# Patient Record
Sex: Male | Born: 1935 | Race: White | Hispanic: No | Smoking: Never smoker
Health system: Southern US, Community
[De-identification: ages and names within clinical notes are randomized; demographics above are authoritative.]

## PROBLEM LIST (undated history)

## (undated) DIAGNOSIS — C4491 Basal cell carcinoma of skin, unspecified: Secondary | ICD-10-CM

## (undated) DIAGNOSIS — E785 Hyperlipidemia, unspecified: Secondary | ICD-10-CM

## (undated) DIAGNOSIS — I4891 Unspecified atrial fibrillation: Secondary | ICD-10-CM

## (undated) DIAGNOSIS — E119 Type 2 diabetes mellitus without complications: Secondary | ICD-10-CM

## (undated) DIAGNOSIS — Z951 Presence of aortocoronary bypass graft: Secondary | ICD-10-CM

## (undated) DIAGNOSIS — I5030 Unspecified diastolic (congestive) heart failure: Secondary | ICD-10-CM

## (undated) DIAGNOSIS — I2510 Atherosclerotic heart disease of native coronary artery without angina pectoris: Secondary | ICD-10-CM

## (undated) DIAGNOSIS — I10 Essential (primary) hypertension: Secondary | ICD-10-CM

## (undated) DIAGNOSIS — C801 Malignant (primary) neoplasm, unspecified: Secondary | ICD-10-CM

## (undated) DIAGNOSIS — H9190 Unspecified hearing loss, unspecified ear: Secondary | ICD-10-CM

## (undated) DIAGNOSIS — Z973 Presence of spectacles and contact lenses: Secondary | ICD-10-CM

## (undated) DIAGNOSIS — I219 Acute myocardial infarction, unspecified: Secondary | ICD-10-CM

## (undated) HISTORY — PX: CORONARY ARTERY BYPASS GRAFT: SHX141

---

## 1999-02-25 DIAGNOSIS — I5030 Unspecified diastolic (congestive) heart failure: Secondary | ICD-10-CM

## 1999-02-25 HISTORY — DX: Unspecified diastolic (congestive) heart failure: I50.30

## 1999-04-29 ENCOUNTER — Inpatient Hospital Stay (HOSPITAL_COMMUNITY): Admission: EM | Admit: 1999-04-29 | Discharge: 1999-05-04 | Payer: Self-pay | Admitting: *Deleted

## 1999-04-29 ENCOUNTER — Encounter: Payer: Self-pay | Admitting: Thoracic Surgery (Cardiothoracic Vascular Surgery)

## 1999-04-30 ENCOUNTER — Encounter: Payer: Self-pay | Admitting: Thoracic Surgery (Cardiothoracic Vascular Surgery)

## 1999-05-01 ENCOUNTER — Encounter: Payer: Self-pay | Admitting: Thoracic Surgery (Cardiothoracic Vascular Surgery)

## 1999-05-02 ENCOUNTER — Encounter: Payer: Self-pay | Admitting: Thoracic Surgery (Cardiothoracic Vascular Surgery)

## 2000-02-25 DIAGNOSIS — Z951 Presence of aortocoronary bypass graft: Secondary | ICD-10-CM

## 2000-02-25 DIAGNOSIS — I2510 Atherosclerotic heart disease of native coronary artery without angina pectoris: Secondary | ICD-10-CM

## 2000-02-25 HISTORY — DX: Presence of aortocoronary bypass graft: Z95.1

## 2000-02-25 HISTORY — DX: Atherosclerotic heart disease of native coronary artery without angina pectoris: I25.10

## 2005-05-05 ENCOUNTER — Inpatient Hospital Stay: Payer: Self-pay | Admitting: Internal Medicine

## 2005-05-05 ENCOUNTER — Other Ambulatory Visit: Payer: Self-pay

## 2010-05-26 ENCOUNTER — Ambulatory Visit: Payer: Self-pay | Admitting: Internal Medicine

## 2010-06-07 ENCOUNTER — Inpatient Hospital Stay: Payer: Self-pay | Admitting: Surgery

## 2010-06-12 LAB — PSA

## 2010-06-12 LAB — CEA: CEA: 1.1 ng/mL (ref 0.0–4.7)

## 2010-06-16 LAB — PATHOLOGY REPORT

## 2010-06-20 ENCOUNTER — Ambulatory Visit: Payer: Self-pay | Admitting: Internal Medicine

## 2010-06-25 ENCOUNTER — Ambulatory Visit: Payer: Self-pay | Admitting: Internal Medicine

## 2010-07-26 ENCOUNTER — Ambulatory Visit: Payer: Self-pay | Admitting: Internal Medicine

## 2010-08-25 ENCOUNTER — Ambulatory Visit: Payer: Self-pay | Admitting: Internal Medicine

## 2010-09-25 ENCOUNTER — Ambulatory Visit: Payer: Self-pay | Admitting: Internal Medicine

## 2010-10-25 ENCOUNTER — Ambulatory Visit: Payer: Self-pay | Admitting: Internal Medicine

## 2010-10-26 ENCOUNTER — Ambulatory Visit: Payer: Self-pay | Admitting: Internal Medicine

## 2010-11-25 ENCOUNTER — Ambulatory Visit: Payer: Self-pay | Admitting: Internal Medicine

## 2010-12-26 ENCOUNTER — Ambulatory Visit: Payer: Self-pay | Admitting: Internal Medicine

## 2011-01-25 ENCOUNTER — Ambulatory Visit: Payer: Self-pay | Admitting: Internal Medicine

## 2011-02-03 ENCOUNTER — Ambulatory Visit: Payer: Self-pay | Admitting: Family Medicine

## 2011-02-10 ENCOUNTER — Inpatient Hospital Stay: Payer: Self-pay | Admitting: Surgery

## 2011-02-25 ENCOUNTER — Ambulatory Visit: Payer: Self-pay | Admitting: Internal Medicine

## 2011-03-07 ENCOUNTER — Ambulatory Visit: Payer: Self-pay | Admitting: Internal Medicine

## 2011-03-07 LAB — HEPATIC FUNCTION PANEL A (ARMC)
Bilirubin, Direct: 0.1 mg/dL (ref 0.00–0.20)
Bilirubin,Total: 0.3 mg/dL (ref 0.2–1.0)
SGOT(AST): 34 U/L (ref 15–37)
SGPT (ALT): 33 U/L
Total Protein: 6.4 g/dL (ref 6.4–8.2)

## 2011-03-07 LAB — CREATININE, SERUM
Creatinine: 0.98 mg/dL (ref 0.60–1.30)
EGFR (African American): >60
EGFR (Non-African Amer.): >60

## 2011-03-07 LAB — CBC CANCER CENTER
Basophil %: 0.4 %
Eosinophil #: 0.5 x10 3/mm (ref 0.0–0.7)
Eosinophil %: 8.4 %
HCT: 35.3 % — ABNORMAL LOW (ref 40.0–52.0)
MCH: 28.6 pg (ref 26.0–34.0)
MCHC: 33.9 g/dL (ref 32.0–36.0)
MCV: 85 fL (ref 80–100)
Monocyte #: 0.6 x10 3/mm (ref 0.0–0.7)
Neutrophil #: 2 x10 3/mm (ref 1.4–6.5)
Neutrophil %: 32.5 %
Platelet: 276 x10 3/mm (ref 150–440)

## 2011-03-07 LAB — LACTATE DEHYDROGENASE: LDH: 165 U/L (ref 87–241)

## 2011-03-28 ENCOUNTER — Ambulatory Visit: Payer: Self-pay | Admitting: Internal Medicine

## 2011-04-25 ENCOUNTER — Ambulatory Visit: Payer: Self-pay | Admitting: Internal Medicine

## 2011-05-30 ENCOUNTER — Ambulatory Visit: Payer: Self-pay | Admitting: Internal Medicine

## 2011-05-30 LAB — CREATININE, SERUM: Creatinine: 1.03 mg/dL (ref 0.60–1.30)

## 2011-05-30 LAB — HEPATIC FUNCTION PANEL A (ARMC)
Albumin: 3.6 g/dL (ref 3.4–5.0)
Alkaline Phosphatase: 72 U/L (ref 50–136)
Bilirubin, Direct: 0 mg/dL (ref 0.00–0.20)
Bilirubin,Total: 0.2 mg/dL (ref 0.2–1.0)
SGPT (ALT): 25 U/L
Total Protein: 6.7 g/dL (ref 6.4–8.2)

## 2011-05-30 LAB — CBC CANCER CENTER
Eosinophil #: 0.2 x10 3/mm (ref 0.0–0.7)
Eosinophil %: 4.2 %
HGB: 13.1 g/dL (ref 13.0–18.0)
MCH: 30.1 pg (ref 26.0–34.0)
MCHC: 34.5 g/dL (ref 32.0–36.0)
Monocyte #: 0.5 x10 3/mm (ref 0.0–0.7)
Neutrophil #: 1.8 x10 3/mm (ref 1.4–6.5)
Platelet: 192 x10 3/mm (ref 150–440)
RDW: 13.2 % (ref 11.5–14.5)

## 2011-05-30 LAB — LACTATE DEHYDROGENASE: LDH: 219 U/L (ref 87–241)

## 2011-06-25 ENCOUNTER — Ambulatory Visit: Payer: Self-pay | Admitting: Internal Medicine

## 2011-07-23 LAB — BASIC METABOLIC PANEL
Anion Gap: 10 (ref 7–16)
BUN: 11 mg/dL (ref 7–18)
Co2: 26 mmol/L (ref 21–32)
EGFR (African American): >60
EGFR (Non-African Amer.): >60
Osmolality: 284 (ref 275–301)
Potassium: 4.3 mmol/L (ref 3.5–5.1)
Sodium: 141 mmol/L (ref 136–145)

## 2011-07-23 LAB — CBC CANCER CENTER
HGB: 13.1 g/dL (ref 13.0–18.0)
Lymphocyte #: 3.6 x10 3/mm (ref 1.0–3.6)
MCH: 29.9 pg (ref 26.0–34.0)
MCHC: 33.5 g/dL (ref 32.0–36.0)
MCV: 89 fL (ref 80–100)
Monocyte #: 0.5 x10 3/mm (ref 0.2–1.0)
Monocyte %: 6.4 %
Neutrophil #: 2.6 x10 3/mm (ref 1.4–6.5)
Platelet: 219 x10 3/mm (ref 150–440)
RBC: 4.38 10*6/uL — ABNORMAL LOW (ref 4.40–5.90)
WBC: 7.2 x10 3/mm (ref 3.8–10.6)

## 2011-07-26 ENCOUNTER — Ambulatory Visit: Payer: Self-pay | Admitting: Internal Medicine

## 2011-08-25 ENCOUNTER — Ambulatory Visit: Payer: Self-pay | Admitting: Internal Medicine

## 2011-10-03 ENCOUNTER — Ambulatory Visit: Payer: Self-pay | Admitting: Internal Medicine

## 2011-10-03 LAB — CBC CANCER CENTER
Basophil #: 0 x10 3/mm (ref 0.0–0.1)
Basophil %: 0.3 %
Eosinophil #: 0.2 x10 3/mm (ref 0.0–0.7)
Eosinophil %: 3.6 %
HCT: 36.2 % — ABNORMAL LOW (ref 40.0–52.0)
HGB: 12.2 g/dL — ABNORMAL LOW (ref 13.0–18.0)
Lymphocyte #: 2.6 x10 3/mm (ref 1.0–3.6)
Lymphocyte %: 39.3 %
MCH: 30.3 pg (ref 26.0–34.0)
MCHC: 33.8 g/dL (ref 32.0–36.0)
MCV: 90 fL (ref 80–100)
Monocyte #: 0.5 x10 3/mm (ref 0.2–1.0)
Monocyte %: 7.9 %
Neutrophil #: 3.2 x10 3/mm (ref 1.4–6.5)
Neutrophil %: 48.9 %
Platelet: 197 x10 3/mm (ref 150–440)
RBC: 4.03 10*6/uL — ABNORMAL LOW (ref 4.40–5.90)
RDW: 13.4 % (ref 11.5–14.5)
WBC: 6.6 x10 3/mm (ref 3.8–10.6)

## 2011-10-03 LAB — HEPATIC FUNCTION PANEL A (ARMC)
Albumin: 3.4 g/dL (ref 3.4–5.0)
Alkaline Phosphatase: 65 U/L (ref 50–136)
Bilirubin, Direct: <0.1 mg/dL (ref 0.00–0.20)
Bilirubin,Total: 0.2 mg/dL (ref 0.2–1.0)
SGOT(AST): 24 U/L (ref 15–37)
SGPT (ALT): 27 U/L (ref 12–78)
Total Protein: 6.7 g/dL (ref 6.4–8.2)

## 2011-10-03 LAB — LACTATE DEHYDROGENASE: LDH: 160 U/L (ref 81–234)

## 2011-10-03 LAB — CREATININE, SERUM
Creatinine: 0.98 mg/dL (ref 0.60–1.30)
EGFR (African American): >60
EGFR (Non-African Amer.): >60

## 2011-10-26 ENCOUNTER — Ambulatory Visit: Payer: Self-pay | Admitting: Internal Medicine

## 2011-11-25 ENCOUNTER — Ambulatory Visit: Payer: Self-pay | Admitting: Internal Medicine

## 2011-12-26 ENCOUNTER — Ambulatory Visit: Payer: Self-pay | Admitting: Internal Medicine

## 2012-01-25 ENCOUNTER — Ambulatory Visit: Payer: Self-pay | Admitting: Internal Medicine

## 2012-02-02 DIAGNOSIS — Z23 Encounter for immunization: Secondary | ICD-10-CM | POA: Insufficient documentation

## 2012-02-02 DIAGNOSIS — R300 Dysuria: Secondary | ICD-10-CM | POA: Insufficient documentation

## 2012-02-02 DIAGNOSIS — R399 Unspecified symptoms and signs involving the genitourinary system: Secondary | ICD-10-CM | POA: Insufficient documentation

## 2012-02-02 DIAGNOSIS — N401 Enlarged prostate with lower urinary tract symptoms: Secondary | ICD-10-CM | POA: Insufficient documentation

## 2012-02-02 DIAGNOSIS — R351 Nocturia: Secondary | ICD-10-CM | POA: Insufficient documentation

## 2012-02-02 DIAGNOSIS — R339 Retention of urine, unspecified: Secondary | ICD-10-CM | POA: Insufficient documentation

## 2012-02-02 DIAGNOSIS — N138 Other obstructive and reflux uropathy: Secondary | ICD-10-CM

## 2012-02-06 LAB — CREATININE, SERUM
Creatinine: 1.14 mg/dL (ref 0.60–1.30)
EGFR (African American): >60
EGFR (Non-African Amer.): 60 — ABNORMAL LOW

## 2012-02-06 LAB — HEPATIC FUNCTION PANEL A (ARMC)
Alkaline Phosphatase: 75 U/L (ref 50–136)
SGOT(AST): 24 U/L (ref 15–37)
SGPT (ALT): 31 U/L (ref 12–78)

## 2012-02-06 LAB — CBC CANCER CENTER
Basophil #: 0 x10 3/mm (ref 0.0–0.1)
Eosinophil #: 0.3 x10 3/mm (ref 0.0–0.7)
HCT: 36.2 % — ABNORMAL LOW (ref 40.0–52.0)
Lymphocyte #: 2.7 x10 3/mm (ref 1.0–3.6)
MCHC: 34.7 g/dL (ref 32.0–36.0)
MCV: 89 fL (ref 80–100)
Monocyte #: 0.5 x10 3/mm (ref 0.2–1.0)
Neutrophil %: 45.5 %
Platelet: 187 x10 3/mm (ref 150–440)
RBC: 4.08 10*6/uL — ABNORMAL LOW (ref 4.40–5.90)
RDW: 13.2 % (ref 11.5–14.5)
WBC: 6.5 x10 3/mm (ref 3.8–10.6)

## 2012-02-06 LAB — LACTATE DEHYDROGENASE: LDH: 167 U/L (ref 85–241)

## 2012-02-25 ENCOUNTER — Ambulatory Visit: Payer: Self-pay | Admitting: Internal Medicine

## 2012-03-27 ENCOUNTER — Ambulatory Visit: Payer: Self-pay | Admitting: Internal Medicine

## 2012-04-30 ENCOUNTER — Ambulatory Visit: Payer: Self-pay | Admitting: Internal Medicine

## 2012-05-10 ENCOUNTER — Encounter: Payer: Self-pay | Admitting: Family Medicine

## 2012-05-10 LAB — HEMOGLOBIN A1C
A1c: 7.6
Weight: 238

## 2012-05-25 ENCOUNTER — Ambulatory Visit: Payer: Self-pay | Admitting: Internal Medicine

## 2012-06-24 ENCOUNTER — Ambulatory Visit: Payer: Self-pay | Admitting: Internal Medicine

## 2012-07-23 LAB — CBC CANCER CENTER
Basophil #: 0 x10 3/mm (ref 0.0–0.1)
Basophil %: 0.5 %
Eosinophil %: 4.4 %
HCT: 38 % — ABNORMAL LOW (ref 40.0–52.0)
Lymphocyte #: 3.3 x10 3/mm (ref 1.0–3.6)
Lymphocyte %: 46.7 %
MCH: 30.8 pg (ref 26.0–34.0)
Monocyte %: 8.5 %
Platelet: 182 x10 3/mm (ref 150–440)
RBC: 4.3 10*6/uL — ABNORMAL LOW (ref 4.40–5.90)
WBC: 7.2 x10 3/mm (ref 3.8–10.6)

## 2012-07-23 LAB — HEPATIC FUNCTION PANEL A (ARMC)
Alkaline Phosphatase: 69 U/L (ref 50–136)
Bilirubin, Direct: 0.1 mg/dL (ref 0.00–0.20)
Bilirubin,Total: 0.3 mg/dL (ref 0.2–1.0)
SGOT(AST): 26 U/L (ref 15–37)
Total Protein: 6.9 g/dL (ref 6.4–8.2)

## 2012-07-23 LAB — LACTATE DEHYDROGENASE: LDH: 170 U/L

## 2012-07-23 LAB — CREATININE, SERUM
Creatinine: 1.22 mg/dL
EGFR (African American): >60
EGFR (Non-African Amer.): 55 — ABNORMAL LOW

## 2012-07-25 ENCOUNTER — Ambulatory Visit: Payer: Self-pay | Admitting: Internal Medicine

## 2012-08-09 ENCOUNTER — Ambulatory Visit: Payer: Self-pay | Admitting: Surgery

## 2012-08-09 LAB — CBC WITH DIFFERENTIAL/PLATELET
Basophil %: 0.7 %
HCT: 38.7 % — ABNORMAL LOW (ref 40.0–52.0)
HGB: 13.6 g/dL (ref 13.0–18.0)
Lymphocyte #: 2.3 10*3/uL (ref 1.0–3.6)
MCH: 30.7 pg (ref 26.0–34.0)
MCV: 88 fL (ref 80–100)
Monocyte #: 0.5 x10 3/mm (ref 0.2–1.0)
RDW: 13 % (ref 11.5–14.5)

## 2012-08-09 LAB — BASIC METABOLIC PANEL
BUN: 17 mg/dL (ref 7–18)
Co2: 26 mmol/L (ref 21–32)
Creatinine: 0.92 mg/dL (ref 0.60–1.30)
EGFR (Non-African Amer.): >60

## 2012-08-09 LAB — PROTIME-INR: Prothrombin Time: 12.7 secs (ref 11.5–14.7)

## 2012-08-16 ENCOUNTER — Ambulatory Visit: Payer: Self-pay | Admitting: Surgery

## 2013-01-24 ENCOUNTER — Ambulatory Visit: Payer: Self-pay | Admitting: Internal Medicine

## 2013-01-24 LAB — CBC CANCER CENTER
Basophil #: 0.1 x10 3/mm (ref 0.0–0.1)
Basophil %: 1 %
Eosinophil #: 0.3 x10 3/mm (ref 0.0–0.7)
Eosinophil %: 5 %
HGB: 13.7 g/dL (ref 13.0–18.0)
Lymphocyte #: 2.7 x10 3/mm (ref 1.0–3.6)
MCH: 30.8 pg (ref 26.0–34.0)
MCHC: 34.1 g/dL (ref 32.0–36.0)
Monocyte %: 6.6 %
Neutrophil #: 2.7 x10 3/mm (ref 1.4–6.5)
Neutrophil %: 43.3 %
Platelet: 207 x10 3/mm (ref 150–440)
RBC: 4.46 10*6/uL (ref 4.40–5.90)
WBC: 6.1 x10 3/mm (ref 3.8–10.6)

## 2013-01-24 LAB — HEPATIC FUNCTION PANEL A (ARMC)
Bilirubin,Total: 0.3 mg/dL (ref 0.2–1.0)
SGOT(AST): 19 U/L (ref 15–37)
SGPT (ALT): 29 U/L (ref 12–78)

## 2013-01-24 LAB — LACTATE DEHYDROGENASE: LDH: 141 U/L (ref 85–241)

## 2013-02-24 ENCOUNTER — Ambulatory Visit: Payer: Self-pay | Admitting: Internal Medicine

## 2013-06-01 IMAGING — CT CT CHEST-ABD-PELV W/ CM
1 of 3 series · 13 of 30 positions shown, 18 images · non-contrast
Comparison: none

REASON FOR EXAM: (1) lymphoma, evaluate for recurrence; (2) lymphoma,
evaluate for recurrence
COMMENTS:

[Series 2: soft tissue · axial · 0.89mm/px · z∈[-770,-190]mm · 13 of 134 slices shown, 18 images]
[im 9/134  mediastinal]
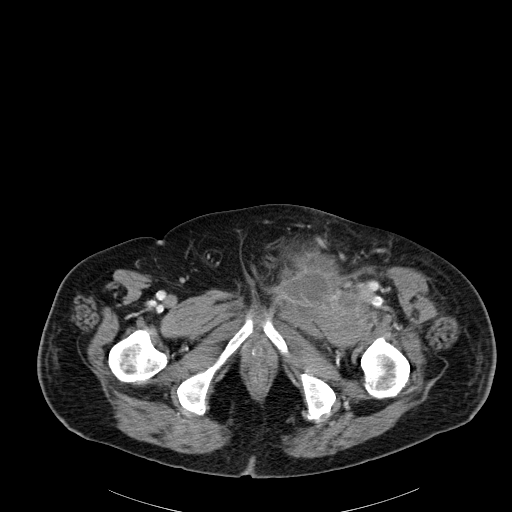
[im 9/134  bone]
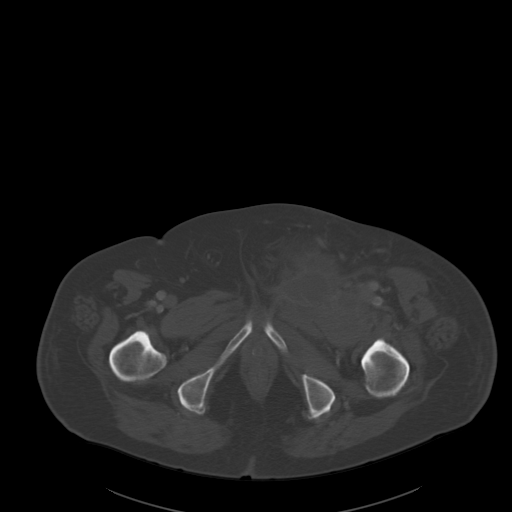
[im 27/134  mediastinal]
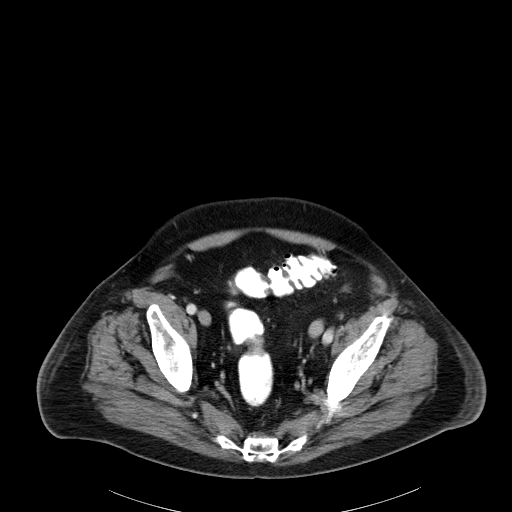
[im 36/134  mediastinal]
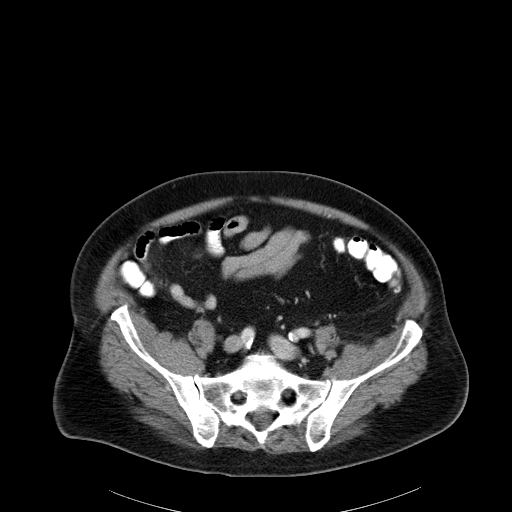
[im 45/134  mediastinal]
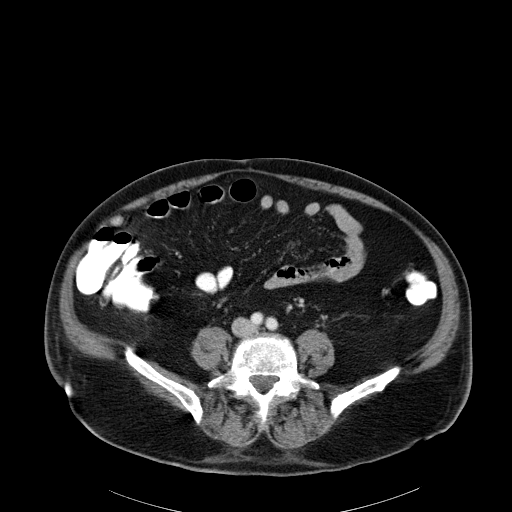
[im 54/134  mediastinal]
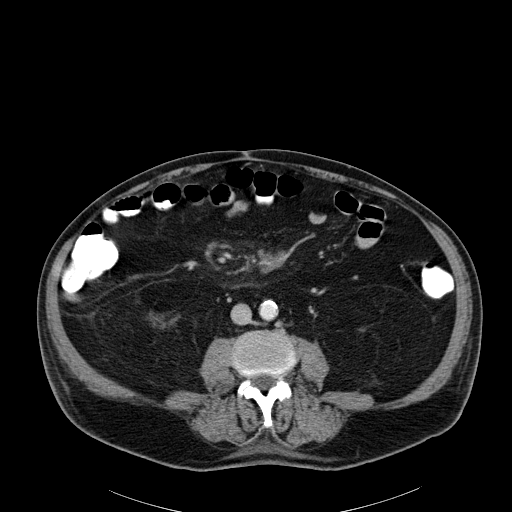
[im 63/134  mediastinal]
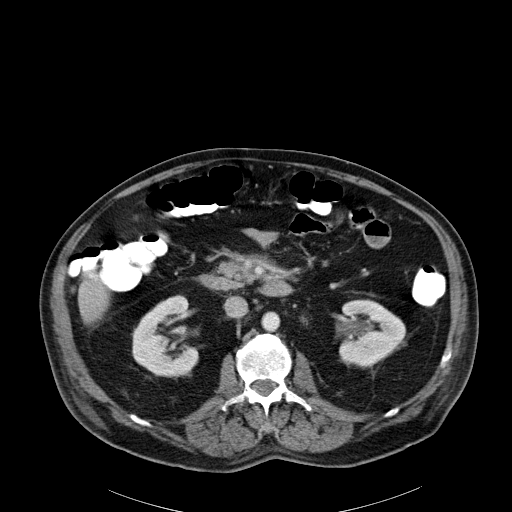
[im 67/134  mediastinal]
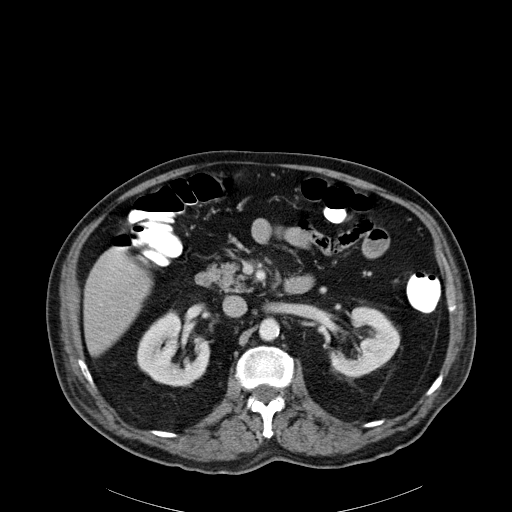
[im 80/134  mediastinal]
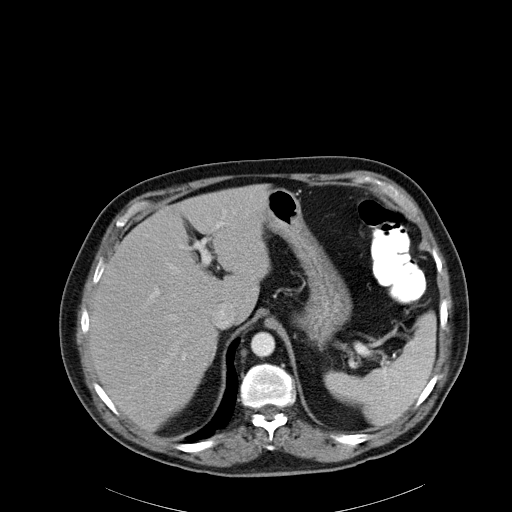
[im 89/134  mediastinal]
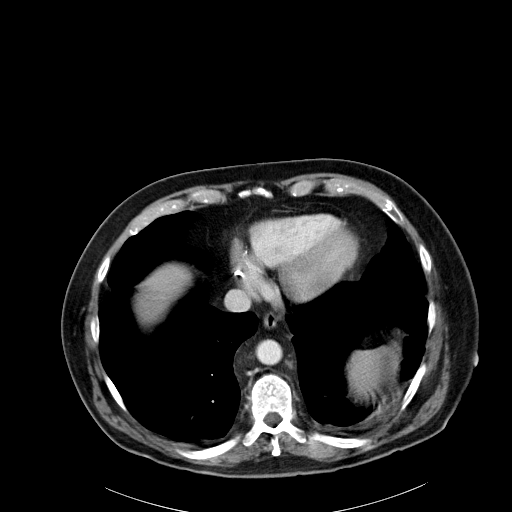
[im 89/134  bone]
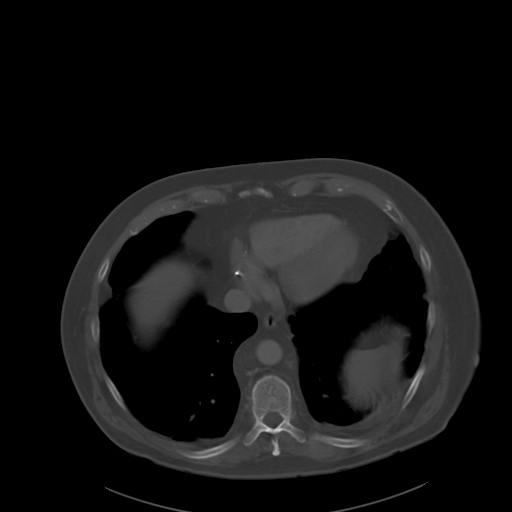
[im 98/134  mediastinal]
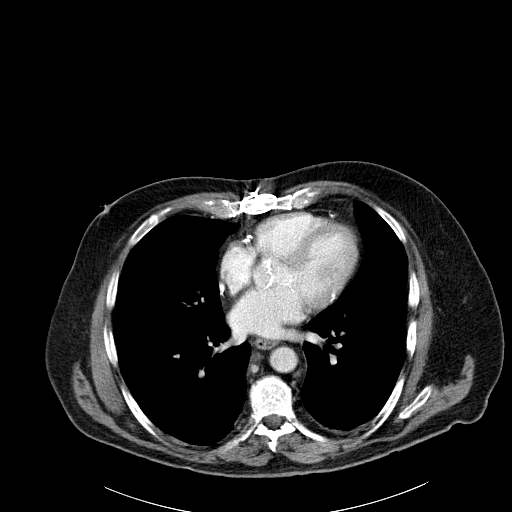
[im 98/134  lung]
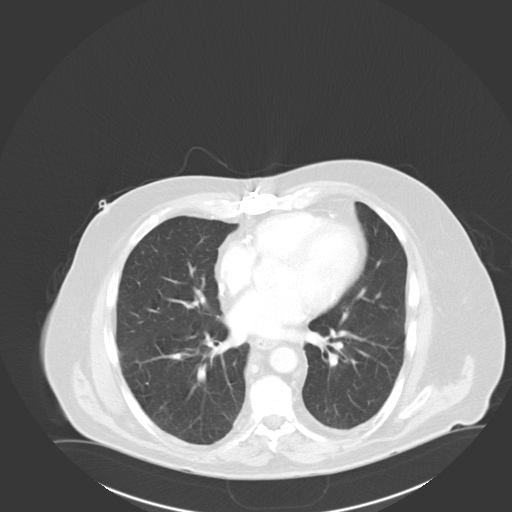
[im 107/134  lung]
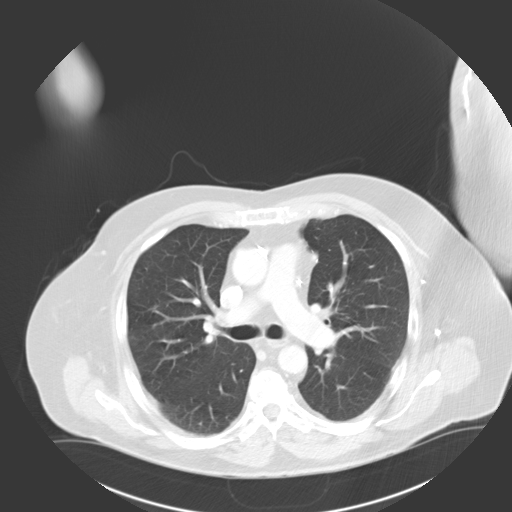
[im 116/134  mediastinal]
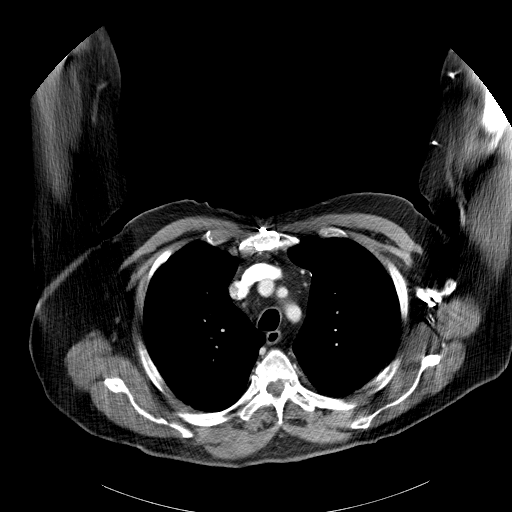
[im 116/134  lung]
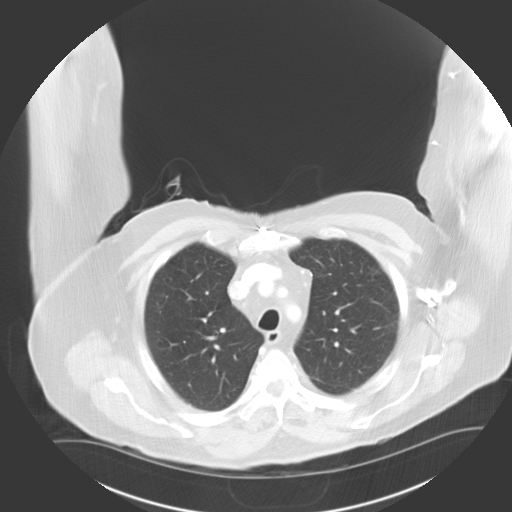
[im 125/134  mediastinal]
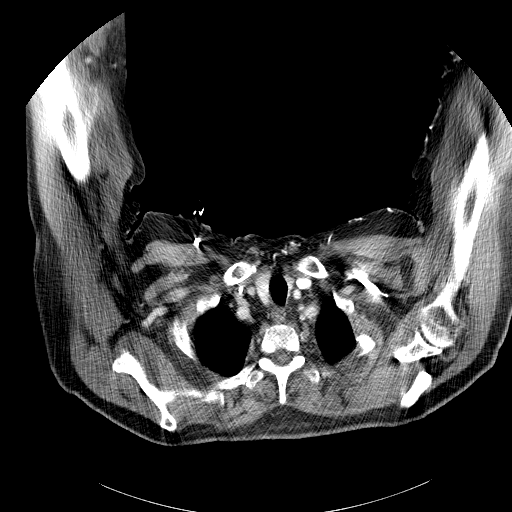
[im 125/134  lung]
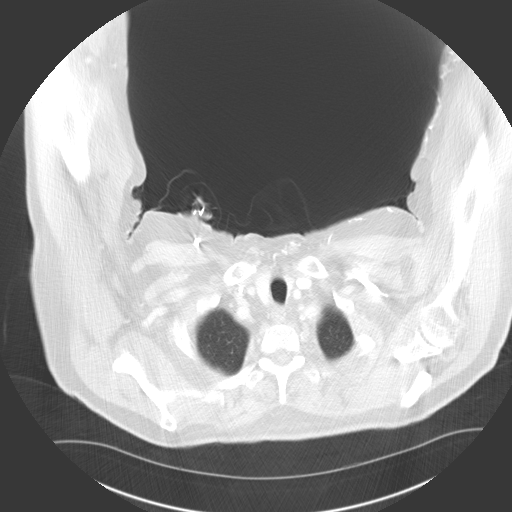

[13 of 30 positions shown; findings below may reference images not displayed]

PROCEDURE:     CT  - CT CHEST ABDOMEN AND PELVIS W  - February 11, 2011 [DATE]

RESULT:     Axial CT scanning was performed through the chest, abdomen, and
pelvis with reconstructions at 5 mm intervals and slice thicknesses. The
patient has a history of lymphoma. The patient received 100 cc of Ysovue-RLR
as well as oral contrast material. Comparison is made to an abdominal CT
scan dated 21 August, 2010. Review of multiplanar reconstructed images was
performed separately on the VIA monitor.

CT scan of the chest: The cardiac chambers are normal in size. The caliber
of the thoracic aorta is normal. I see no pathologic sized mediastinal or
hilar lymph nodes. There is no pleural nor pericardial effusion. At lung
window settings there is a small amount of pleural fluid versus pleural
thickening in the posterior costophrenic gutters bilaterally. I see no
alveolar infiltrates. No pulmonary parenchymal masses are demonstrated. The
thoracic vertebral bodies are preserved in height. The patient has undergone
previous median sternotomy.
CONCLUSION: I do not see evidence of mediastinal nor hilar lymphadenopathy
nor other findings to suggest active lymphoma within the thorax. There is a
small amount of pleural thickening versus pleural fluid [place] posteriorly in
the lower hemithoraces bilaterally.

CT scan of the abdomen and pelvis:

There remains increased density surrounding the SMA and SMV approximately 5
cm distal to their origins. This is consistent with a site of previous
lymphadenopathy. It is smaller than on the previous study measuring 5.7 cm
transversely by 2.2 cm AP by 6.8 cm in superior to inferior dimension.

The gallbladder is partially distended and contains radiodense stones. The
liver, spleen, pancreas, nondistended stomach, adrenal glands, duodenum, and
kidneys exhibit no acute abnormality. There is a extrarenal pelvis on the
left which is stable. There are likely parapelvic cysts on the left which
also are stable. The partially opacified small and large bowel loops exhibit
no acute abnormality. Within the pelvis the partially distended urinary
bladder is normal in appearance. The prostate gland produces a moderate
impression upon the urinary bladder base. I seen no pelvic lymphadenopathy.
However, there is a large bulky left inguinal mass which is distorting the
the muscle and fascial planes on the left. It extends to the inferior aspect
of the scan. This mass measures at least 11 cm AP by approximately 11 cm
transversely. It contains multiple septations. The Hounsfield measurement of
the lower density portions proximally 29 while the higher density portions
are proximally 59.
IMPRESSION: 1. There is a large multiseptated left inguinal and upper thigh mass which
is worrisome for recurrent lymphoma. It may be complicated by either
lymphedema or prior hemorrhage.
2. The previously described retroperitoneal focus of increased soft tissue
density surrounding the central mesenteric vessels has decreased in size but
remains abnormal.
3. There are gallstones present.
4. Within the thorax I do not see evidence of active lymphoma.

## 2013-09-30 ENCOUNTER — Ambulatory Visit: Payer: Self-pay | Admitting: Internal Medicine

## 2013-09-30 LAB — CBC CANCER CENTER
BASOS ABS: 0.1 x10 3/mm (ref 0.0–0.1)
BASOS PCT: 0.9 %
Eosinophil #: 0.4 x10 3/mm (ref 0.0–0.7)
Eosinophil %: 6.1 %
HCT: 42.3 % (ref 40.0–52.0)
HGB: 14.3 g/dL (ref 13.0–18.0)
LYMPHS ABS: 3.1 x10 3/mm (ref 1.0–3.6)
Lymphocyte %: 41.9 %
MCH: 30.6 pg (ref 26.0–34.0)
MCHC: 33.8 g/dL (ref 32.0–36.0)
MCV: 91 fL (ref 80–100)
MONOS PCT: 6.9 %
Monocyte #: 0.5 x10 3/mm (ref 0.2–1.0)
Neutrophil #: 3.3 x10 3/mm (ref 1.4–6.5)
Neutrophil %: 44.2 %
PLATELETS: 209 x10 3/mm (ref 150–440)
RBC: 4.67 10*6/uL (ref 4.40–5.90)
RDW: 13.1 % (ref 11.5–14.5)
WBC: 7.4 x10 3/mm (ref 3.8–10.6)

## 2013-09-30 LAB — CREATININE, SERUM
Creatinine: 1.28 mg/dL (ref 0.60–1.30)
EGFR (African American): 60 — ABNORMAL LOW
EGFR (Non-African Amer.): 51 — ABNORMAL LOW

## 2013-09-30 LAB — HEPATIC FUNCTION PANEL A (ARMC)
ALK PHOS: 62 U/L
Albumin: 3.5 g/dL (ref 3.4–5.0)
Bilirubin, Direct: <0.1 mg/dL (ref 0.00–0.20)
Bilirubin,Total: 0.3 mg/dL (ref 0.2–1.0)
SGOT(AST): 16 U/L (ref 15–37)
SGPT (ALT): 36 U/L
Total Protein: 6.9 g/dL (ref 6.4–8.2)

## 2013-09-30 LAB — LACTATE DEHYDROGENASE: LDH: 168 U/L (ref 85–241)

## 2013-10-25 ENCOUNTER — Ambulatory Visit: Payer: Self-pay | Admitting: Internal Medicine

## 2014-03-22 DIAGNOSIS — I2510 Atherosclerotic heart disease of native coronary artery without angina pectoris: Secondary | ICD-10-CM | POA: Insufficient documentation

## 2014-06-16 NOTE — Op Note (Signed)
PATIENT NAME:  Joseph Flowers, Joseph Flowers MR#:  [id number] DATE OF BIRTH:  10/11/1927  DATE OF PROCEDURE:  08/16/2012  PREOPERATIVE DIAGNOSIS: Lymphoma, in remission.   POSTOPERATIVE DIAGNOSIS: Lymphoma, in remission.   PROCEDURE PERFORMED: Removal of right chest Infuse-a-Port.   SURGEON: Sherri Rad, M.D.   ASSISTANT: None.   ANESTHESIA: 30 mL of local anesthesia with monitored anesthesia care.   SPECIMENS: None.   ESTIMATED BLOOD LOSS: None.   DESCRIPTION OF PROCEDURE: With informed consent, supine position, percutaneous monitoring lines placed, the patient's right chest was sterilely prepped and draped with ChloraPrep solution. Timeout was observed.   A total of 30 mL of local anesthesia was infiltrated along the intended incision site over the right chest.  After this was accomplished, the previous incision was opened sharply with scalpel and carried down with sharp dissection to the fibrinous capsule around the Port-A-Cath. This was incised with a scalpel. The 2 PDS sutures were removed. The catheter was then removed in its entirety with the patient in reverse Trendelenburg position. Direct pressure was placed on the internal jugular vein for several minutes. There were no active signs of bleeding.   The fibrous sac was then excised in its entirety with electrocautery and discarded. The wound was then closed in multiple layers of 3-0 Vicryl, 4-0 Vicryl subcuticular in the skin, benzoin, Steri-Strips, Telfa and Tegaderm. The patient was then taken to the recovery room in stable and satisfactory condition by anesthesia services. ____________________________ Jeannette How Marina Gravel, MD mab:sb D: 08/16/2012 08:37:15 ET T: 08/16/2012 09:08:30 ET JOB#: [id number]  cc: Elta Guadeloupe A. Marina Gravel, MD, <Dictator> Sandeep R. Ma Hillock, MD Dianah Field Mable Fill, MD  Hortencia Conradi MD ELECTRONICALLY SIGNED 08/16/2012 13:11

## 2014-06-18 NOTE — Discharge Summary (Signed)
PATIENT NAME:  Joseph Flowers, Joseph Flowers MR#:  [id number] DATE OF BIRTH:  Feb 17, 1928  DATE OF ADMISSION:  02/10/2011 DATE OF DISCHARGE:  02/17/2011  FINAL DIAGNOSES:  1. History of non-Hodgkin's lymphoma.  2. Left groin mass consistent with abscess.   PRINCIPLE PROCEDURES: CT scan of the pelvis.    CONSULTATIONS:  1. Oncological consultation featuring Dr. Ma Hillock.  2. Surgical consultation featuring [place]: The patient was admitted with pain and swelling of his left lower extremity. Presumptive diagnosis was made of deep vein thrombosis. He was placed on Coumadin. The patient was then seen by Oncology for history of non-Hodgkin's lymphoma. Surgery was consulted regarding biopsy of possible left groin mass. He was taken to the Operating Room on the 19th at which point a large left groin abscess was drained and a Penrose drain was placed. The patient had near immediate relief of his symptomatology. On postoperative day number two the patient was having less groin pain. He was continued on intravenous antibiotics. On postoperative day number two the patient was feeling much better with diminished drainage. Coumadin was restarted. On the 23rd he continued to improve, tolerating a regular diet. On the 24th the patient was discharged to home with follow-up in our office in 1 to 2 weeks.   ____________________________ Jeannette How Marina Gravel, MD mab:rbg D: 02/28/2011 05:49:50 ET T: 02/28/2011 16:16:12 ET JOB#: [id number]  cc: Elta Guadeloupe A. Marina Gravel, MD, <Dictator> Dianah Field. Mable Fill, MD Hortencia Conradi MD ELECTRONICALLY SIGNED 03/01/2011 13:16

## 2014-08-18 ENCOUNTER — Inpatient Hospital Stay
Admission: EM | Admit: 2014-08-18 | Discharge: 2014-08-23 | DRG: 390 | Disposition: A | Discharge status: Home / Self Care | Payer: Medicare Other | Attending: Surgery | Admitting: Surgery

## 2014-08-18 ENCOUNTER — Encounter: Payer: Self-pay | Admitting: Emergency Medicine

## 2014-08-18 ENCOUNTER — Emergency Department: Payer: Medicare Other

## 2014-08-18 DIAGNOSIS — R1084 Generalized abdominal pain: Secondary | ICD-10-CM

## 2014-08-18 DIAGNOSIS — K8020 Calculus of gallbladder without cholecystitis without obstruction: Secondary | ICD-10-CM | POA: Diagnosis present

## 2014-08-18 DIAGNOSIS — E785 Hyperlipidemia, unspecified: Secondary | ICD-10-CM | POA: Diagnosis present

## 2014-08-18 DIAGNOSIS — I2510 Atherosclerotic heart disease of native coronary artery without angina pectoris: Secondary | ICD-10-CM | POA: Diagnosis present

## 2014-08-18 DIAGNOSIS — K567 Ileus, unspecified: Secondary | ICD-10-CM

## 2014-08-18 DIAGNOSIS — I10 Essential (primary) hypertension: Secondary | ICD-10-CM | POA: Diagnosis present

## 2014-08-18 DIAGNOSIS — I509 Heart failure, unspecified: Secondary | ICD-10-CM | POA: Diagnosis present

## 2014-08-18 DIAGNOSIS — Z79899 Other long term (current) drug therapy: Secondary | ICD-10-CM

## 2014-08-18 DIAGNOSIS — K565 Intestinal adhesions [bands], unspecified as to partial versus complete obstruction: Secondary | ICD-10-CM | POA: Diagnosis present

## 2014-08-18 DIAGNOSIS — K760 Fatty (change of) liver, not elsewhere classified: Secondary | ICD-10-CM | POA: Diagnosis present

## 2014-08-18 DIAGNOSIS — K5669 Other intestinal obstruction: Secondary | ICD-10-CM | POA: Diagnosis not present

## 2014-08-18 DIAGNOSIS — Z951 Presence of aortocoronary bypass graft: Secondary | ICD-10-CM | POA: Diagnosis not present

## 2014-08-18 DIAGNOSIS — Z7982 Long term (current) use of aspirin: Secondary | ICD-10-CM | POA: Diagnosis not present

## 2014-08-18 DIAGNOSIS — N400 Enlarged prostate without lower urinary tract symptoms: Secondary | ICD-10-CM | POA: Diagnosis present

## 2014-08-18 DIAGNOSIS — E119 Type 2 diabetes mellitus without complications: Secondary | ICD-10-CM | POA: Diagnosis present

## 2014-08-18 DIAGNOSIS — Z8572 Personal history of non-Hodgkin lymphomas: Secondary | ICD-10-CM | POA: Diagnosis not present

## 2014-08-18 DIAGNOSIS — K56609 Unspecified intestinal obstruction, unspecified as to partial versus complete obstruction: Secondary | ICD-10-CM | POA: Insufficient documentation

## 2014-08-18 DIAGNOSIS — Z8249 Family history of ischemic heart disease and other diseases of the circulatory system: Secondary | ICD-10-CM

## 2014-08-18 HISTORY — DX: Type 2 diabetes mellitus without complications: E11.9

## 2014-08-18 HISTORY — DX: Malignant (primary) neoplasm, unspecified: C80.1

## 2014-08-18 HISTORY — DX: Essential (primary) hypertension: I10

## 2014-08-18 HISTORY — DX: Hyperlipidemia, unspecified: E78.5

## 2014-08-18 LAB — COMPREHENSIVE METABOLIC PANEL
ALT: 43 U/L (ref 17–63)
AST: 48 U/L — ABNORMAL HIGH (ref 15–41)
Albumin: 4.1 g/dL (ref 3.5–5.0)
Alkaline Phosphatase: 64 U/L (ref 38–126)
Anion gap: 11 (ref 5–15)
BUN: 14 mg/dL (ref 6–20)
CALCIUM: 9.5 mg/dL (ref 8.9–10.3)
CO2: 27 mmol/L (ref 22–32)
CREATININE: 1.24 mg/dL (ref 0.61–1.24)
Chloride: 97 mmol/L — ABNORMAL LOW (ref 101–111)
GFR, EST AFRICAN AMERICAN: 60 mL/min — AB (ref >60)
GFR, EST NON AFRICAN AMERICAN: 52 mL/min — AB (ref >60)
GLUCOSE: 233 mg/dL — AB (ref 65–99)
Potassium: 4.7 mmol/L (ref 3.5–5.1)
Sodium: 135 mmol/L (ref 135–145)
Total Bilirubin: 1.2 mg/dL (ref 0.3–1.2)
Total Protein: 7.5 g/dL (ref 6.5–8.1)

## 2014-08-18 LAB — CBC WITH DIFFERENTIAL/PLATELET
BASOS ABS: 0.1 10*3/uL (ref 0–0.1)
BASOS PCT: 1 %
Eosinophils Absolute: 0 10*3/uL (ref 0–0.7)
Eosinophils Relative: 0 %
HCT: 44.2 % (ref 40.0–52.0)
Hemoglobin: 15.1 g/dL (ref 13.0–18.0)
Lymphocytes Relative: 13 %
Lymphs Abs: 1.3 10*3/uL (ref 1.0–3.6)
MCH: 30.7 pg (ref 26.0–34.0)
MCHC: 34.1 g/dL (ref 32.0–36.0)
MCV: 90 fL (ref 80.0–100.0)
MONO ABS: 0.6 10*3/uL (ref 0.2–1.0)
Monocytes Relative: 6 %
NEUTROS ABS: 7.6 10*3/uL — AB (ref 1.4–6.5)
NEUTROS PCT: 80 %
Platelets: 239 10*3/uL (ref 150–440)
RBC: 4.92 MIL/uL (ref 4.40–5.90)
RDW: 13.4 % (ref 11.5–14.5)
WBC: 9.5 10*3/uL (ref 3.8–10.6)

## 2014-08-18 LAB — URINALYSIS COMPLETE WITH MICROSCOPIC (ARMC ONLY)
Bilirubin Urine: NEGATIVE
GLUCOSE, UA: 150 mg/dL — AB
HGB URINE DIPSTICK: NEGATIVE
Leukocytes, UA: NEGATIVE
Nitrite: NEGATIVE
PH: 6 (ref 5.0–8.0)
PROTEIN: 30 mg/dL — AB
SPECIFIC GRAVITY, URINE: 1.057 — AB (ref 1.005–1.030)

## 2014-08-18 LAB — PROTIME-INR
INR: 0.98
PROTHROMBIN TIME: 13.2 s (ref 11.4–15.0)

## 2014-08-18 LAB — LIPASE, BLOOD: Lipase: 30 U/L (ref 22–51)

## 2014-08-18 LAB — TROPONIN I

## 2014-08-18 LAB — GLUCOSE, CAPILLARY: Glucose-Capillary: 195 mg/dL — ABNORMAL HIGH (ref 65–99)

## 2014-08-18 LAB — APTT: aPTT: 33 seconds (ref 24–36)

## 2014-08-18 MED ORDER — HEPARIN SODIUM (PORCINE) 5000 UNIT/ML IJ SOLN
5000.0000 [IU] | Freq: Three times a day (TID) | INTRAMUSCULAR | Status: DC
  Administered 2014-08-18 – 2014-08-23 (×15): 5000 [IU] via SUBCUTANEOUS
  Filled 2014-08-18 (×14): qty 1

## 2014-08-18 MED ORDER — ONDANSETRON HCL 4 MG/2ML IJ SOLN: 4.0000 mg | Freq: Four times a day (QID) | INTRAMUSCULAR | Status: DC | PRN

## 2014-08-18 MED ORDER — PHENOL 1.4 % MT LIQD
1.0000 | OROMUCOSAL | Status: DC | PRN
Start: 2014-08-18 — End: 2014-08-23
  Administered 2014-08-21: 1 via OROMUCOSAL
  Filled 2014-08-18: qty 177

## 2014-08-18 MED ORDER — SODIUM CHLORIDE 0.9 % IV SOLN
INTRAVENOUS | Status: DC
  Administered 2014-08-18 – 2014-08-23 (×8): via INTRAVENOUS

## 2014-08-18 MED ORDER — RAMIPRIL 10 MG PO CAPS
10.0000 mg | ORAL_CAPSULE | Freq: Every day | ORAL | Status: DC
  Administered 2014-08-18 – 2014-08-23 (×6): 10 mg via ORAL
  Filled 2014-08-18 (×7): qty 1

## 2014-08-18 MED ORDER — ONDANSETRON HCL 4 MG/2ML IJ SOLN
INTRAMUSCULAR | Status: AC
  Administered 2014-08-18: 4 mg via INTRAVENOUS
  Filled 2014-08-18: qty 2

## 2014-08-18 MED ORDER — ACETAMINOPHEN 325 MG PO TABS: 650.0000 mg | ORAL_TABLET | Freq: Four times a day (QID) | ORAL | Status: DC | PRN

## 2014-08-18 MED ORDER — ACETAMINOPHEN 650 MG RE SUPP: 650.0000 mg | Freq: Four times a day (QID) | RECTAL | Status: DC | PRN

## 2014-08-18 MED ORDER — ONDANSETRON HCL 4 MG/2ML IJ SOLN: 4.0000 mg | Freq: Once | INTRAMUSCULAR | Status: DC

## 2014-08-18 MED ORDER — IOHEXOL 240 MG/ML SOLN
25.0000 mL | Freq: Once | INTRAMUSCULAR | Status: AC | PRN
  Administered 2014-08-18: 25 mL via ORAL

## 2014-08-18 MED ORDER — MORPHINE SULFATE 2 MG/ML IJ SOLN: 2.0000 mg | INTRAMUSCULAR | Status: DC | PRN

## 2014-08-18 MED ORDER — METOPROLOL SUCCINATE ER 50 MG PO TB24
50.0000 mg | ORAL_TABLET | Freq: Every day | ORAL | Status: DC
  Administered 2014-08-18 – 2014-08-23 (×6): 50 mg via ORAL
  Filled 2014-08-18 (×6): qty 1

## 2014-08-18 MED ORDER — ONDANSETRON HCL 4 MG/2ML IJ SOLN
4.0000 mg | Freq: Once | INTRAMUSCULAR | Status: AC
  Administered 2014-08-18: 4 mg via INTRAVENOUS

## 2014-08-18 MED ORDER — FINASTERIDE 5 MG PO TABS
5.0000 mg | ORAL_TABLET | Freq: Every day | ORAL | Status: DC
  Administered 2014-08-19 – 2014-08-23 (×5): 5 mg via ORAL
  Filled 2014-08-18 (×5): qty 1

## 2014-08-18 MED ORDER — INSULIN ASPART 100 UNIT/ML ~~LOC~~ SOLN
0.0000 [IU] | Freq: Three times a day (TID) | SUBCUTANEOUS | Status: DC
  Administered 2014-08-21 – 2014-08-22 (×2): 1 [IU] via SUBCUTANEOUS
  Administered 2014-08-22 – 2014-08-23 (×4): 2 [IU] via SUBCUTANEOUS
  Filled 2014-08-18 (×2): qty 1
  Filled 2014-08-18 (×4): qty 2

## 2014-08-18 MED ORDER — PANTOPRAZOLE SODIUM 40 MG IV SOLR
40.0000 mg | Freq: Every day | INTRAVENOUS | Status: DC
  Administered 2014-08-18 – 2014-08-22 (×5): 40 mg via INTRAVENOUS
  Filled 2014-08-18 (×7): qty 40

## 2014-08-18 MED ORDER — IOHEXOL 300 MG/ML  SOLN
100.0000 mL | Freq: Once | INTRAMUSCULAR | Status: AC | PRN
  Administered 2014-08-18: 100 mL via INTRAVENOUS

## 2014-08-18 NOTE — ED Provider Notes (Signed)
[facility] Emergency Department Provider Note  ____________________________________________  Time seen: Approximately 3:16 PM  I have reviewed the triage vital signs and the nursing notes.   HISTORY  Chief Complaint Emesis    HPI Joseph Flowers is a 81 y.o. male with history of diabetes, hypertension, hyperlipidemia, CHF, history of non-Hodgkin's lymphoma with resection of an abdominal mass approximately 4 years ago who presents for evaluation of intermittent nonbloody nonbilious emesis for the past 5 days. Sudden onset. Currently, symptoms are severe. He is also having abdominal distention but denies any specific pain. He was seen at urgent care and had an x-ray which is concerning for obstruction and was referred to the emergency department. He is not able to pass flatus currently. No fevers, no chest pain or difficulty breathing. No modifying factors.   Past Medical History  Diagnosis Date  . Diabetes mellitus without complication   . Hypertension   . Hyperlipemia   . CHF (congestive heart failure)   . Cancer      lymphoma    There are no active problems to display for this patient.   History reviewed. No pertinent past surgical history.  Current Outpatient Rx  Name  Route  Sig  Dispense  Refill  . aspirin 325 MG tablet   Oral   Take 1 tablet by mouth daily.         . finasteride (PROSCAR) 5 MG tablet   Oral   Take 1 tablet by mouth daily.         . furosemide (LASIX) 20 MG tablet   Oral   Take 1 tablet by mouth daily.         Marland Kitchen glimepiride (AMARYL) 1 MG tablet   Oral   Take 1 tablet by mouth daily.         . metFORMIN (GLUCOPHAGE) 500 MG tablet   Oral   Take 500 mg by mouth daily with breakfast.          . metoprolol succinate (TOPROL-XL) 50 MG 24 hr tablet   Oral   Take 1 tablet by mouth daily.         . ramipril (ALTACE) 10 MG capsule   Oral   Take 1 capsule by mouth daily.         . simvastatin (ZOCOR)  20 MG tablet   Oral   Take 1 tablet by mouth daily.           Allergies Review of patient's allergies indicates no known allergies.  History reviewed. No pertinent family history.  Social History History  Substance Use Topics  . Smoking status: Never Smoker   . Smokeless tobacco: Not on file  . Alcohol Use: No    Review of Systems Constitutional: No fever/chills Eyes: No visual changes. ENT: No sore throat. Cardiovascular: Denies chest pain. Respiratory: Denies shortness of breath. Gastrointestinal: No abdominal pain.  +nausea, + vomiting.  No diarrhea.  No constipation. Genitourinary: Negative for dysuria. Musculoskeletal: Negative for back pain. Skin: Negative for rash. Neurological: Negative for headaches, focal weakness or numbness.  10-point ROS otherwise negative.  ____________________________________________   PHYSICAL EXAM:  VITAL SIGNS: ED Triage Vitals  Enc Vitals Group     BP 08/18/14 1311 148/81 mmHg     Pulse Rate 08/18/14 1311 105     Resp 08/18/14 1311 20     Temp 08/18/14 1311 97.9 F (36.6 C)     Temp Source 08/18/14 1311 Oral     SpO2  08/18/14 1311 95 %     Weight 08/18/14 1311 206 lb (93.441 kg)     Height 08/18/14 1311 5\' 10"  (1.778 m)     Head Cir --      Peak Flow --      Pain Score 08/18/14 1312 2     Pain Loc --      Pain Edu? --      Excl. in [place]? --     Constitutional: Alert and oriented. Nontoxic-appearing but he is actively retching, vomiting [employer]. Eyes: Conjunctivae are normal. PERRL. EOMI. Head: Atraumatic. Nose: No congestion/rhinnorhea. Mouth/Throat: Mucous membranes are moist.  Oropharynx non-erythematous. Neck: No stridor. Cardiovascular: mildly tachycardic rate, regular rhythm. Grossly normal heart sounds.  Good peripheral circulation. Respiratory: Normal respiratory effort.  No retractions. Lungs CTAB. Gastrointestinal: Hyperactive bowel sounds, moderate to severe abdominal distention. No abdominal  bruits. No CVA tenderness. Genitourinary: Deferred Musculoskeletal: No lower extremity tenderness nor edema.  No joint effusions. Neurologic:  Normal speech and language. No gross focal neurologic deficits are appreciated. Speech is normal. No gait instability. Skin:  Skin is warm, dry and intact. No rash noted. Psychiatric: Mood and affect are normal. Speech and behavior are normal.  ____________________________________________   LABS (all labs ordered are listed, but only abnormal results are displayed)  Labs Reviewed  COMPREHENSIVE METABOLIC PANEL - Abnormal; Notable for the following:    Chloride 97 (*)    Glucose, Bld 233 (*)    AST 48 (*)    GFR calc non Af Amer 52 (*)    GFR calc Af Amer 60 (*)    All other components within normal limits  CBC WITH DIFFERENTIAL/PLATELET - Abnormal; Notable for the following:    Neutro Abs 7.6 (*)    All other components within normal limits  LIPASE, BLOOD  TROPONIN I  APTT  PROTIME-INR  URINALYSIS COMPLETEWITH MICROSCOPIC ([facility] ONLY)   ____________________________________________  EKG  ED ECG REPORT I, Joanne Gavel, the attending physician, personally viewed and interpreted this ECG.   Date: 08/18/2014  EKG Time: 13:48  Rate: 96  Rhythm: normal sinus rhythm  Axis: Indeterminate  Intervals:right bundle branch block  ST&T Change: No acute ST segment elevation  ____________________________________________  RADIOLOGY  CT abdomen and pelvis  IMPRESSION: 1. Findings compatible with small bowel obstruction. Transition point is in the right abdomen proximal to the terminal ileum. Finding may be secondary to adhesions. 2. Again noted is abnormal soft tissue within the small bowel mesenteric compatible with treated lymphoma. 3. Aortic atherosclerosis 4. Hepatic steatosis. 5. Gallstones.  ____________________________________________   PROCEDURES  Procedure(s) performed: None  Critical Care performed:  No  ____________________________________________   INITIAL IMPRESSION / ASSESSMENT AND PLAN / ED COURSE  Pertinent labs & imaging results that were available during my care of the patient were reviewed by me and considered in my medical decision making (see chart for details).  Joseph Flowers is a 81 y.o. male with history of diabetes, hypertension, hyperlipidemia, CHF, history of non-Hodgkin's lymphoma with resection of an abdominal mass approximately 4 years ago who presents for evaluation of intermittent nonbloody nonbilious emesis for the past 5 days. On exam, he is mildly tachycardic with severe abdominal distention and hyperactive bowel sounds concerning for likely small bowel obstruction. We'll treat with IV fluids, antiemetics, obtain CT of the abdomen and pelvis.  ----------------------------------------- 6:18 PM on 08/18/2014 -----------------------------------------   CT abdomen and pelvis concerning for small bowel instruction. NG tube ordered. Discussed with Dr. Rexene Edison of  surgery for evaluation. ____________________________________________   FINAL CLINICAL IMPRESSION(S) / ED DIAGNOSES  Final diagnoses:  Generalized abdominal pain  SBO (small bowel obstruction)      Joanne Gavel, MD 08/18/14 2344

## 2014-08-18 NOTE — H&P (Signed)
CC:  Nausea/vomiting, heartburn, abdominal distention  HPI:  81 yo M with history of NHL, DM, HTN, CAD and CHF who presents for 5 days of heartburn, poor appetite and 1 day of increasingly worse abdominal distention, nausea and vomiting.  Doing fine before this.  No obvious abdominal pain.  Has had multiple bm the last few days and is passing flatus.  History of diagnostic minilaparotomy for ln excision.  Has never had an SBO before. Otherwise no fevers/chills, night sweats, shortness of breath, cough, chest pain, dysuria/hematuria.  Active Ambulatory Problems    Diagnosis Date Noted  . No Active Ambulatory Problems   Resolved Ambulatory Problems    Diagnosis Date Noted  . No Resolved Ambulatory Problems   Past Medical History  Diagnosis Date  . Diabetes mellitus without complication   . Hypertension   . Hyperlipemia   . CHF (congestive heart failure)   . Cancer   CAD s/p CABG x 5   Medication List    ASK your doctor about these medications        aspirin 325 MG tablet  Take 1 tablet by mouth daily.     finasteride 5 MG tablet  Commonly known as:  PROSCAR  Take 1 tablet by mouth daily.     furosemide 20 MG tablet  Commonly known as:  LASIX  Take 1 tablet by mouth daily.     glimepiride 1 MG tablet  Commonly known as:  AMARYL  Take 1 tablet by mouth daily.     metFORMIN 500 MG tablet  Commonly known as:  GLUCOPHAGE  Take 500 mg by mouth daily with breakfast.     metoprolol succinate 50 MG 24 hr tablet  Commonly known as:  TOPROL-XL  Take 1 tablet by mouth daily.     ramipril 10 MG capsule  Commonly known as:  ALTACE  Take 1 capsule by mouth daily.     simvastatin 20 MG tablet  Commonly known as:  ZOCOR  Take 1 tablet by mouth daily.       History   Social History  . Marital Status: Married    Spouse Name: N/A  . Number of Children: N/A  . Years of Education: N/A   Occupational History  . Not on file.   Social History Main Topics  . Smoking status:  Never Smoker   . Smokeless tobacco: Not on file  . Alcohol Use: No  . Drug Use: No  . Sexual Activity: Not on file   Other Topics Concern  . Not on file   Social History Narrative  . No narrative on file   History reviewed. No pertinent family history.   No Known Allergies   ROS: Full ROS obtained, pertinent positives and negatives as above.  Blood pressure 148/81, pulse 95, temperature 97.9 F (36.6 C), temperature source Oral, resp. rate 22, height 5\' 10"  (1.778 m), weight 93.441 kg (206 lb), SpO2 100 %. GEN: NAD/A&Ox3 FACE: no obvious facial trauma, normal external nose, normal external ears EYES: no scleral icterus, no conjunctivitis HEAD: normocephalic atraumatic CV: RRR, no MRG RESP: moving air well, lungs clear ABD: soft, nontender, significantly distended and tympanetic EXT: moving all ext well, strength 5/5 NEURO: cnII-XII grossly intact, sensation intact all 4 ext  Labs: reviewed, significant for K 4.7 Cr 1.23 Glucose 233 WBC 9.5 with 80% neutrophils  CT: reviewed, dilated stomach and proximal sb, collapsed distal sb.  Consistent with SBO.  A/P 81 yo with multiple medical issues who presents  with SBO.  Admit, NG tube, IVF.  IM consult for assistance with other medical issues.  I have discussed this plan with Mr. Godeaux and his daughter and they agree with this plan.

## 2014-08-18 NOTE — ED Notes (Signed)
Pt to ed with c/o vomiting since Monday. Pt states he was up several times during the night last night vomiting.  Pt reports weakness all over and unable to hold fluids down.  Pt sent from [facility].

## 2014-08-19 ENCOUNTER — Inpatient Hospital Stay: Payer: Medicare Other

## 2014-08-19 LAB — BASIC METABOLIC PANEL
Anion gap: 7 (ref 5–15)
BUN: 14 mg/dL (ref 6–20)
CALCIUM: 8.8 mg/dL — AB (ref 8.9–10.3)
CHLORIDE: 102 mmol/L (ref 101–111)
CO2: 30 mmol/L (ref 22–32)
Creatinine, Ser: 1.02 mg/dL (ref 0.61–1.24)
GFR calc Af Amer: >60 mL/min (ref >60)
GFR calc non Af Amer: >60 mL/min (ref >60)
Glucose, Bld: 140 mg/dL — ABNORMAL HIGH (ref 65–99)
Potassium: 4.1 mmol/L (ref 3.5–5.1)
Sodium: 139 mmol/L (ref 135–145)

## 2014-08-19 LAB — CBC
HCT: 40.8 % (ref 40.0–52.0)
HEMOGLOBIN: 14.1 g/dL (ref 13.0–18.0)
MCH: 31.1 pg (ref 26.0–34.0)
MCHC: 34.5 g/dL (ref 32.0–36.0)
MCV: 90.2 fL (ref 80.0–100.0)
Platelets: 197 10*3/uL (ref 150–440)
RBC: 4.53 MIL/uL (ref 4.40–5.90)
RDW: 13.3 % (ref 11.5–14.5)
WBC: 8.2 10*3/uL (ref 3.8–10.6)

## 2014-08-19 LAB — GLUCOSE, CAPILLARY
GLUCOSE-CAPILLARY: 120 mg/dL — AB (ref 65–99)
GLUCOSE-CAPILLARY: 87 mg/dL (ref 65–99)
Glucose-Capillary: 116 mg/dL — ABNORMAL HIGH (ref 65–99)
Glucose-Capillary: 107 mg/dL — ABNORMAL HIGH (ref 65–99)

## 2014-08-19 LAB — PHOSPHORUS: PHOSPHORUS: 3.4 mg/dL (ref 2.5–4.6)

## 2014-08-19 LAB — MAGNESIUM: Magnesium: 2.2 mg/dL (ref 1.7–2.4)

## 2014-08-19 NOTE — Progress Notes (Signed)
   08/19/14 1015  Clinical Encounter Type  Visited With Patient and family together  Visit Type Initial  Referral From Nurse  Consult/Referral To Chaplain  Spiritual Encounters  Spiritual Needs Prayer  Stress Factors  Patient Stress Factors Health changes  Family Stress Factors Health changes  Met w/patient & daugther. Patient in good spirits and stated "feeling much better." Provided prayer and pastoral care.  [place]. Cyanne Delmar G. [place]

## 2014-08-19 NOTE — Consult Note (Signed)
Medical Consultation  Joseph Flowers TZG:[id number] DOB: 1927-04-19 DOA: 08/18/2014 PCP: Adrian Prows, MD   Requesting physician: DR. Kevan Rosebush Date of consultation: June 25,2016 Reason for consultation: Medical management  Impression/Recommendations This is a 81 year old male with a history of diabetes and hypertension who presented with small bowel obstruction.  1. Diabetes mellitus without complication: Since the patient is currently nothing by mouth I suggest we continue with sliding scale insulin. When he is able to take in his oral medications and is on a diet we may resume his outpatient medications. We will continue to monitor blood sugars.   2. Essential hypertension: Patient is on ramipril and metoprolol. Continue these medications. 3. 3. Hyperlipidemia: We may resume this medication when he is taking any diet.   4. BPH: Continue finasteride  5. Small bowel obstruction: Continue NG tube and NG mass per surgery.  Chief Complaint: Abdominal pain with nausea and vomiting  HPI:The severity pleasant 81 year old male with history of hypertension and diabetes who presented with abdominal pain, nausea, vomiting and regurgitation. He was found to have a small bowel obstruction. He currently has an NG tube placed. Hospital service was consulted for medical management.   Review of Systems  Constitutional: Negative for fever, chills weight loss HENT: Negative for ear pain, nosebleeds, congestion, facial swelling, rhinorrhea, neck pain, neck stiffness and ear discharge.   Respiratory: Negative for cough, shortness of breath, wheezing  Cardiovascular: Negative for chest pain, palpitations and leg swelling.  Gastrointestinal: he reports since the NG tube has been placed he has less abdominal distention and nausea  Genitourinary: Negative for dysuria, urgency, frequency, hematuria Musculoskeletal: Negative for back pain or joint pain Neurological: Negative for dizziness, seizures,  syncope, focal weakness,  numbness and headaches.  Hematological: Does not bruise/bleed easily.  Psychiatric/Behavioral: Negative for hallucinations, confusion, dysphoric mood   Past Medical History  Diagnosis Date  . Diabetes mellitus without complication   . Hypertension   . Hyperlipemia   . CHF (congestive heart failure)   . Cancer      lymphoma   surgical history:   lymphoma removed in his abdomen  Family history: Positive history of coronary artery disease  Social History:  reports that he has never smoked. He does not have any smokeless tobacco history on file. He reports that he does not drink alcohol or use illicit drugs.  No Known Allergies History reviewed. No pertinent family history.  Prior to Admission medications   Medication Sig Start Date End Date Taking? Authorizing Provider  aspirin 325 MG tablet Take 1 tablet by mouth daily.   Yes Historical Provider, MD  finasteride (PROSCAR) 5 MG tablet Take 1 tablet by mouth daily. 06/12/14  Yes Historical Provider, MD  furosemide (LASIX) 20 MG tablet Take 1 tablet by mouth daily. 08/11/14  Yes Historical Provider, MD  glimepiride (AMARYL) 1 MG tablet Take 1 tablet by mouth daily. 06/12/14  Yes Historical Provider, MD  metFORMIN (GLUCOPHAGE) 500 MG tablet Take 500 mg by mouth daily with breakfast.  08/11/14  Yes Historical Provider, MD  metoprolol succinate (TOPROL-XL) 50 MG 24 hr tablet Take 1 tablet by mouth daily. 08/11/14  Yes Historical Provider, MD  ramipril (ALTACE) 10 MG capsule Take 1 capsule by mouth daily. 08/11/14  Yes Historical Provider, MD  simvastatin (ZOCOR) 20 MG tablet Take 1 tablet by mouth daily. 08/11/14  Yes Historical Provider, MD    Physical Exam: Blood pressure 123/58, pulse 80, temperature 98 F (36.7 C), temperature source Oral, resp. rate 18, height  5\' 10"  (1.778 m), weight 93.441 kg (206 lb), SpO2 92 %. @VITALS2 @ [employer]   08/18/14 1311  Weight: 93.441 kg (206 lb)    Intake/Output Summary  (Last 24 hours) at 08/19/14 1205 Last data filed at 08/19/14 1058  Gross per 24 hour  Intake 2399.1 ml  Output   1000 ml  Net 1399.1 ml     Constitutional: Appears well-developed and well-Has NG tube placed HENT: Normocephalic atraumatic no scleral icterus.  Neck: Normal ROM. Neck supple. No JVD. No tracheal deviation. CVS: RRR, S1/S2 +, no murmurs, no gallops, no carotid bruit.  Pulmonary: Effort and breath sounds normal, no stridor, rhonchi, wheezes, rales.  Abdominal: Hypoactive bowel sounds with mild distention. No  tenderness, rebound or guarding.  Musculoskeletal: Normal range of motion. No edema and no tenderness.  Neuro: Alert. CN 2-12 grossly intact. No focal deficits. Skin: Skin is warm and dry. No rash noted. Psychiatric: Normal mood and affect.    Labs  Basic Metabolic Panel:  Recent Labs Lab 08/19/14 0409  NA 139  K 4.1  CL 102  CO2 30  GLUCOSE 140*  BUN 14  CREATININE 1.02  CALCIUM 8.8*  MG 2.2  PHOS 3.4   Liver Function Tests:  Recent Labs Lab 08/18/14 1318  AST 48*  ALT 43  ALKPHOS 64  BILITOT 1.2  PROT 7.5  ALBUMIN 4.1    Recent Labs Lab 08/18/14 1314  LIPASE 30    CBC:  Recent Labs Lab 08/18/14 1318 08/19/14 0409  WBC 9.5 8.2  NEUTROABS 7.6*  --   HGB 15.1 14.1  HCT 44.2 40.8  MCV 90.0 90.2  PLT 239 197   Cardiac Enzymes:  Recent Labs Lab 08/18/14 1314  TROPONINI <0.03   BNP: Invalid input(s): POCBNP CBG:  Recent Labs Lab 08/19/14 0702  GLUCAP 120*    Radiological Exams: Ct Abdomen Pelvis W Contrast    IMPRESSION: 1. Findings compatible with small bowel obstruction. Transition point is in the right abdomen proximal to the terminal ileum. Finding may be secondary to adhesions. 2. Again noted is abnormal soft tissue within the small bowel mesenteric compatible with treated lymphoma. 3. Aortic atherosclerosis 4. Hepatic steatosis. 5. Gallstones.   Electronically Signed   By: Kerby Moors M.D.   On: 08/18/2014  17:46   Dg Abd 2 Views  08/19/2014   C.  IMPRESSION: Slight improvement in small bowel dilatation.   Electronically Signed   By: Inez Catalina M.D.   On: 08/19/2014 08:28       Thank you for allowing me to participate in the care of your patient. We will continue to follow.  greater than 50% time spent in care and coordination   Time spent: 45  Eliab Closson, MD

## 2014-08-19 NOTE — Progress Notes (Signed)
Surgery Progress Note  S: Feels better, no pain, nausea improved, + flatus O: AF/VSS, good uop GEN: NAD/A&Ox3 ABD: soft, improved distention, nontender  Labs: reviewed, Cr 1.02  A/P 81 yo admitted for SBO, clinically improved - cont NG tube, watch output

## 2014-08-20 ENCOUNTER — Inpatient Hospital Stay: Payer: Medicare Other

## 2014-08-20 LAB — GLUCOSE, CAPILLARY
GLUCOSE-CAPILLARY: 98 mg/dL (ref 65–99)
GLUCOSE-CAPILLARY: 79 mg/dL (ref 65–99)
Glucose-Capillary: 85 mg/dL (ref 65–99)
Glucose-Capillary: 89 mg/dL (ref 65–99)

## 2014-08-20 NOTE — Progress Notes (Signed)
[place] at [place] NAME: Joseph Flowers    MR#:  [id number]  DATE OF BIRTH:  1928/02/17  SUBJECTIVE:  Still with some abdominal discomfort bloating  REVIEW OF SYSTEMS:    Review of Systems  Constitutional: Negative for fever, chills and malaise/fatigue.  HENT: Negative for sore throat.   Eyes: Negative for blurred vision.  Respiratory: Negative for cough, hemoptysis, shortness of breath and wheezing.   Cardiovascular: Negative for chest pain, palpitations and leg swelling.  Gastrointestinal: Positive for abdominal pain. Negative for nausea, vomiting, diarrhea and blood in stool.       Improved  Genitourinary: Negative for dysuria.  Musculoskeletal: Negative for back pain.  Neurological: Negative for dizziness, tremors and headaches.  Endo/Heme/Allergies: Does not bruise/bleed easily.    Tolerating Diet:NPO      DRUG ALLERGIES:  No Known Allergies  VITALS:  Blood pressure 133/61, pulse 84, temperature 97.9 F (36.6 C), temperature source Oral, resp. rate 16, height 5\' 10"  (1.778 m), weight 93.441 kg (206 lb), SpO2 98 %.  PHYSICAL EXAMINATION:   Physical Exam  Constitutional: He is oriented to person, place, and time and well-developed, well-nourished, and in no distress. No distress.  HENT:  Head: Normocephalic.  NGT placed  Eyes: No scleral icterus.  Neck: Normal range of motion. Neck supple. No JVD present. No tracheal deviation present.  Cardiovascular: Normal rate, regular rhythm and normal heart sounds.  Exam reveals no gallop and no friction rub.   No murmur heard. Pulmonary/Chest: Effort normal and breath sounds normal. No respiratory distress. He has no wheezes. He has no rales. He exhibits no tenderness.  Abdominal: He exhibits distension. He exhibits no mass. There is tenderness. There is no rebound and no guarding.  Musculoskeletal: Normal range of motion. He exhibits no edema.  Neurological: He is alert  and oriented to person, place, and time.  Skin: Skin is warm. No rash noted. No erythema.  Psychiatric: Affect and judgment normal.      LABORATORY PANEL:   CBC  Recent Labs Lab 08/19/14 0409  WBC 8.2  HGB 14.1  HCT 40.8  PLT 197   ------------------------------------------------------------------------------------------------------------------  Chemistries   Recent Labs Lab 08/18/14 1318 08/19/14 0409  NA 135 139  K 4.7 4.1  CL 97* 102  CO2 27 30  GLUCOSE 233* 140*  BUN 14 14  CREATININE 1.24 1.02  CALCIUM 9.5 8.8*  MG  --  2.2  AST 48*  --   ALT 43  --   ALKPHOS 64  --   BILITOT 1.2  --    ------------------------------------------------------------------------------------------------------------------  Cardiac Enzymes  Recent Labs Lab 08/18/14 1314  TROPONINI <0.03   ------------------------------------------------------------------------------------------------------------------  RADIOLOGY:  Ct Abdomen Pelvis W Contrast  08/18/2014   CLINICAL DATA:  Vomiting since Monday.  EXAM: CT ABDOMEN AND PELVIS WITH CONTRAST  TECHNIQUE: Multidetector CT imaging of the abdomen and pelvis was performed using the standard protocol following bolus administration of intravenous contrast.  CONTRAST:  15mL OMNIPAQUE IOHEXOL 300 MG/ML  SOLN  COMPARISON:  10/25/2010 and 08/21/2010  FINDINGS: Lower chest: The lung bases appear clear. No pleural or pericardial effusion.  Hepatobiliary: Mild diffuse hepatic steatosis. The small stones noted within the dependent portion of the gallbladder. No biliary dilatation.  Pancreas: Negative  Spleen: Negative  Adrenals/Urinary Tract: Normal appearance of the adrenal glands. The kidneys are both on unremarkable. Urinary bladder is normal.  Stomach/Bowel: There is fluid distension of the stomach. Small bowel dilatation and  multiple air-fluid levels are identified. Transition to decreased caliber small bowel loops stress set transition to  decreased caliber distal small bowel is identified within the right abdomen approximate image 37 of series 6 and image 52 of series 6. The terminal ileum is normal in caliber. The colon is on unremarkable.  Vascular/Lymphatic: Calcified atherosclerotic disease involves the abdominal aorta. No aneurysm. No enlarged retroperitoneal or pelvic lymph nodes identified. Central small bowel mesenteric soft tissue surrounds the superior mesenteric vein and artery spot. On today's study this measured 5.2 x 3.1cm. This is compatible with treated tumor. Previously this measure 6.2 x 3.7 cm.  Reproductive: Prostate gland and seminal vesicles are on unremarkable.  Other: No free fluid or fluid collections identified within the abdomen or pelvis.  Musculoskeletal: Degenerative disc disease noted within the lower thoracic spine.  IMPRESSION: 1. Findings compatible with small bowel obstruction. Transition point is in the right abdomen proximal to the terminal ileum. Finding may be secondary to adhesions. 2. Again noted is abnormal soft tissue within the small bowel mesenteric compatible with treated lymphoma. 3. Aortic atherosclerosis 4. Hepatic steatosis. 5. Gallstones.   Electronically Signed   By: Kerby Moors M.D.   On: 08/18/2014 17:46   Dg Abd 2 Views   IMPRESSION: Persistent high-grade small bowel obstruction.  No evidence for free air.   Electronically Signed   By: Nolon Nations M.D.   On: 08/20/2014 09:08     ASSESSMENT AND PLAN:   This is a 81 year old male with a history of diabetes and hypertension who presented with small bowel obstruction.  1. Diabetes mellitus without complication: Since the patient is currently nothing by mouth, continue with sliding scale insulin.  When he is able to take in his oral medications and is on a diet we may resume his outpatient medications. Continue to monitor blood sugars.  2. Essential hypertension: Blood pressure acceptable. Patient is on ramipril and metoprolol.  Continue these medications.   3. Hyperlipidemia: We may resume this medication when he is taking any diet.   4. BPH: Continue finasteride  5. Small bowel obstruction: Continue NG tube and management per surgery.      Management plans discussed with the patient and he is in agreement.  CODE STATUS: FULL  TOTAL TIME TAKING CARE OF THIS PATIENT: 23 minutes.   Greater than 50% counseling and coordination of care  POSSIBLE D/C , DEPENDING ON CLINICAL CONDITION.   Bandon Sherwin M.D on 08/20/2014 at 10:27 AM  Between 7am to 6pm - Pager - [phone] After 6pm go to [email or web address] - password [email or web address] [facility]  [facility]  Office  [phone]  CC: Primary care physician; Adrian Prows, MD

## 2014-08-20 NOTE — Progress Notes (Signed)
Surgery Progress Note  S: No acute issues.  + flatus.  850 ng tube O: AF/VSS, good uop GEN: NAD/A&Ox3 ABD: soft, min tender, moderate distention  KUB: KUB with similar dilated loops of small bowel pattern  A/P 81 yo admit with SBO.  Little progression.  Have discussed with him and have told him he may need laparotomy if does not improve over next few days. Have even offered surgery today but he would like to wait a few more days.

## 2014-08-20 NOTE — Progress Notes (Signed)
While on toilet

## 2014-08-21 ENCOUNTER — Inpatient Hospital Stay: Payer: Medicare Other

## 2014-08-21 DIAGNOSIS — K56609 Unspecified intestinal obstruction, unspecified as to partial versus complete obstruction: Secondary | ICD-10-CM | POA: Insufficient documentation

## 2014-08-21 DIAGNOSIS — K5669 Other intestinal obstruction: Secondary | ICD-10-CM

## 2014-08-21 LAB — BASIC METABOLIC PANEL
Anion gap: 11 (ref 5–15)
BUN: 15 mg/dL (ref 6–20)
CALCIUM: 8.1 mg/dL — AB (ref 8.9–10.3)
CO2: 19 mmol/L — ABNORMAL LOW (ref 22–32)
CREATININE: 0.89 mg/dL (ref 0.61–1.24)
Chloride: 110 mmol/L (ref 101–111)
GFR calc non Af Amer: >60 mL/min (ref >60)
Glucose, Bld: 113 mg/dL — ABNORMAL HIGH (ref 65–99)
Potassium: 3.9 mmol/L (ref 3.5–5.1)
Sodium: 140 mmol/L (ref 135–145)

## 2014-08-21 LAB — CBC
HCT: 37.8 % — ABNORMAL LOW (ref 40.0–52.0)
HEMOGLOBIN: 12.8 g/dL — AB (ref 13.0–18.0)
MCH: 31.1 pg (ref 26.0–34.0)
MCHC: 34 g/dL (ref 32.0–36.0)
MCV: 91.5 fL (ref 80.0–100.0)
Platelets: 203 10*3/uL (ref 150–440)
RBC: 4.13 MIL/uL — ABNORMAL LOW (ref 4.40–5.90)
RDW: 13.3 % (ref 11.5–14.5)
WBC: 10 10*3/uL (ref 3.8–10.6)

## 2014-08-21 LAB — GLUCOSE, CAPILLARY
GLUCOSE-CAPILLARY: 110 mg/dL — AB (ref 65–99)
Glucose-Capillary: 106 mg/dL — ABNORMAL HIGH (ref 65–99)
Glucose-Capillary: 111 mg/dL — ABNORMAL HIGH (ref 65–99)
Glucose-Capillary: 129 mg/dL — ABNORMAL HIGH (ref 65–99)

## 2014-08-21 NOTE — Progress Notes (Signed)
[place] at [place] NAME: Joseph Flowers    MR#:  [id number]  DATE OF BIRTH:  Jul 29, 1927  SUBJECTIVE:  Complaining of abdominal pain and regurgitation No acute events overnight  REVIEW OF SYSTEMS:    Review of Systems  Constitutional: Negative for fever, chills and malaise/fatigue.  HENT: Negative for sore throat.   Eyes: Negative for blurred vision.  Respiratory: Positive for sputum production. Negative for cough, hemoptysis, shortness of breath and wheezing.   Cardiovascular: Negative for chest pain, palpitations and leg swelling.  Gastrointestinal: Positive for nausea and abdominal pain. Negative for vomiting, diarrhea and blood in stool.       Improved  Genitourinary: Negative for dysuria.  Musculoskeletal: Negative for back pain.  Neurological: Negative for dizziness, tremors and headaches.  Endo/Heme/Allergies: Does not bruise/bleed easily.    Tolerating Diet: Ice chips     DRUG ALLERGIES:  No Known Allergies  VITALS:  Blood pressure 138/59, pulse 91, temperature 98 F (36.7 C), temperature source Oral, resp. rate 18, height 5\' 10"  (1.778 m), weight 93.441 kg (206 lb), SpO2 96 %.  PHYSICAL EXAMINATION:   Physical Exam  Constitutional: He is oriented to person, place, and time and well-developed, well-nourished, and in no distress. No distress.  HENT:  Head: Normocephalic.  NGT placed  Eyes: No scleral icterus.  Neck: Normal range of motion. Neck supple. No JVD present. No tracheal deviation present.  Cardiovascular: Normal rate, regular rhythm and normal heart sounds.  Exam reveals no gallop and no friction rub.   No murmur heard. Pulmonary/Chest: Effort normal and breath sounds normal. No respiratory distress. He has no wheezes. He has no rales. He exhibits no tenderness.  Abdominal: He exhibits distension. He exhibits no mass. There is tenderness. There is no rebound and no guarding.  Musculoskeletal: Normal  range of motion. He exhibits no edema.  Neurological: He is alert and oriented to person, place, and time.  Skin: Skin is warm. No rash noted. No erythema.  Psychiatric: Affect and judgment normal.      LABORATORY PANEL:   CBC  Recent Labs Lab 08/21/14 0432  WBC 10.0  HGB 12.8*  HCT 37.8*  PLT 203   ------------------------------------------------------------------------------------------------------------------  Chemistries   Recent Labs Lab 08/18/14 1318 08/19/14 0409 08/21/14 0432  NA 135 139 140  K 4.7 4.1 3.9  CL 97* 102 110  CO2 27 30 19*  GLUCOSE 233* 140* 113*  BUN 14 14 15   CREATININE 1.24 1.02 0.89  CALCIUM 9.5 8.8* 8.1*  MG  --  2.2  --   AST 48*  --   --   ALT 43  --   --   ALKPHOS 64  --   --   BILITOT 1.2  --   --    ------------------------------------------------------------------------------------------------------------------  Cardiac Enzymes  Recent Labs Lab 08/18/14 1314  TROPONINI <0.03   ------------------------------------------------------------------------------------------------------------------  RADIOLOGY:  Ct Abdomen Pelvis W Contrast  08/18/2014     IMPRESSION: 1. Findings compatible with small bowel obstruction. Transition point is in the right abdomen proximal to the terminal ileum. Finding may be secondary to adhesions. 2. Again noted is abnormal soft tissue within the small bowel mesenteric compatible with treated lymphoma. 3. Aortic atherosclerosis 4. Hepatic steatosis. 5. Gallstones.   Electronically Signed   By: Kerby Moors M.D.   On: 08/18/2014 17:46   Dg Abd 2 Views   IMPRESSION: Persistent high-grade small bowel obstruction.  No evidence for free air.  Electronically Signed   By: Nolon Nations M.D.   On: 08/20/2014 09:08     ASSESSMENT AND PLAN:   This is a 81 year old male with a history of diabetes and hypertension who presented with small bowel obstruction.  1. Diabetes mellitus without complication:  Blood sugars are running between 80-106. Since the patient is currently nothing by mouth, continue with sliding scale insulin.  When he is able to take in his oral medications and is on a diet we may resume his outpatient medications. Continue to monitor blood sugars.  2. Essential hypertension: Blood pressure is acceptable. Patient is on ramipril and metoprolol. Continue these medications.   3. Hyperlipidemia: We may resume this medication when he is taking any diet.   4. BPH: Continue finasteride  5. Small bowel obstruction: Continue NG tube and management per surgery. I will order KUB and chest x-ray for this morning.       Management plans discussed with the patient and he is in agreement.  CODE STATUS: FULL  TOTAL TIME TAKING CARE OF THIS PATIENT: 25 minutes.   Greater than 50% counseling and coordination of care  POSSIBLE D/C , DEPENDING ON CLINICAL CONDITION.   Joseph Flowers M.D on 08/21/2014 at 10:39 AM  Between 7am to 6pm - Pager - [phone] After 6pm go to [email or web address] - password [email or web address] [facility]  [facility]  Office  [phone]  CC: Primary care physician; Adrian Prows, MD

## 2014-08-21 NOTE — Progress Notes (Signed)
Per Dr. Benjie Karvonen order for q 6hr finger sticks as well as at mealtimes

## 2014-08-21 NOTE — Care Management Note (Signed)
Case Management Note  Patient Details  Name: Joseph Flowers MRN: [id number] Date of Birth: 1927-04-11  Subjective/Objective:                    Action/Plan:   Expected Discharge Date:                 Expected Discharge Plan:     In-House Referral:     Discharge planning Services     Post Acute Care Choice:    Choice offered to:     DME Arranged:    DME Agency:     HH Arranged:    [place] Agency:     Status of Service:     Medicare Important Message Given:   Yes Date Medicare IM Given:   08/21/14 Medicare IM give by:   L.Daniyla Pfahler, RN Date Additional Medicare IM Given:    Additional Medicare Important Message give by:     If discussed at [place] of Stay Meetings, dates discussed:    Additional Comments:  Joseph Flowers A, RN 08/21/2014, 9:53 AM

## 2014-08-21 NOTE — Progress Notes (Signed)
[facility]   PATIENT NAME: Joseph Flowers    MR#:  [id number]  DATE OF BIRTH:  05/08/1927  SUBJECTIVE:   He continues to have less abdominal pain and distention he is passing flatus. No nausea and vomiting. Nasogastric output has decreased. Last shift only 75 cc. REVIEW OF SYSTEMS:   Review of Systems  Constitutional: Negative for fever and chills.  Respiratory: Negative.   Cardiovascular: Negative.   Gastrointestinal: Negative for nausea and vomiting.  Skin: Negative.   All other systems reviewed and are negative.   DRUG ALLERGIES:  No Known Allergies  VITALS:   Filed Vitals:   08/20/14 1618 08/20/14 2334 08/21/14 0752 08/21/14 1118  BP: 136/57 137/53 138/59 142/61  Pulse: 95 91 91 91  Temp:  97.8 F (36.6 C) 98 F (36.7 C)   TempSrc:  Oral Oral   Resp: 18 16 18    Height:      Weight:      SpO2: 100% 98% 96%     PHYSICAL EXAMINATION:  GENERAL:  81 y.o.-year-old patient lying in the bed with no acute distress.  EYES: Pupils equal, round, reactive to light and accommodation. No scleral icterus. Extraocular muscles intact.  HEENT: Head atraumatic, normocephalic. Oropharynx and nasopharynx clear.  NECK:  Supple, no jugular venous distention. No thyroid enlargement, no tenderness.  LUNGS: Normal breath sounds bilaterally, no wheezing, rales,rhonchi or crepitation. No use of accessory muscles of respiration.  CARDIOVASCULAR: S1, S2 normal. No murmurs, rubs, or gallops.  ABDOMEN: Soft, nontender, nondistended. Bowel sounds present. No organomegaly or mass. There is no peritoneal signs. EXTREMITIES: No pedal edema, cyanosis, or clubbing.  NEUROLOGIC: Cranial nerves II through XII are intact. Muscle strength 5/5 in all extremities. Sensation intact. Gait not checked.  PSYCHIATRIC: The patient is alert and oriented x 3.  SKIN: No obvious rash, lesion, or ulcer.  NG tube is slightly bilious.   ASSESSMENT AND PLAN:   81 year old with history of lymphoma and  laparotomy for such. Looks to be resolving his small bowel obstruction with nasogastric tube decompression. We will obtain to a of the abdomen today and proceed accordingly.  I will personally review the PACs images. Hopefully I do not think she will need an operation. Review of prior films however demonstrated a high-grade obstruction.

## 2014-08-21 NOTE — Care Management (Signed)
Progressing slow. No decision on surgical intervention. NG tube present, Xray today. A and O from home. Continue to follow.

## 2014-08-22 ENCOUNTER — Inpatient Hospital Stay: Payer: Medicare Other

## 2014-08-22 DIAGNOSIS — K565 Intestinal adhesions [bands] with obstruction (postprocedural) (postinfection): Principal | ICD-10-CM

## 2014-08-22 DIAGNOSIS — K56609 Unspecified intestinal obstruction, unspecified as to partial versus complete obstruction: Secondary | ICD-10-CM | POA: Insufficient documentation

## 2014-08-22 LAB — GLUCOSE, CAPILLARY
Glucose-Capillary: 121 mg/dL — ABNORMAL HIGH (ref 65–99)
Glucose-Capillary: 165 mg/dL — ABNORMAL HIGH (ref 65–99)
Glucose-Capillary: 162 mg/dL — ABNORMAL HIGH (ref 65–99)

## 2014-08-22 NOTE — Progress Notes (Signed)
[facility]   PATIENT NAME: Joseph Flowers    MR#:  [id number]  DATE OF BIRTH:  January 09, 1928  SUBJECTIVE:   Feels better, passing gas and had BM.  No nausea or emesis since NGT placed to gravity drainage. Hungry, Wants NGT removed. REVIEW OF SYSTEMS:   Review of Systems  Constitutional: Negative for fever and chills.  HENT: Negative.   Cardiovascular: Negative.   Gastrointestinal: Negative for nausea, vomiting, abdominal pain and constipation.  All other systems reviewed and are negative.   DRUG ALLERGIES:  No Known Allergies  VITALS:  Blood pressure 140/69, pulse 90, temperature 98.5 F (36.9 C), temperature source Oral, resp. rate 20, height 5\' 10"  (1.778 m), weight 93.441 kg (206 lb), SpO2 96 %.  PHYSICAL EXAMINATION:  GENERAL:  81 y.o.-year-old patient lying in the bed with no acute distress.  EYES: Pupils equal, round, reactive to light and accommodation. No scleral icterus. Extraocular muscles intact.  HEENT: Head atraumatic, normocephalic. Oropharynx and nasopharynx clear.  NECK:  Supple, no jugular venous distention. No thyroid enlargement, no tenderness.  LUNGS: Normal breath sounds bilaterally, no wheezing, rales,rhonchi or crepitation. No use of accessory muscles of respiration.  CARDIOVASCULAR: S1, S2 normal. No murmurs, rubs, or gallops.  ABDOMEN: Soft, nontender, nondistended. Bowel sounds present. No organomegaly or mass.  No tenderness noted.  EXTREMITIES: No pedal edema, cyanosis, or clubbing.  NEUROLOGIC: Cranial nerves II through XII are intact. Muscle strength 5/5 in all extremities. Sensation intact. Gait not checked.  PSYCHIATRIC: The patient is alert and oriented x 3.  SKIN: No obvious rash, lesion, or ulcer.     ASSESSMENT AND PLAN:   81 y/o male with SBO which looks to be resolving.   Plan dc ngt, start clears, if does not tolerate will likely need LOA.  X-rays personally reviewed on PACS monitor.

## 2014-08-22 NOTE — Progress Notes (Signed)
Surgery  Continues to pass gas and had BM today Tolerating liquids. abd soft, slightly tympanitic. QIH:[id number] improvement Plan:  Serial exam If still tympanitic plan f/u films in am. Stays on clears. Attempt to contact family unsuccessful.

## 2014-08-22 NOTE — Progress Notes (Addendum)
[place] at [place] NAME: Joseph Flowers    MR#:  [id number]  DATE OF BIRTH:  08-23-1927  SUBJECTIVE:  Patient's NG tube has been discontinued. Patient is starting a clear liquid diet. Patient reports no abdominal pain or nausea.  REVIEW OF SYSTEMS:    Review of Systems  Constitutional: Negative for fever, chills and malaise/fatigue.  HENT: Negative for sore throat.   Eyes: Negative for blurred vision.  Respiratory: Negative for cough, hemoptysis, shortness of breath and wheezing.   Cardiovascular: Negative for chest pain, palpitations and leg swelling.  Gastrointestinal: Negative for nausea, vomiting, abdominal pain, diarrhea and blood in stool.       Improved  Genitourinary: Negative for dysuria.  Musculoskeletal: Negative for back pain.  Neurological: Negative for dizziness, tremors and headaches.  Endo/Heme/Allergies: Does not bruise/bleed easily.    Tolerating Diet: Ice chips     DRUG ALLERGIES:  No Known Allergies  VITALS:  Blood pressure 140/69, pulse 90, temperature 98.5 F (36.9 C), temperature source Oral, resp. rate 20, height 5\' 10"  (1.778 m), weight 93.441 kg (206 lb), SpO2 96 %.  PHYSICAL EXAMINATION:   Physical Exam  Constitutional: He is oriented to person, place, and time and well-developed, well-nourished, and in no distress. No distress.  HENT:  Head: Normocephalic.  Eyes: No scleral icterus.  Neck: Normal range of motion. Neck supple. No JVD present. No tracheal deviation present.  Cardiovascular: Normal rate, regular rhythm and normal heart sounds.  Exam reveals no gallop and no friction rub.   No murmur heard. Pulmonary/Chest: Effort normal and breath sounds normal. No respiratory distress. He has no wheezes. He has no rales. He exhibits no tenderness.  Abdominal: He exhibits distension. He exhibits no mass. There is no tenderness. There is no rebound and no guarding.  Hypoactive BS   Musculoskeletal: Normal range of motion. He exhibits no edema.  Neurological: He is alert and oriented to person, place, and time.  Skin: Skin is warm. No rash noted. No erythema.  Psychiatric: Affect and judgment normal.      LABORATORY PANEL:   CBC  Recent Labs Lab 08/21/14 0432  WBC 10.0  HGB 12.8*  HCT 37.8*  PLT 203   ------------------------------------------------------------------------------------------------------------------  Chemistries   Recent Labs Lab 08/18/14 1318 08/19/14 0409 08/21/14 0432  NA 135 139 140  K 4.7 4.1 3.9  CL 97* 102 110  CO2 27 30 19*  GLUCOSE 233* 140* 113*  BUN 14 14 15   CREATININE 1.24 1.02 0.89  CALCIUM 9.5 8.8* 8.1*  MG  --  2.2  --   AST 48*  --   --   ALT 43  --   --   ALKPHOS 64  --   --   BILITOT 1.2  --   --    ------------------------------------------------------------------------------------------------------------------  Cardiac Enzymes  Recent Labs Lab 08/18/14 1314  TROPONINI <0.03   ------------------------------------------------------------------------------------------------------------------  RADIOLOGY:  Ct Abdomen Pelvis W Contrast  08/18/2014     IMPRESSION: 1. Findings compatible with small bowel obstruction. Transition point is in the right abdomen proximal to the terminal ileum. Finding may be secondary to adhesions. 2. Again noted is abnormal soft tissue within the small bowel mesenteric compatible with treated lymphoma. 3. Aortic atherosclerosis 4. Hepatic steatosis. 5. Gallstones.   Electronically Signed   By: Kerby Moors M.D.   On: 08/18/2014 17:46   Dg Abd 2 Views   IMPRESSION: Persistent high-grade small bowel obstruction.  No evidence  for free air.   Electronically Signed   By: Nolon Nations M.D.   On: 08/20/2014 09:08     ASSESSMENT AND PLAN:   This is a 81 year old male with a history of diabetes and hypertension who presented with small bowel obstruction.  1. Diabetes  mellitus without complication: Blood sugars are running between 80-125. At this time I would continue sliding scale. If he is tolerating his diet and will not surgery, we will resume his outpatient medications. 2. Essential hypertension: Blood pressure is acceptable. Patient is on ramipril and metoprolol. Continue these medications.   3. Hyperlipidemia: We may resume this medication if he tolerates his diet.  4. BPH: Continue finasteride  5. Small bowel obstruction: As per surgery NG tube as been discontinued. If he tolerates his diet then no intervention needed. However, if he does not tolerate his diet he may need to undergo surgery.       Management plans discussed with the patient and he is in agreement.  CODE STATUS: FULL  TOTAL TIME TAKING CARE OF THIS PATIENT: 22 minutes.   Greater than 50% counseling and coordination of care  POSSIBLE D/C 2-3 days , DEPENDING ON CLINICAL CONDITION.   Crystalynn Mcinerney M.D on 08/22/2014 at 9:58 AM  Between 7am to 6pm - Pager - [phone] After 6pm go to [email or web address] - password [email or web address] [facility]  [place] [facility]  Office  [phone]  CC: Primary care physician; Adrian Prows, MD

## 2014-08-23 LAB — BASIC METABOLIC PANEL
Anion gap: 6 (ref 5–15)
BUN: 14 mg/dL (ref 6–20)
CO2: 23 mmol/L (ref 22–32)
Calcium: 8 mg/dL — ABNORMAL LOW (ref 8.9–10.3)
Chloride: 111 mmol/L (ref 101–111)
Creatinine, Ser: 0.86 mg/dL (ref 0.61–1.24)
GFR calc non Af Amer: >60 mL/min (ref >60)
GLUCOSE: 142 mg/dL — AB (ref 65–99)
POTASSIUM: 3.5 mmol/L (ref 3.5–5.1)
Sodium: 140 mmol/L (ref 135–145)

## 2014-08-23 LAB — GLUCOSE, CAPILLARY
GLUCOSE-CAPILLARY: 113 mg/dL — AB (ref 65–99)
GLUCOSE-CAPILLARY: 162 mg/dL — AB (ref 65–99)
Glucose-Capillary: 133 mg/dL — ABNORMAL HIGH (ref 65–99)
Glucose-Capillary: 155 mg/dL — ABNORMAL HIGH (ref 65–99)

## 2014-08-23 MED ORDER — PANTOPRAZOLE SODIUM 40 MG PO TBEC: 40.0000 mg | DELAYED_RELEASE_TABLET | Freq: Every day | ORAL | Status: DC

## 2014-08-23 NOTE — Progress Notes (Signed)
Pt d/c home; d/c instructions reviewed w/ pt; pt understanding was verbalized; IV removed catheter in tact, gauze dressing applied; all pt questions answered; pt left unit via wheelchair accompanied by staff 

## 2014-08-23 NOTE — Plan of Care (Signed)
Problem: Phase I Progression Outcomes Goal: Tubes/drains patent Patient denied pain N&V and was A&O X4 with active bowel sound. VS remained WDL for patient and does not appear to be in any distress. He remained hemodynamically stable and rested for most of the night.

## 2014-08-23 NOTE — Discharge Instructions (Signed)
Adhesions Adhesions are stringy (fibrous) bands of tissue. Adhesions are similar to scars, but they are on the inside of your body. Adhesions form between two surfaces of the body. CAUSES  1. The most common adhesions are those that occur following surgery. Touching and moving things within the belly (abdomen) leads to inflammation and adhesions can form. Not all people get adhesions following surgery. But, adhesions may happen even with the gentlest handling of the organs in the abdomen. There is no way to predict who will have adhesions. The adhesions can occur between: 1. Loops of bowel. 2. The bowel and other organs such as the liver. 3. The contents in the pelvis such as the uterus, bladder, ovaries and tubes. 2. Inflammation within the abdomen even if there is no surgery. An example would be an infection in the tubes and uterus (pelvic inflammatory disease). Any infection will cause inflammation that may lead to adhesions. 3. Radiation treatment. SYMPTOMS  Many people have no signs or symptoms from adhesions. However, adhesions may cause a number of symptoms. Some of these are: 1. Abdominal pain, tenderness or cramping. 2. Abdominal bloating. 3. Constipation or diarrhea. 4. Vomiting. 5. Painful sex. 6. In females, difficulty getting pregnant. DIAGNOSIS   An exam by your caregiver may suggest that adhesions are present.  A surgical procedure using a small incision and a thin scope can be used to look inside the abdomen at the adhesions.  Sometimes x-rays may suggest adhesions inside the abdomen. TREATMENT  Surgery can be performed to separate the adhesions. This often takes care of the problems caused by the adhesions, although surgery may also cause more adhesions to develop. PROGNOSIS   The outcome is usually good if an operation is used to fix adhesions inside the belly.  All adhesions may reoccur and the problem can come back. HOME CARE INSTRUCTIONS   Take usual medications  as directed by your caregiver.  Only take over-the-counter or prescription medicines for pain, discomfort or fever as directed by your caregiver.  Pay attention to the pain:  Has it changed?  Has it moved?  Is it gone?  Do not eat solid food until your pain is gone.  While you have pain: Stay on a clear liquid diet. A clear liquid is one you can see through (water, weak tea, broth or bouillon, lemon lime carbonated drinks, gelatin, popsicles or ice chips).  When your pain is gone: Start a light diet (dry toast, crackers, applesauce, white rice, bananas, broth or bouillon). Increase the diet slowly as long as it does not bother you. No dairy products (including cheese and eggs) and no spicy, fatty, fried or high fiber foods. Your caregiver will tell you if you should be on a special diet.  No alcohol, caffeine or cigarettes.  If your caregiver has given you a follow-up appointment, it is very important to keep that appointment. Not keeping the appointment could result in a permanent injury or lasting (chronic) pain or disability. SEEK IMMEDIATE MEDICAL CARE IF:   Your pain is not gone in 24 hours.  Your pain becomes worse, changes location or feels different.  You have a fever.  Your vomiting will not stop.  You have blood or brown flecks (like coffee grounds) in your vomit.  You have blood in your bowel movements.  Your bowel movements are dark or black.  Your bowel movements stop (become blocked) or you can not pass gas. Document Released: 05/03/2003 Document Revised: 05/05/2011 Document Reviewed: 12/01/2008 ExitCare Patient Information  2015 [employer]. This information is not intended to replace advice given to you by your health care provider. Make sure you discuss any questions you have with your health care provider.   DISCHARGE INSTRUCTIONS TO PATIENT  REMINDER:   Carry a list of your medications and allergies with you at all times  Call your pharmacy at  least 1 week in advance to refill prescriptions  Do not mix any prescribed pain medicine with alcohol  Do not drive any motor vehicles while taking pain medication.  Take medications with food.  Do not retake a pain medication if you vomit after taking it.  Activity: no lifting more than 15 pounds until instructed by your doctor.   Dressing Care Instruction (if applicable):              Remove operative dressings in 48 hours.  May Shower-  Call office if any questions regarding this activity.  Dry Dressing as needed to operative site.  Drain care instructions provided to you in the hospital.   Follow-up appointments (date to return to physician): Call for appointment with Dr. Sherri Rad, MD at [phone] or [phone]  If need MD on call after hours and on weekends call Hospital operator at [phone] as ask to speak to Surgeon on call for [facility].  Call Surgeon if you have:  Temperature greater than 100.4  Persistent nausea and vomiting  Severe uncontrolled pain  Redness, tenderness, or signs of infection (pain, swelling, redness, odor or green/yellow discharge around the site)  Difficulty breathing, headache or visual disturbances  Hives  Persistent dizziness or light-headedness  Extreme fatigue  Any other questions or concerns you may have after discharge  In an emergency, call 911 or go to an Emergency Department at a nearby hospital  Diet:  Resume your usual diet.  Avoid spicy, greasy or heavy foods.  If you have nausea or vomiting, go back to liquids.  If you cannot keep liquids down, call your doctor.  Avoid alcohol consumption while on prescription pain medications. Good nutrition promotes healing. Increase fiber and fluids.     I understand and acknowledge receipt of the above instructions.                                                                                                                                        Patient  or Guardian Signature                                                                    Date/Time  Physician's or R.N.'s Signature                                                                  Date/Time  The discharge instructions have been reviewed with the patient and/or Family Member/Parent/Guardian.  Patient and/or Family Member/Parent/Guardian signed and retained a printed copy.

## 2014-08-23 NOTE — Progress Notes (Signed)
[place] at [place] NAME: Joseph Flowers    MR#:  [id number]  DATE OF BIRTH:  23-Dec-1927  SUBJECTIVE:  Patient tolerated his diet well. As per patient he may be discharged this afternoon by surgery. Patient denies any shortness of breath or chest pain. REVIEW OF SYSTEMS:    Review of Systems  Constitutional: Negative for fever, chills and malaise/fatigue.  HENT: Negative for sore throat.   Eyes: Negative for blurred vision.  Respiratory: Negative for cough, hemoptysis, shortness of breath and wheezing.   Cardiovascular: Negative for chest pain, palpitations and leg swelling.  Gastrointestinal: Negative for nausea, vomiting, abdominal pain, diarrhea and blood in stool.       Improved  Genitourinary: Negative for dysuria, urgency and frequency.  Musculoskeletal: Negative for back pain.  Neurological: Negative for dizziness, tremors and headaches.  Endo/Heme/Allergies: Does not bruise/bleed easily.  Psychiatric/Behavioral: Negative for depression.    Tolerating Diet: yes    DRUG ALLERGIES:  No Known Allergies  VITALS:  Blood pressure 162/65, pulse 84, temperature 97.5 F (36.4 C), temperature source Oral, resp. rate 17, height 5\' 10"  (1.778 m), weight 93.441 kg (206 lb), SpO2 100 %.  PHYSICAL EXAMINATION:   Physical Exam  Constitutional: He is oriented to person, place, and time and well-developed, well-nourished, and in no distress. No distress.  HENT:  Head: Normocephalic.  Eyes: No scleral icterus.  Neck: Normal range of motion. Neck supple. No JVD present. No tracheal deviation present.  Cardiovascular: Normal rate, regular rhythm and normal heart sounds.  Exam reveals no gallop and no friction rub.   No murmur heard. Pulmonary/Chest: Effort normal and breath sounds normal. No respiratory distress. He has no wheezes. He has no rales. He exhibits no tenderness.  Abdominal: Bowel sounds are normal. He exhibits no mass.  There is no tenderness. There is no rebound and no guarding.  Hypoactive BS  Musculoskeletal: Normal range of motion. He exhibits no edema.  Neurological: He is alert and oriented to person, place, and time.  Skin: Skin is warm. No rash noted. No erythema.  Psychiatric: Affect and judgment normal.      LABORATORY PANEL:   CBC  Recent Labs Lab 08/21/14 0432  WBC 10.0  HGB 12.8*  HCT 37.8*  PLT 203   ------------------------------------------------------------------------------------------------------------------  Chemistries   Recent Labs Lab 08/18/14 1318 08/19/14 0409  08/23/14 0811  NA 135 139  < > 140  K 4.7 4.1  < > 3.5  CL 97* 102  < > 111  CO2 27 30  < > 23  GLUCOSE 233* 140*  < > 142*  BUN 14 14  < > 14  CREATININE 1.24 1.02  < > 0.86  CALCIUM 9.5 8.8*  < > 8.0*  MG  --  2.2  --   --   AST 48*  --   --   --   ALT 43  --   --   --   ALKPHOS 64  --   --   --   BILITOT 1.2  --   --   --   < > = values in this interval not displayed. ------------------------------------------------------------------------------------------------------------------  Cardiac Enzymes  Recent Labs Lab 08/18/14 1314  TROPONINI <0.03   ------------------------------------------------------------------------------------------------------------------  RADIOLOGY:  Ct Abdomen Pelvis W Contrast  08/18/2014     IMPRESSION: 1. Findings compatible with small bowel obstruction. Transition point is in the right abdomen proximal to the terminal ileum. Finding may be secondary  to adhesions. 2. Again noted is abnormal soft tissue within the small bowel mesenteric compatible with treated lymphoma. 3. Aortic atherosclerosis 4. Hepatic steatosis. 5. Gallstones.   Electronically Signed   By: Kerby Moors M.D.   On: 08/18/2014 17:46   Dg Abd 2 Views   IMPRESSION: Persistent high-grade small bowel obstruction.  No evidence for free air.   Electronically Signed   By: Nolon Nations M.D.   On:  08/20/2014 09:08     ASSESSMENT AND PLAN:   This is a 81 year old male with a history of diabetes and hypertension who presented with small bowel obstruction.  1. Diabetes mellitus without complication: Patient may resume his outpatient medications if he is being discharged today.  2. Essential hypertension: Blood pressure is acceptable. Patient is on ramipril and metoprolol. Continue these medications.   3. Hyperlipidemia: Patient may resume his statin medication at discharge.   4. BPH: Continue finasteride  5. Small bowel obstruction: As per surgery consultation patient may be discharged later this afternoon.  I will sign off. Please, if he have any further questions. Thank you for allowing me to per spray in the care of the patient.       Management plans discussed with the patient and he is in agreement.  CODE STATUS: FULL  TOTAL TIME TAKING CARE OF THIS PATIENT: 20 minutes.   Greater than 50% counseling and coordination of care  POSSIBLE D/C today DEPENDING ON CLINICAL CONDITION.   Tymere Depuy M.D on 08/23/2014 at 10:24 AM  Between 7am to 6pm - Pager - [phone] After 6pm go to [email or web address] - password [email or web address] [facility]  [place] [facility]  Office  [phone]  CC: Primary care physician; Adrian Prows, MD

## 2014-08-23 NOTE — Progress Notes (Signed)
[facility]   PATIENT NAME: Joseph Flowers    MR#:  [id number]  DATE OF BIRTH:  01/22/1928  SUBJECTIVE:  Patient continues to feel better. He's had continued passage of flatus and several loose bowel movements. He has tolerated a clear liquid diet with no nausea vomiting or abdominal pain or distention.  REVIEW OF SYSTEMS:   Review of Systems  Gastrointestinal: Positive for diarrhea. Negative for vomiting, abdominal pain and constipation.  Genitourinary: Negative for dysuria and urgency.  All other systems reviewed and are negative.   DRUG ALLERGIES:  No Known Allergies  VITALS:  Blood pressure 129/56, pulse 73, temperature 97.5 F (36.4 C), temperature source Oral, resp. rate 17, height 5\' 10"  (1.778 m), weight 93.441 kg (206 lb), SpO2 100 %.  PHYSICAL EXAMINATION:  GENERAL:  81 y.o.-year-old patient lying in the bed with no acute distress.  EYES: Pupils equal, round, reactive to light and accommodation. No scleral icterus. Extraocular muscles intact.  HEENT: Head atraumatic, normocephalic. Oropharynx and nasopharynx clear.  NECK:  Supple, no jugular venous distention. No thyroid enlargement, no tenderness.  LUNGS: Normal breath sounds bilaterally, no wheezing, rales,rhonchi or crepitation. No use of accessory muscles of respiration.  CARDIOVASCULAR: S1, S2 normal. No murmurs, rubs, or gallops.  ABDOMEN: Soft, nontender, nondistended. Bowel sounds present. No organomegaly or mass. The priorly noted tympany yesterday has now resolved. EXTREMITIES: No pedal edema, cyanosis, or clubbing.  NEUROLOGIC: Cranial nerves II through XII are intact. Muscle strength 5/5 in all extremities. Sensation intact. Gait not checked.  PSYCHIATRIC: The patient is alert and oriented x 3.  SKIN: No obvious rash, lesion, or ulcer.     ASSESSMENT AND PLAN:   81 year-old white male with a history of lymphoma now with resolved small bowel obstruction likely adhesive in nature. At present he  has no signs of ongoing bowel obstruction.  Plan is for regular diet and decrease his IV fluids and potential discharge later this afternoon.

## 2014-08-23 NOTE — Progress Notes (Signed)
PHARMACIST - PHYSICIAN COMMUNICATION  CONCERNING: IV to Oral Route Change Policy  RECOMMENDATION: This patient is receiving PROTONIX  by the intravenous route.  Based on criteria approved by the Pharmacy and Therapeutics Committee, the intravenous medication(s) is/are being converted to the equivalent oral dose form(s).   DESCRIPTION: These criteria include:  The patient is eating (either orally or via tube) and/or has been taking other orally administered medications for a least 24 hours  The patient has no evidence of active gastrointestinal bleeding or impaired GI absorption (gastrectomy, short bowel, patient on TNA or NPO).  If you have questions about this conversion, please contact the Pharmacy Department  []   [phone] )  [facility] [x]   [phone] )  [facility] []   [phone] )  [facility] []   [phone] )  [facility] []   [phone] )  [place] PharmD Clinical Pharmacist 08/23/2014

## 2014-08-23 NOTE — Care Management (Signed)
Important Message  Patient Details  Name: Joseph Flowers MRN: [id number] Date of Birth: April 06, 1927   Medicare Important Message Given:  Yes-third notification given    [name] A Allmond 08/23/2014, 12:11 PM

## 2014-08-23 NOTE — Discharge Summary (Signed)
Physician Discharge Summary  Patient ID: Joseph Flowers MRN: [id number] DOB/AGE: 1927/12/27 81 y.o.  Admit date: 08/18/2014 Discharge date: 08/23/2014  Admission Diagnoses: Adhesive small bowel obstruction history of lymphoma.  Discharge Diagnoses:  Active Problems:   Intestinal obstruction due to adhesions   SBO (small bowel obstruction)   Bowel obstruction  history of lymphoma   Discharged Condition: stable and improved.  Hospital Course: The patient was admitted. Nasogastric tube was placed. Serial abdominal examination and x-rays demonstrated resolution of his partial small bowel obstruction. The patient's diet was able to be advanced. This well. He was discharged home in stable condition on hospital day #4.  Consults: none  Disposition:  home with self-care.     Medication List    ASK your doctor about these medications        aspirin 325 MG tablet  Take 1 tablet by mouth daily.     finasteride 5 MG tablet  Commonly known as:  PROSCAR  Take 1 tablet by mouth daily.     furosemide 20 MG tablet  Commonly known as:  LASIX  Take 1 tablet by mouth daily.     glimepiride 1 MG tablet  Commonly known as:  AMARYL  Take 1 tablet by mouth daily.     metFORMIN 500 MG tablet  Commonly known as:  GLUCOPHAGE  Take 500 mg by mouth daily with breakfast.     metoprolol succinate 50 MG 24 hr tablet  Commonly known as:  TOPROL-XL  Take 1 tablet by mouth daily.     ramipril 10 MG capsule  Commonly known as:  ALTACE  Take 1 capsule by mouth daily.     simvastatin 20 MG tablet  Commonly known as:  ZOCOR  Take 1 tablet by mouth daily.           Follow-up Information    Follow up with Sherri Rad, MD In 1 week.   Specialties:  Surgery, Radiology   Contact information:   [address] [place]  [address] [phone]       Signed: Sherri Rad 08/23/2014, 2:53 PM

## 2014-08-23 NOTE — Plan of Care (Deleted)
Problem: Phase II Progression Outcomes Goal: Progress activity as tolerated unless otherwise ordered Patient is stable with son at bedside overnight. Patient has hypoactive bowel sound and distended abdomen. One time dose of suppository was administered. Patient is hemodynamically stable with VS WDL for patient. Patient surgical dressing is dry and intact with old drainage. Patient continue to have increase activity tolerance with PRN pain management. Will continue to monitor

## 2014-08-30 ENCOUNTER — Ambulatory Visit (INDEPENDENT_AMBULATORY_CARE_PROVIDER_SITE_OTHER): Payer: Medicare Other | Admitting: Surgery

## 2014-08-30 ENCOUNTER — Encounter: Payer: Self-pay | Admitting: Surgery

## 2014-08-30 VITALS — BP 131/75 | HR 69 | Temp 98.2°F | Resp 18 | Ht 70.0 in | Wt 205.0 lb

## 2014-08-30 DIAGNOSIS — K565 Intestinal adhesions [bands], unspecified as to partial versus complete obstruction: Secondary | ICD-10-CM

## 2014-08-30 NOTE — Progress Notes (Signed)
Outpatient Surgical Follow Up  08/30/2014  Joseph Flowers is an 81 y.o. male.   CC: Small bowel obstruction  HPI: Patient was recently admitted the hospital by Dr. Rexene Edison with a diagnosis of bowel obstruction which apparently resolved with nasogastric suction.  Past Medical History  Diagnosis Date  . Diabetes mellitus without complication   . Hypertension   . Hyperlipemia   . CHF (congestive heart failure)   . Cancer      lymphoma    No past surgical history on file.  No family history on file.  Social History:  reports that he has never smoked. He has never used smokeless tobacco. He reports that he does not drink alcohol or use illicit drugs.  Allergies: No Known Allergies  Medications reviewed.   Review of Systems:   Review of Systems  Constitutional: Negative for fever and chills.  HENT: Negative.   Eyes: Negative.   Respiratory: Negative.   Cardiovascular: Negative.   Gastrointestinal: Positive for diarrhea. Negative for heartburn, nausea, vomiting, abdominal pain, constipation, blood in stool and melena.  Genitourinary: Negative.   Musculoskeletal: Negative.   Skin: Negative.   Neurological: Negative.   Endo/Heme/Allergies: Negative.   Psychiatric/Behavioral: Negative.      Physical Exam:  BP 131/75 mmHg  Pulse 69  Temp(Src) 98.2 F (36.8 C) (Oral)  Resp 18  Ht 5\' 10"  (1.778 m)  Wt 205 lb (92.987 kg)  BMI 29.41 kg/m2  Physical Exam    No results found for this or any previous visit (from the past 48 hour(s)). No results found.  Assessment/Plan:  Small bowel obstruction which resolved in the hospital with nasogastric suction. Discussed with him the pathophysiology of bowel obstructions due to adhesions. He will follow up on an as-needed basis.  Also of note the patient has had a CABG in 2001 and subsequent to that he has had left leg edema following his saphenous vein harvest. He has also had multiple bouts of cellulitis related to  that left leg. Currently he has a swollen left leg but it is nothing new. He has no calf tenderness. There is no erythema. With that in mind I do not believe he needs additional workup for a long-standing post saphenous vein harvesting edema.  Follow-up with [place] on an  as-needed basis  Florene Glen, MD, FACS

## 2014-10-05 ENCOUNTER — Other Ambulatory Visit: Payer: Self-pay | Admitting: *Deleted

## 2014-10-05 DIAGNOSIS — C8290 Follicular lymphoma, unspecified, unspecified site: Secondary | ICD-10-CM

## 2014-10-06 ENCOUNTER — Inpatient Hospital Stay (HOSPITAL_BASED_OUTPATIENT_CLINIC_OR_DEPARTMENT_OTHER): Payer: Medicare Other | Admitting: Internal Medicine

## 2014-10-06 ENCOUNTER — Inpatient Hospital Stay: Payer: Medicare Other | Attending: Internal Medicine

## 2014-10-06 VITALS — BP 119/70 | HR 66 | Temp 97.8°F | Resp 18 | Ht 70.0 in | Wt 201.9 lb

## 2014-10-06 DIAGNOSIS — Z8572 Personal history of non-Hodgkin lymphomas: Secondary | ICD-10-CM | POA: Insufficient documentation

## 2014-10-06 DIAGNOSIS — I10 Essential (primary) hypertension: Secondary | ICD-10-CM | POA: Diagnosis not present

## 2014-10-06 DIAGNOSIS — Z79899 Other long term (current) drug therapy: Secondary | ICD-10-CM | POA: Diagnosis not present

## 2014-10-06 DIAGNOSIS — E119 Type 2 diabetes mellitus without complications: Secondary | ICD-10-CM | POA: Diagnosis not present

## 2014-10-06 DIAGNOSIS — Z9221 Personal history of antineoplastic chemotherapy: Secondary | ICD-10-CM | POA: Diagnosis not present

## 2014-10-06 DIAGNOSIS — I509 Heart failure, unspecified: Secondary | ICD-10-CM | POA: Insufficient documentation

## 2014-10-06 DIAGNOSIS — I2510 Atherosclerotic heart disease of native coronary artery without angina pectoris: Secondary | ICD-10-CM | POA: Insufficient documentation

## 2014-10-06 DIAGNOSIS — C8290 Follicular lymphoma, unspecified, unspecified site: Secondary | ICD-10-CM

## 2014-10-06 DIAGNOSIS — Z951 Presence of aortocoronary bypass graft: Secondary | ICD-10-CM | POA: Diagnosis not present

## 2014-10-06 DIAGNOSIS — C8590 Non-Hodgkin lymphoma, unspecified, unspecified site: Secondary | ICD-10-CM

## 2014-10-06 LAB — CBC WITH DIFFERENTIAL/PLATELET
Basophils Absolute: 0.1 10*3/uL (ref 0–0.1)
Basophils Relative: 1 %
Eosinophils Absolute: 0.3 10*3/uL (ref 0–0.7)
Eosinophils Relative: 4 %
HEMATOCRIT: 41.6 % (ref 40.0–52.0)
Hemoglobin: 14.2 g/dL (ref 13.0–18.0)
LYMPHS PCT: 43 %
Lymphs Abs: 3.3 10*3/uL (ref 1.0–3.6)
MCH: 30.3 pg (ref 26.0–34.0)
MCHC: 34 g/dL (ref 32.0–36.0)
MCV: 89.1 fL (ref 80.0–100.0)
MONOS PCT: 8 %
Monocytes Absolute: 0.6 10*3/uL (ref 0.2–1.0)
NEUTROS ABS: 3.4 10*3/uL (ref 1.4–6.5)
Neutrophils Relative %: 44 %
PLATELETS: 235 10*3/uL (ref 150–440)
RBC: 4.67 MIL/uL (ref 4.40–5.90)
RDW: 13.1 % (ref 11.5–14.5)
WBC: 7.7 10*3/uL (ref 3.8–10.6)

## 2014-10-06 LAB — LACTATE DEHYDROGENASE: LDH: 144 U/L (ref 98–192)

## 2014-10-06 LAB — HEPATIC FUNCTION PANEL
ALBUMIN: 3.9 g/dL (ref 3.5–5.0)
ALT: 22 U/L (ref 17–63)
AST: 28 U/L (ref 15–41)
Alkaline Phosphatase: 48 U/L (ref 38–126)
BILIRUBIN TOTAL: 0.5 mg/dL (ref 0.3–1.2)
Bilirubin, Direct: 0.1 mg/dL (ref 0.1–0.5)
Indirect Bilirubin: 0.4 mg/dL (ref 0.3–0.9)
Total Protein: 7 g/dL (ref 6.5–8.1)

## 2014-10-06 LAB — CREATININE, SERUM
Creatinine, Ser: 1.03 mg/dL (ref 0.61–1.24)
GFR calc Af Amer: >60 mL/min (ref >60)

## 2014-10-06 NOTE — Progress Notes (Signed)
Patient is here for follow-up of lymphoma. Patient states that he went to [facility] clinic. He went because he was having a lot of trouble with belching, hiccups, and having to spit up phlegm. He states that they did a x-ray and he had some blockage in his intestine. They admitted him and they gave him IV fluids and did a CT Scan. He states that they did not need to do surgery. He states that he feels good now and is able to eat with no problems.

## 2014-10-21 NOTE — Progress Notes (Signed)
[place]  Telephone:([phone] Fax:(336) [phone]     ID: Nadara Eaton OB: 1928/01/05  MR#: [id number]  QVZ#:[id number]  Patient Care Team: Adrian Prows, MD as PCP - General (Infectious Diseases) Bettey Costa, MD as Consulting Physician (Internal Medicine)  CHIEF COMPLAINT/DIAGNOSIS:  Grade 2 follicular lymphoma, stage IIIA (presented with small bowel obstruction treated conservatively, however required surgical biopsy of mesenteric lymph node 06/12/10 for tissue diagnosis). Bone marrow biopsy negative for lymphoma.  PET scan April 2012 - IMPRESSION:   1. There is bulky retroperitoneal and intraperitoneal lymphadenopathy  exhibiting abnormal uptake of the radiopharmaceutical. The PET findings correspond to the CT findings. The pattern is worrisome for lymphoma. There is no splenomegaly.    2. There is a tiny lymph node in the subcarinal region which exhibits mildly increased uptake is worrisome for involvement as well.   3. There are a few tiny gallstones present. There are tiny bilateral pleural effusions.  Completed Rituxan/CVP x 6 cycles on 10/11/10.  Followup PET scan on 10/25/10 -  No F-18 FDG accumulation to suggest residual F-18 FDG avid active malignant disease.  Started maintenance Rituxan therapy on 11/01/10, patient stopped after one dose.     HISTORY OF PRESENT ILLNESS:  Patient returns for continued oncology followup for h/o lymphoma as above. States that he is doing steady and denies any new complaints. He was hospitalized in June for SBO, states he is doing better.  Appetite is good. No new cough, shortness of breath, or chest pain. Chronic swelling in the legs is also doing better. Denies any recurrent swelling or pain in the left groin. Denies feeling any new lymph node masses on self-exam. No fevers or night sweats.     REVIEW OF SYSTEMS:   ROS As in HPI above. In addition, no new headaches or focal weakness.  No new sore throat, cough,  shortness of breath, sputum, hemoptysis or chest pain. No dizziness or palpitation. No abdominal pain, constipation, diarrhea, dysuria or hematuria. No new skin rash or bleeding symptoms. No new paresthesias in extremities.   PAST MEDICAL HISTORY: Reviewed. Past Medical History  Diagnosis Date  . Diabetes mellitus without complication   . Hypertension   . Hyperlipemia   . Cancer      lymphoma in stomach  . CHF (congestive heart failure) 2001          Coronary artery disease status post five vessel CABG about 10 years ago.  Tonsillectomy and adenoidectomy.  Incision and drainage of foot abscess in childhood  Laparoscopic biopsy of mesenteric lymph node 06/12/2010  Port-A-Cath placement 06/12/2010.    Dec 2012 - s/p surgical I&D of large left groin abscess, c/s showed MSSA  PAST SURGICAL HISTORY: Reviewed. Past Surgical History  Procedure Laterality Date  . Coronary artery bypass graft      FAMILY HISTORY: Reviewed. Family History  Problem Relation Age of Onset  . Cancer Mother   . Cancer Father     SOCIAL HISTORY: Reviewed. Social History  Substance Use Topics  . Smoking status: Never Smoker   . Smokeless tobacco: Never Used  . Alcohol Use: No    No Known Allergies  Current Outpatient Prescriptions  Medication Sig Dispense Refill  . aspirin 325 MG tablet Take 1 tablet by mouth daily.    . finasteride (PROSCAR) 5 MG tablet Take 1 tablet by mouth daily.    . furosemide (LASIX) 20 MG tablet Take 1 tablet by mouth daily.    Marland Kitchen glimepiride (AMARYL) 1  MG tablet Take 1 tablet by mouth daily.    . metFORMIN (GLUCOPHAGE) 500 MG tablet Take 500 mg by mouth daily with breakfast.     . metoprolol succinate (TOPROL-XL) 50 MG 24 hr tablet Take 1 tablet by mouth daily.    . ramipril (ALTACE) 10 MG capsule Take 1 capsule by mouth daily.    . simvastatin (ZOCOR) 20 MG tablet Take 1 tablet by mouth daily.     No current facility-administered medications for this visit.    PHYSICAL  EXAM: Filed Vitals:   10/06/14 1413  BP: 119/70  Pulse: 66  Temp: 97.8 F (36.6 C)  Resp: 18     Body mass index is 28.98 kg/(m^2).      GENERAL: Patient is alert and oriented and in no acute distress. There is no icterus. HEENT: EOMs intact. Oral exam negative for thrush or lesions. No cervical lymphadenopathy. CVS: S1S2, regular LUNGS: Bilaterally clear to auscultation, no rhonchi. ABDOMEN: Soft, nontender. No hepatosplenomegaly clinically.  EXTREMITIES: No pedal edema. LYMPHATICS: No palpable adenopathy in axillary or inguinal areas.   LAB RESULTS: Serum LDH 144, LFT unremarkable.    Component Value Date/Time   NA 140 08/23/2014 0811   NA 137 08/09/2012 1636   K 3.5 08/23/2014 0811   K 4.1 08/09/2012 1636   CL 111 08/23/2014 0811   CL 105 08/09/2012 1636   CO2 23 08/23/2014 0811   CO2 26 08/09/2012 1636   GLUCOSE 142* 08/23/2014 0811   GLUCOSE 148* 08/09/2012 1636   BUN 14 08/23/2014 0811   BUN 17 08/09/2012 1636   CREATININE 1.03 10/06/2014 1340   CREATININE 1.28 09/30/2013 1517   CALCIUM 8.0* 08/23/2014 0811   CALCIUM 9.5 08/09/2012 1636   PROT 7.0 10/06/2014 1340   PROT 6.9 09/30/2013 1517   ALBUMIN 3.9 10/06/2014 1340   ALBUMIN 3.5 09/30/2013 1517   AST 28 10/06/2014 1340   AST 16 09/30/2013 1517   ALT 22 10/06/2014 1340   ALT 36 09/30/2013 1517   ALKPHOS 48 10/06/2014 1340   ALKPHOS 62 09/30/2013 1517   BILITOT 0.5 10/06/2014 1340   BILITOT 0.3 09/30/2013 1517   GFRNONAA >60 10/06/2014 1340   GFRNONAA 51* 09/30/2013 1517   GFRNONAA >60 05/30/2011 1437   GFRAA >60 10/06/2014 1340   GFRAA 60* 09/30/2013 1517   GFRAA >60 05/30/2011 1437    Lab Results  Component Value Date   WBC 7.7 10/06/2014   NEUTROABS 3.4 10/06/2014   HGB 14.2 10/06/2014   HCT 41.6 10/06/2014   MCV 89.1 10/06/2014   PLT 235 10/06/2014   STUDIES: 08/18/14 - CT scan abdomen/pelvis.  IMPRESSION: 1. Findings compatible with small bowel obstruction. Transition point is in the  right abdomen proximal to the terminal ileum. Finding may be secondary to adhesions. 2. Again noted is abnormal soft tissue within the small bowel mesenteric compatible with treated lymphoma. 3. Aortic atherosclerosis 4. Hepatic steatosis. 5. Gallstones.  ASSESSMENT / PLAN:  Grade 2 follicular lymphoma, stage IIIA (bone marrow biopsy negative for lymphoma) -  Reviewed labs and d/w patient. He is doing well without any clinical evidence of symptoms to suggest recurrent lymphoma. Serum LDH is normal. No lymphadenopathy or hepatosplenomegaly on exam today. Last CT scan in June 2016 when he was admitted with SBO was negative for recurrent lymphoma, noted was abnormal soft tissue within the small bowel mesenteric compatible with treated lymphoma. He has completed chemotherapy x6 cycles in the past and 1 dose of maintenance Rituxan, then stopped  treatment. Patient made decision in the past not to continue on maintenance Rituxan. He also wants to avoid getting CT scans unless he develops new symptoms or adenopathy, but wants to continue observation/surveillance. Will see him back at 12 months with repeat labs including serum LDH. Patient states that he will take further treatment if he develops recurrent lymphoma. In between visits, he was advised to call or come to the ER in case of any fevers, acute sickness or new symptoms and will be evaluated sooner. He is agreeable to this plan.           Leia Alf, MD   10/21/2014 11:23 PM

## 2015-06-25 DIAGNOSIS — Z87898 Personal history of other specified conditions: Secondary | ICD-10-CM | POA: Insufficient documentation

## 2015-07-14 ENCOUNTER — Encounter: Payer: Self-pay | Admitting: Emergency Medicine

## 2015-07-14 ENCOUNTER — Inpatient Hospital Stay
Admission: EM | Admit: 2015-07-14 | Discharge: 2015-07-20 | DRG: 871 | Disposition: A | Discharge status: Home Health | Payer: Medicare Other | Attending: Internal Medicine | Admitting: Internal Medicine

## 2015-07-14 ENCOUNTER — Emergency Department: Payer: Medicare Other

## 2015-07-14 DIAGNOSIS — Z7984 Long term (current) use of oral hypoglycemic drugs: Secondary | ICD-10-CM

## 2015-07-14 DIAGNOSIS — K8021 Calculus of gallbladder without cholecystitis with obstruction: Secondary | ICD-10-CM | POA: Diagnosis not present

## 2015-07-14 DIAGNOSIS — K209 Esophagitis, unspecified without bleeding: Secondary | ICD-10-CM | POA: Diagnosis present

## 2015-07-14 DIAGNOSIS — Z951 Presence of aortocoronary bypass graft: Secondary | ICD-10-CM

## 2015-07-14 DIAGNOSIS — Z9861 Coronary angioplasty status: Secondary | ICD-10-CM | POA: Diagnosis not present

## 2015-07-14 DIAGNOSIS — I4891 Unspecified atrial fibrillation: Secondary | ICD-10-CM | POA: Diagnosis present

## 2015-07-14 DIAGNOSIS — Z79899 Other long term (current) drug therapy: Secondary | ICD-10-CM | POA: Diagnosis not present

## 2015-07-14 DIAGNOSIS — B967 Clostridium perfringens [C. perfringens] as the cause of diseases classified elsewhere: Secondary | ICD-10-CM | POA: Diagnosis present

## 2015-07-14 DIAGNOSIS — R0602 Shortness of breath: Secondary | ICD-10-CM | POA: Insufficient documentation

## 2015-07-14 DIAGNOSIS — Z8572 Personal history of non-Hodgkin lymphomas: Secondary | ICD-10-CM

## 2015-07-14 DIAGNOSIS — I110 Hypertensive heart disease with heart failure: Secondary | ICD-10-CM | POA: Diagnosis present

## 2015-07-14 DIAGNOSIS — K269 Duodenal ulcer, unspecified as acute or chronic, without hemorrhage or perforation: Secondary | ICD-10-CM | POA: Diagnosis not present

## 2015-07-14 DIAGNOSIS — A4189 Other specified sepsis: Secondary | ICD-10-CM | POA: Diagnosis present

## 2015-07-14 DIAGNOSIS — J90 Pleural effusion, not elsewhere classified: Secondary | ICD-10-CM | POA: Diagnosis not present

## 2015-07-14 DIAGNOSIS — K311 Adult hypertrophic pyloric stenosis: Secondary | ICD-10-CM | POA: Diagnosis present

## 2015-07-14 DIAGNOSIS — I350 Nonrheumatic aortic (valve) stenosis: Secondary | ICD-10-CM | POA: Diagnosis present

## 2015-07-14 DIAGNOSIS — I248 Other forms of acute ischemic heart disease: Secondary | ICD-10-CM | POA: Diagnosis present

## 2015-07-14 DIAGNOSIS — I252 Old myocardial infarction: Secondary | ICD-10-CM

## 2015-07-14 DIAGNOSIS — I2510 Atherosclerotic heart disease of native coronary artery without angina pectoris: Secondary | ICD-10-CM | POA: Diagnosis present

## 2015-07-14 DIAGNOSIS — I480 Paroxysmal atrial fibrillation: Secondary | ICD-10-CM | POA: Diagnosis not present

## 2015-07-14 DIAGNOSIS — K830 Cholangitis: Secondary | ICD-10-CM | POA: Diagnosis present

## 2015-07-14 DIAGNOSIS — E785 Hyperlipidemia, unspecified: Secondary | ICD-10-CM | POA: Diagnosis present

## 2015-07-14 DIAGNOSIS — A419 Sepsis, unspecified organism: Secondary | ICD-10-CM

## 2015-07-14 DIAGNOSIS — K8309 Other cholangitis: Secondary | ICD-10-CM | POA: Diagnosis present

## 2015-07-14 DIAGNOSIS — I5033 Acute on chronic diastolic (congestive) heart failure: Secondary | ICD-10-CM | POA: Diagnosis not present

## 2015-07-14 DIAGNOSIS — Z7982 Long term (current) use of aspirin: Secondary | ICD-10-CM

## 2015-07-14 DIAGNOSIS — E11649 Type 2 diabetes mellitus with hypoglycemia without coma: Secondary | ICD-10-CM | POA: Diagnosis present

## 2015-07-14 DIAGNOSIS — I5030 Unspecified diastolic (congestive) heart failure: Secondary | ICD-10-CM | POA: Diagnosis not present

## 2015-07-14 DIAGNOSIS — R17 Unspecified jaundice: Secondary | ICD-10-CM | POA: Diagnosis not present

## 2015-07-14 HISTORY — DX: Presence of aortocoronary bypass graft: Z95.1

## 2015-07-14 HISTORY — DX: Unspecified diastolic (congestive) heart failure: I50.30

## 2015-07-14 HISTORY — DX: Atherosclerotic heart disease of native coronary artery without angina pectoris: I25.10

## 2015-07-14 HISTORY — DX: Unspecified atrial fibrillation: I48.91

## 2015-07-14 LAB — URINALYSIS COMPLETE WITH MICROSCOPIC (ARMC ONLY)
BACTERIA UA: NONE SEEN
BILIRUBIN URINE: NEGATIVE
LEUKOCYTES UA: NEGATIVE
NITRITE: NEGATIVE
PH: 5 (ref 5.0–8.0)
Protein, ur: 30 mg/dL — AB
SQUAMOUS EPITHELIAL / LPF: NONE SEEN
Specific Gravity, Urine: 1.026 (ref 1.005–1.030)

## 2015-07-14 LAB — CBC WITH DIFFERENTIAL/PLATELET
Basophils Absolute: 0 K/uL (ref 0–0.1)
Basophils Relative: 0 %
Eosinophils Absolute: 0 K/uL (ref 0–0.7)
Eosinophils Relative: 0 %
HCT: 39.1 % — ABNORMAL LOW (ref 40.0–52.0)
Hemoglobin: 13.2 g/dL (ref 13.0–18.0)
Lymphocytes Relative: 2 %
Lymphs Abs: 0.2 K/uL — ABNORMAL LOW (ref 1.0–3.6)
MCH: 29.6 pg (ref 26.0–34.0)
MCHC: 33.8 g/dL (ref 32.0–36.0)
MCV: 87.4 fL (ref 80.0–100.0)
Monocytes Absolute: 0.4 K/uL (ref 0.2–1.0)
Monocytes Relative: 3 %
Neutro Abs: 11.1 K/uL — ABNORMAL HIGH (ref 1.4–6.5)
Neutrophils Relative %: 95 %
Platelets: 185 K/uL (ref 150–440)
RBC: 4.47 MIL/uL (ref 4.40–5.90)
RDW: 13.1 % (ref 11.5–14.5)
WBC: 11.7 K/uL — ABNORMAL HIGH (ref 3.8–10.6)

## 2015-07-14 LAB — COMPREHENSIVE METABOLIC PANEL WITH GFR
ALT: 466 U/L — ABNORMAL HIGH (ref 17–63)
AST: 545 U/L — ABNORMAL HIGH (ref 15–41)
Albumin: 3.9 g/dL (ref 3.5–5.0)
Alkaline Phosphatase: 133 U/L — ABNORMAL HIGH (ref 38–126)
Anion gap: 13 (ref 5–15)
BUN: 10 mg/dL (ref 6–20)
CO2: 21 mmol/L — ABNORMAL LOW (ref 22–32)
Calcium: 9.1 mg/dL (ref 8.9–10.3)
Chloride: 100 mmol/L — ABNORMAL LOW (ref 101–111)
Creatinine, Ser: 1.03 mg/dL (ref 0.61–1.24)
GFR calc Af Amer: >60 mL/min
GFR calc non Af Amer: >60 mL/min
Glucose, Bld: 351 mg/dL — ABNORMAL HIGH (ref 65–99)
Potassium: 4.5 mmol/L (ref 3.5–5.1)
Sodium: 134 mmol/L — ABNORMAL LOW (ref 135–145)
Total Bilirubin: 4.4 mg/dL — ABNORMAL HIGH (ref 0.3–1.2)
Total Protein: 7.3 g/dL (ref 6.5–8.1)

## 2015-07-14 LAB — LACTIC ACID, PLASMA
LACTIC ACID, VENOUS: 2.5 mmol/L — AB (ref 0.5–2.0)
Lactic Acid, Venous: 4.6 mmol/L (ref 0.5–2.0)
Lactic Acid, Venous: 4.1 mmol/L (ref 0.5–2.0)

## 2015-07-14 LAB — LIPASE, BLOOD: LIPASE: 20 U/L (ref 11–51)

## 2015-07-14 LAB — GLUCOSE, CAPILLARY
Glucose-Capillary: 241 mg/dL — ABNORMAL HIGH (ref 65–99)
Glucose-Capillary: 173 mg/dL — ABNORMAL HIGH (ref 65–99)

## 2015-07-14 LAB — TROPONIN I
Troponin I: 0.03 ng/mL
Troponin I: 0.04 ng/mL — ABNORMAL HIGH

## 2015-07-14 MED ORDER — FINASTERIDE 5 MG PO TABS
5.0000 mg | ORAL_TABLET | Freq: Every day | ORAL | Status: DC
  Administered 2015-07-14 – 2015-07-20 (×6): 5 mg via ORAL
  Filled 2015-07-14 (×6): qty 1

## 2015-07-14 MED ORDER — BISACODYL 10 MG RE SUPP: 10.0000 mg | Freq: Every day | RECTAL | Status: DC | PRN

## 2015-07-14 MED ORDER — ACETAMINOPHEN 650 MG RE SUPP: 650.0000 mg | Freq: Four times a day (QID) | RECTAL | Status: DC | PRN

## 2015-07-14 MED ORDER — SODIUM CHLORIDE 0.9 % IV SOLN
Freq: Once | INTRAVENOUS | Status: AC
  Administered 2015-07-14: 13:00:00 via INTRAVENOUS

## 2015-07-14 MED ORDER — MORPHINE SULFATE (PF) 2 MG/ML IV SOLN: 2.0000 mg | INTRAVENOUS | Status: DC | PRN

## 2015-07-14 MED ORDER — PIPERACILLIN-TAZOBACTAM 3.375 G IVPB 30 MIN
3.3750 g | Freq: Once | INTRAVENOUS | Status: AC
  Administered 2015-07-14: 3.375 g via INTRAVENOUS
  Filled 2015-07-14: qty 50

## 2015-07-14 MED ORDER — MORPHINE SULFATE (PF) 4 MG/ML IV SOLN: 4.0000 mg | Freq: Once | INTRAVENOUS | Status: DC

## 2015-07-14 MED ORDER — SODIUM CHLORIDE 0.9 % IV BOLUS (SEPSIS)
1000.0000 mL | Freq: Once | INTRAVENOUS | Status: AC
  Administered 2015-07-14: 1000 mL via INTRAVENOUS

## 2015-07-14 MED ORDER — PIPERACILLIN-TAZOBACTAM 3.375 G IVPB
3.3750 g | Freq: Three times a day (TID) | INTRAVENOUS | Status: DC
  Administered 2015-07-14 – 2015-07-17 (×8): 3.375 g via INTRAVENOUS
  Filled 2015-07-14 (×10): qty 50

## 2015-07-14 MED ORDER — IOPAMIDOL (ISOVUE-300) INJECTION 61%
100.0000 mL | Freq: Once | INTRAVENOUS | Status: AC | PRN
  Administered 2015-07-14: 100 mL via INTRAVENOUS

## 2015-07-14 MED ORDER — POLYETHYLENE GLYCOL 3350 17 G PO PACK: 17.0000 g | PACK | Freq: Every day | ORAL | Status: DC | PRN

## 2015-07-14 MED ORDER — ENOXAPARIN SODIUM 40 MG/0.4ML ~~LOC~~ SOLN
40.0000 mg | SUBCUTANEOUS | Status: DC
  Administered 2015-07-14 – 2015-07-19 (×6): 40 mg via SUBCUTANEOUS
  Filled 2015-07-14 (×6): qty 0.4

## 2015-07-14 MED ORDER — INSULIN ASPART 100 UNIT/ML ~~LOC~~ SOLN
0.0000 [IU] | SUBCUTANEOUS | Status: DC
  Administered 2015-07-14: 17:00:00 5 [IU] via SUBCUTANEOUS
  Administered 2015-07-14 – 2015-07-15 (×2): 3 [IU] via SUBCUTANEOUS
  Administered 2015-07-16: 2 [IU] via SUBCUTANEOUS
  Administered 2015-07-16 (×3): 3 [IU] via SUBCUTANEOUS
  Administered 2015-07-17: 20:00:00 2 [IU] via SUBCUTANEOUS
  Administered 2015-07-17 (×2): 3 [IU] via SUBCUTANEOUS
  Administered 2015-07-17: 2 [IU] via SUBCUTANEOUS
  Administered 2015-07-17: 01:00:00 5 [IU] via SUBCUTANEOUS
  Administered 2015-07-18 (×2): 3 [IU] via SUBCUTANEOUS
  Administered 2015-07-18 – 2015-07-19 (×2): 2 [IU] via SUBCUTANEOUS
  Administered 2015-07-19: 16:00:00 3 [IU] via SUBCUTANEOUS
  Administered 2015-07-19: 05:00:00 2 [IU] via SUBCUTANEOUS
  Administered 2015-07-19: 12:00:00 3 [IU] via SUBCUTANEOUS
  Administered 2015-07-19: 20:00:00 5 [IU] via SUBCUTANEOUS
  Administered 2015-07-19 – 2015-07-20 (×2): 2 [IU] via SUBCUTANEOUS
  Filled 2015-07-14 (×2): qty 3
  Filled 2015-07-14 (×3): qty 2
  Filled 2015-07-14 (×2): qty 3
  Filled 2015-07-14: qty 5
  Filled 2015-07-14: qty 3
  Filled 2015-07-14 (×3): qty 2
  Filled 2015-07-14: qty 5
  Filled 2015-07-14: qty 3
  Filled 2015-07-14: qty 2
  Filled 2015-07-14 (×2): qty 3
  Filled 2015-07-14: qty 2
  Filled 2015-07-14: qty 3
  Filled 2015-07-14: qty 5
  Filled 2015-07-14 (×2): qty 3

## 2015-07-14 MED ORDER — METOPROLOL TARTRATE 5 MG/5ML IV SOLN
5.0000 mg | INTRAVENOUS | Status: DC | PRN
  Administered 2015-07-16: 5 mg via INTRAVENOUS
  Filled 2015-07-14 (×2): qty 5

## 2015-07-14 MED ORDER — METOPROLOL SUCCINATE ER 25 MG PO TB24
25.0000 mg | ORAL_TABLET | Freq: Two times a day (BID) | ORAL | Status: DC
  Administered 2015-07-14 – 2015-07-18 (×7): 25 mg via ORAL
  Filled 2015-07-14 (×7): qty 1

## 2015-07-14 MED ORDER — ALBUTEROL SULFATE (2.5 MG/3ML) 0.083% IN NEBU: 2.5000 mg | INHALATION_SOLUTION | RESPIRATORY_TRACT | Status: DC | PRN

## 2015-07-14 MED ORDER — ONDANSETRON HCL 4 MG PO TABS: 4.0000 mg | ORAL_TABLET | Freq: Four times a day (QID) | ORAL | Status: DC | PRN

## 2015-07-14 MED ORDER — SODIUM CHLORIDE 0.9 % IV SOLN
INTRAVENOUS | Status: DC
  Administered 2015-07-14 – 2015-07-15 (×2): via INTRAVENOUS

## 2015-07-14 MED ORDER — ACETAMINOPHEN 325 MG PO TABS: 650.0000 mg | ORAL_TABLET | Freq: Four times a day (QID) | ORAL | Status: DC | PRN

## 2015-07-14 MED ORDER — RAMIPRIL 10 MG PO CAPS
10.0000 mg | ORAL_CAPSULE | Freq: Every day | ORAL | Status: DC
  Administered 2015-07-15 – 2015-07-20 (×5): 10 mg via ORAL
  Filled 2015-07-14 (×7): qty 1

## 2015-07-14 MED ORDER — HYDROCODONE-ACETAMINOPHEN 5-325 MG PO TABS: 1.0000 | ORAL_TABLET | ORAL | Status: DC | PRN

## 2015-07-14 MED ORDER — ONDANSETRON HCL 4 MG/2ML IJ SOLN: 4.0000 mg | Freq: Four times a day (QID) | INTRAMUSCULAR | Status: DC | PRN

## 2015-07-14 MED ORDER — DIATRIZOATE MEGLUMINE & SODIUM 66-10 % PO SOLN
15.0000 mL | Freq: Once | ORAL | Status: AC
  Administered 2015-07-14: 15 mL via ORAL

## 2015-07-14 MED ORDER — ONDANSETRON HCL 4 MG/2ML IJ SOLN: 4.0000 mg | Freq: Once | INTRAMUSCULAR | Status: DC

## 2015-07-14 NOTE — Consult Note (Signed)
Pharmacy Antibiotic Note  Joseph Flowers is a 82 y.o. male admitted on 07/14/2015 with intra-abdominal infection.  Pharmacy has been consulted for Zosyn dosing.  Plan: Zosyn 3.375g IV q8h (4 hour infusion). Patient has good renal function.  Height: 5\' 10"  ([id number] cm) Weight: 201 lb (91.173 kg) IBW/kg (Calculated) : 73  Temp (24hrs), Avg:97.5 F (36.4 C), Min:97.5 F (36.4 C), Max:97.5 F (36.4 C)   Recent Labs Lab 07/14/15 1039 07/14/15 1304  WBC 11.7*  --   CREATININE 1.03  --   LATICACIDVEN 4.6* 4.1*    Estimated Creatinine Clearance: 59.6 mL/min (by C-G formula based on Cr of 1.03).    No Known Allergies  Antimicrobials this admission: Zosyn 5/20 >>  Microbiology results: 5/20 BCx x2: pending   Thank you for allowing pharmacy to be a part of this patient's care.  Roe Coombs, PharmD Pharmacy Resident 07/14/2015

## 2015-07-14 NOTE — ED Provider Notes (Signed)
[facility] Emergency Department Provider Note   ____________________________________________  Time seen: Approximately 9:57 AM  I have reviewed the triage vital signs and the nursing notes.   HISTORY  Chief Complaint Abdominal Pain    HPI Dristin Degon is a 82 y.o. male who reports he developed abdominal pain Thursday after he had a basal cell skin cancer removed from his forehead at [facility]. The pain has gotten worse and it "just hurts" is worse with movement or attempts of bowel at the stooling. Feels something like when he had a small bowel obstruction from adhesions previously but the pain is more intense. he cannot say if it's crampy or sharp or dull and achy etc. Patient is nauseated but having no vomiting. he says he has had some diarrhea. Patient had abdominal surgery for his lymphoma in his stomach about 5 years ago.   Past Medical History  Diagnosis Date  . Diabetes mellitus without complication ([place])   . Hypertension   . Hyperlipemia   . Cancer ([place])      lymphoma in stomach  . CHF (congestive heart failure) ([place]) 2001    Patient Active Problem List   Diagnosis Date Noted  . Bowel obstruction ([place])   . SBO (small bowel obstruction) ([place])   . 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 Coronary artery bypass graft      Current Outpatient Rx  Name  Route  Sig  Dispense  Refill  . aspirin 325 MG tablet   Oral   Take 162.5 mg by mouth 2 (two) times daily.          . finasteride (PROSCAR) 5 MG tablet   Oral   Take 5 mg by mouth daily.          . fluorouracil (EFUDEX) 5 % cream   Topical   Apply 1 application topically 2 (two) times daily. Apply on right cheek for 3 to 4 weeks.         . furosemide (LASIX) 20 MG tablet   Oral   Take 20 mg by mouth daily.          Marland Kitchen glimepiride (AMARYL) 4 MG tablet   Oral   Take 4 mg by mouth daily with  breakfast.         . metFORMIN (GLUCOPHAGE) 500 MG tablet   Oral   Take 500 mg by mouth daily with breakfast.          . metoprolol succinate (TOPROL-XL) 50 MG 24 hr tablet   Oral   Take 25 mg by mouth 2 (two) times daily.          . ramipril (ALTACE) 10 MG capsule   Oral   Take 10 mg by mouth daily.          . simvastatin (ZOCOR) 20 MG tablet   Oral   Take 20 mg by mouth at bedtime.            Allergies Review of patient's allergies indicates no known allergies.  Family History  Problem Relation Age of Onset  . Cancer Mother   . Cancer Father     Social History Social History  Substance Use Topics  . Smoking status: Never Smoker   . Smokeless tobacco: Never Used  . Alcohol Use: No    Review of Systems Constitutional: No fever/chills Eyes: No visual changes. ENT: No sore throat. Cardiovascular: Denies  chest pain. Respiratory: Denies shortness of breath. Gastrointestinal: See history of present illness Genitourinary: Negative for dysuria. Musculoskeletal: Negative for back pain. Skin: Negative for rash. Neurological: Negative for headaches, focal weakness or numbness.  10-point ROS otherwise negative.  ____________________________________________   PHYSICAL EXAM:  VITAL SIGNS: ED Triage Vitals  Enc Vitals Group     BP 07/14/15 0927 165/71 mmHg     Pulse Rate 07/14/15 0927 102     Resp 07/14/15 0927 18     Temp 07/14/15 0927 97.5 F (36.4 C)     Temp Source 07/14/15 0927 Oral     SpO2 07/14/15 0927 98 %     Weight 07/14/15 0927 201 lb (91.173 kg)     Height 07/14/15 0927 5\' 10"  (1.778 m)     Head Cir --      Peak Flow --      Pain Score 07/14/15 0928 8     Pain Loc --      Pain Edu? --      Excl. in [place]? --     Constitutional: Alert and oriented. Well appearing and in no acute distress. Eyes: Conjunctivae are normal. PERRL. EOMI. Head: Atraumatic. Nose: No congestion/rhinnorhea. Mouth/Throat: Mucous membranes are moist.   Oropharynx non-erythematous. Neck: No stridor.   Cardiovascular: Normal rate, regular rhythm. Grossly normal heart sounds.  Good peripheral circulation. Respiratory: Normal respiratory effort.  No retractions. Lungs CTAB. Gastrointestinal: Soft diffusely moderately tender also tender percussion is distended bowel sounds are decreased there is a bruit in the mid abdomen . No CVA tenderness. Musculoskeletal: No lower extremity tenderness nor edema.  No joint effusions. Neurologic:  Normal speech and language. No gross focal neurologic deficits are appreciated. No gait instability. Skin:  Skin is warm, dry and intact. No rash noted. Psychiatric: Mood and affect are normal. Speech and behavior are normal.  ____________________________________________   LABS (all labs ordered are listed, but only abnormal results are displayed)  Labs Reviewed  COMPREHENSIVE METABOLIC PANEL - Abnormal; Notable for the following:    Sodium 134 (*)    Chloride 100 (*)    CO2 21 (*)    Glucose, Bld 351 (*)    AST 545 (*)    ALT 466 (*)    Alkaline Phosphatase 133 (*)    Total Bilirubin 4.4 (*)    All other components within normal limits  LACTIC ACID, PLASMA - Abnormal; Notable for the following:    Lactic Acid, Venous 4.6 (*)    All other components within normal limits  LACTIC ACID, PLASMA - Abnormal; Notable for the following:    Lactic Acid, Venous 4.1 (*)    All other components within normal limits  CBC WITH DIFFERENTIAL/PLATELET - Abnormal; Notable for the following:    WBC 11.7 (*)    HCT 39.1 (*)    Neutro Abs 11.1 (*)    Lymphs Abs 0.2 (*)    All other components within normal limits  URINALYSIS COMPLETEWITH MICROSCOPIC ([facility] ONLY) - Abnormal; Notable for the following:    Color, Urine YELLOW (*)    APPearance CLEAR (*)    Glucose, UA >500 (*)    Ketones, ur 1+ (*)    Hgb urine dipstick 1+ (*)    Protein, ur 30 (*)    All other components within normal limits  TROPONIN I - Abnormal;  Notable for the following:    Troponin I 0.04 (*)    All other components within normal limits  CULTURE, BLOOD (ROUTINE X  2)  CULTURE, BLOOD (ROUTINE X 2)  URINE CULTURE  LIPASE, BLOOD  TROPONIN I   ____________________________________________  EKG  KG read and interpreted by me shows sinus tachycardia rate of 103 normal axis right bundle branch block and nonspecific ST-T wave changes similar to June 2016 ____________________________________________  RADIOLOGY  CT read by radiologist consider cholangitis in the proper clinical setting. ____________________________________________   PROCEDURES    Critical Care performed: 10 minutes  ____________________________________________   INITIAL IMPRESSION / ASSESSMENT AND PLAN / ED COURSE  Pertinent labs & imaging results that were available during my care of the patient were reviewed by me and considered in my medical decision making (see chart for details).  Patient remains with mild diffuse abdominal pain. He does not localize to the right upper quadrant. I will treat him with some Zosyn for the sepsis his LFTs are somewhat elevated SGOT and SGPT are much higher than the alkaline phosphatase however. We will be admitting him. ____________________________________________   FINAL CLINICAL IMPRESSION(S) / ED DIAGNOSES  Final diagnoses:  Sepsis, due to unspecified organism [facility])      NEW MEDICATIONS STARTED DURING THIS VISIT:  New Prescriptions   No medications on file     Note:  This document was prepared using Dragon voice recognition software and may include unintentional dictation errors.    Nena Polio, MD 07/14/15 1420

## 2015-07-14 NOTE — Consult Note (Signed)
Joseph Flowers is a 82 y.o. male  admitted with abdominal pain.  HPI: He appears a bit confused tonight and so I'm not certain that his history as told by the patient is correct. I have reviewed the notes from his admission.  He was admitted with abdominal pain primarily in the upper abdomen but also generalized lower quadrant pain in addition. He had a similar episode in the past which resolved spontaneously this time the symptoms were prolonged and more severe. He was noted to have some elevated transaminases and elevated bilirubin. CT scan demonstrated what appeared to be some haziness and enhancement in the common bile duct. Consistent with possible cholangitis. He denied any nausea or vomiting. He was started on antibiotics admission and he feels better this evening. He does not have any abdominal pain at the present time.  He was admitted last June to the surgical service with a partial small bowel obstruction. His symptoms resolved spontaneously and he was evaluated in the office posthospitalization with no obvious problems. He has had a history of abdominal non-Hodgkin's lymphoma and had a staging minilaparotomy as his only abdominal surgery. He has had coronary artery angioplasty and bypass. He's recently had a basal cell resection from his forehead down at [facility].  Past Medical History  Diagnosis Date  . Diabetes mellitus without complication ([place])   . Hypertension   . Hyperlipemia   . Cancer ([place])      lymphoma in stomach  . CHF (congestive heart failure) ([place]) 2001   Past Surgical History  Procedure Laterality Date  . Coronary artery bypass graft     Social History   Social History  . Marital Status: Married    Spouse Name: N/A  . Number of Children: N/A  . Years of Education: N/A   Social History Main Topics  . Smoking status: Never Smoker   . Smokeless tobacco: Never Used  . Alcohol Use: No  . Drug Use: No  . Sexual Activity: Not  Asked   Other Topics Concern  . None   Social History Narrative    Review of Systems: Review of Systems  Constitutional: Positive for malaise/fatigue. Negative for fever, chills, weight loss and diaphoresis.  HENT: Negative for sore throat.   Eyes: Negative.   Respiratory: Negative for cough, shortness of breath, wheezing and stridor.   Cardiovascular: Positive for leg swelling. Negative for chest pain, palpitations and orthopnea.  Gastrointestinal: Positive for abdominal pain. Negative for heartburn, nausea, vomiting, diarrhea and constipation.  Genitourinary: Negative.   Musculoskeletal: Negative.   Skin:       Multiple skin cancers  Neurological: Negative.  Negative for headaches.  Psychiatric/Behavioral: Negative.     PHYSICAL EXAM: BP 130/65 mmHg  Pulse 105  Temp(Src) 97.8 F (36.6 C) (Oral)  Resp 22  Ht 5\' 10"  (1.778 m)  Wt 92.216 kg (203 lb 4.8 oz)  BMI 29.17 kg/m2  SpO2 99%  Physical Exam  Constitutional: He is oriented to person, place, and time. He appears well-developed and well-nourished.  HENT:  Head: Normocephalic.  Basal cell resection fore head  Eyes: EOM are normal. Pupils are equal, round, and reactive to light.  Neck: Normal range of motion. Neck supple.  Cardiovascular: Normal rate and normal heart sounds.   Pulmonary/Chest: Effort normal and breath sounds normal.  Abdominal: Soft. Bowel sounds are normal. He exhibits no distension. There is no tenderness. There is no rebound and no guarding.  Musculoskeletal: Normal range of motion.  He exhibits edema. He exhibits no tenderness.  Neurological: He is alert and oriented to person, place, and time.  Skin: Skin is warm and dry.  Several areas of recent dermatologic therapy.  Psychiatric: His behavior is normal. Thought content normal.   Abdomen is soft with minimal abdominal distention. He has no abdominal tenderness at this point. He does have active bowel sounds. There is no rebound or  guarding.  Impression/Plan: I independently reviewed his films and laboratory work. He denies any fever or chills prehospitalization is not had any fever while hospitalized. Is no hypotension to suggest any bacteremia. He does have an elevated bilirubin and mildly elevated transaminases. He does have several small gallstones. I will be concerned about the possibility of ductal obstruction. I agree with the planned procedure with consideration for ERCP. I do not see any surgical indications at the present time. Should sphincteroplasty be possible at ERCP he may not need surgery at all. We will continue to follow him while he is hospitalized.   Dia Crawford III, MD  07/14/2015, 8:13 PM

## 2015-07-14 NOTE — H&P (Signed)
[facility] NAME: Jondavid Sinanan    MR#:  [id number]  DATE OF BIRTH:  March 20, 1927  DATE OF ADMISSION:  07/14/2015  PRIMARY CARE PHYSICIAN: Leonel Ramsay, MD   REQUESTING/REFERRING PHYSICIAN: Dr. Cinda Quest  CHIEF COMPLAINT:   Chief Complaint  Patient presents with  . Abdominal Pain    HISTORY OF PRESENT ILLNESS:  Bezalel Senn  is a 81 y.o. male with a known history of Diabetes, hypertension, CHF presents to the emergency room complaining of 2 days of right upper quadrant and epigastric area abdominal pain. Patient has had nausea but no vomiting. 3 episodes of loose stools. No blood. He has had lightheadedness and fatigue. Chills but no fever. Here in the emergency room patient has tachycardia and leukocytosis. CT scan showed cholangitis with stone in the gallbladder fundus. No cholecystitis on CAT scan.  PAST MEDICAL HISTORY:   Past Medical History  Diagnosis Date  . Diabetes mellitus without complication ([place])   . Hypertension   . Hyperlipemia   . 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 CHF (congestive heart failure) ([place]) 2001    PAST SURGICAL HISTORY:   Past Surgical History  Procedure Laterality Date  . Coronary artery bypass graft      SOCIAL HISTORY:   Social History  Substance Use Topics  . Smoking status: Never Smoker   . Smokeless tobacco: Never Used  . Alcohol Use: No    FAMILY HISTORY:   Family History  Problem Relation Age of Onset  . Cancer Mother   . Cancer Father     DRUG ALLERGIES:  No Known Allergies  REVIEW OF SYSTEMS:   Review of Systems  Constitutional: Positive for chills and malaise/fatigue. Negative for fever.  HENT: Negative for sore throat.   Eyes: Negative for blurred vision, double vision and pain.  Respiratory: Negative for cough, hemoptysis, shortness of breath and wheezing.   Cardiovascular: Negative for chest pain, palpitations, orthopnea and leg swelling.   Gastrointestinal: Positive for nausea and abdominal pain. Negative for heartburn, vomiting, diarrhea and constipation.  Genitourinary: Negative for dysuria and hematuria.  Musculoskeletal: Negative for back pain and joint pain.  Skin: Negative for rash.  Neurological: Negative for sensory change, speech change, focal weakness and headaches.  Endo/Heme/Allergies: Does not bruise/bleed easily.  Psychiatric/Behavioral: Negative for depression. The patient is not nervous/anxious.     MEDICATIONS AT HOME:   Prior to Admission medications   Medication Sig Start Date End Date Taking? Authorizing Provider  aspirin 325 MG tablet Take 162.5 mg by mouth 2 (two) times daily.    Yes Historical Provider, MD  finasteride (PROSCAR) 5 MG tablet Take 5 mg by mouth daily.  06/12/14  Yes Historical Provider, MD  fluorouracil (EFUDEX) 5 % cream Apply 1 application topically 2 (two) times daily. Apply on right cheek for 3 to 4 weeks. 06/27/15 06/26/16 Yes Historical Provider, MD  furosemide (LASIX) 20 MG tablet Take 20 mg by mouth daily.  08/11/14  Yes Historical Provider, MD  glimepiride (AMARYL) 4 MG tablet Take 4 mg by mouth daily with breakfast. 06/04/15  Yes Historical Provider, MD  metFORMIN (GLUCOPHAGE) 500 MG tablet Take 500 mg by mouth daily with breakfast.  08/11/14  Yes Historical Provider, MD  metoprolol succinate (TOPROL-XL) 50 MG 24 hr tablet Take 25 mg by mouth 2 (two) times daily.  08/11/14  Yes Historical Provider, MD  ramipril (ALTACE) 10 MG capsule Take 10 mg  by mouth daily.  08/11/14  Yes Historical Provider, MD  simvastatin (ZOCOR) 20 MG tablet Take 20 mg by mouth at bedtime.  08/11/14  Yes Historical Provider, MD     VITAL SIGNS:  Blood pressure 166/106, pulse 116, temperature 97.5 F (36.4 C), temperature source Oral, resp. rate 18, height 5\' 10"  (1.778 m), weight 91.173 kg (201 lb), SpO2 97 %.  PHYSICAL EXAMINATION:  Physical Exam  GENERAL:  82 y.o.-year-old patient lying in the bed with no  acute distress.  EYES: Pupils equal, round, reactive to light and accommodation. No scleral icterus. Extraocular muscles intact.  HEENT: Head atraumatic, normocephalic. Oropharynx and nasopharynx clear. No oropharyngeal erythema, moist oral mucosa  NECK:  Supple, no jugular venous distention. No thyroid enlargement, no tenderness.  LUNGS: Normal breath sounds bilaterally, no wheezing, rales, rhonchi. No use of accessory muscles of respiration.  CARDIOVASCULAR: S1, S2 normal. No murmurs, rubs, or gallops.  ABDOMEN: Soft, nondistended. Bowel sounds present. No organomegaly or mass. Right upper quadrant and epigastric tenderness. EXTREMITIES: No pedal edema, cyanosis, or clubbing. + 2 pedal & radial pulses b/l.   NEUROLOGIC: Cranial nerves II through XII are intact. No focal Motor or sensory deficits appreciated b/l PSYCHIATRIC: The patient is alert and oriented x 3. Good affect.  SKIN: No obvious rash, lesion, or ulcer.   LABORATORY PANEL:   CBC  Recent Labs Lab 07/14/15 1039  WBC 11.7*  HGB 13.2  HCT 39.1*  PLT 185   ------------------------------------------------------------------------------------------------------------------  Chemistries   Recent Labs Lab 07/14/15 1039  NA 134*  K 4.5  CL 100*  CO2 21*  GLUCOSE 351*  BUN 10  CREATININE 1.03  CALCIUM 9.1  AST 545*  ALT 466*  ALKPHOS 133*  BILITOT 4.4*   ------------------------------------------------------------------------------------------------------------------  Cardiac Enzymes  Recent Labs Lab 07/14/15 1304  TROPONINI 0.04*   ------------------------------------------------------------------------------------------------------------------  RADIOLOGY:  Ct Abdomen Pelvis W Contrast  07/14/2015  CLINICAL DATA:  C/o abd pain and cramping x 2 days. Pt reports nausea and diarrhea yesterday. Denies vomiting. 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 EXAM: CT ABDOMEN AND PELVIS WITH CONTRAST TECHNIQUE:  Multidetector CT imaging of the abdomen and pelvis was performed using the standard protocol following bolus administration of intravenous contrast. CONTRAST:  110mL ISOVUE-300 IOPAMIDOL (ISOVUE-300) INJECTION 61% COMPARISON:  08/18/2014 FINDINGS: Lung bases: Minor subsegmental atelectasis and/ or scarring. Heart normal size. Few mildly prominent, but nonenlarged, lymph nodes adjacent to the inferior descending thoracic aorta similar to the prior exam. Hepatobiliary: Fatty infiltration of the liver. No liver mass or focal lesion. Small dependent gallstones. No gallbladder wall thickening. There is enhancement along the wall of the common bile duct. Mild hazy opacity is noted in the fat along the porta hepatis. These findings are new from the prior exam. There is no bile duct dilation. Spleen, pancreas, adrenal glands:  Unremarkable. Kidneys, ureters, bladder: 5 mm low-density lesion in the upper pole the right kidney consistent with a cyst, stable. No other renal masses or lesions, no stones and no hydronephrosis. Normal ureters. Bladder is unremarkable. Lymph nodes:  No pathologically enlarged lymph nodes. Ascites:  None. Gastrointestinal: Scattered left colon diverticula. No diverticulitis. Stomach is unremarkable. Normal small bowel. There is some hazy opacity at the root of the small bowel mesenteries similar to the prior exam. Normal appendix is visualized. Vascular: There are scattered aortic atherosclerotic calcifications with more dense calcification along splenic artery. No aneurysm. Musculoskeletal:  No osteoblastic or osteolytic lesions. IMPRESSION: 1. Mild enhancement of the wall of the  common bile duct with mild adjacent hazy inflammatory type change in the porta hepatis fat. Consider cholangitis in the proper clinical setting. There is no evidence of acute cholecystitis. Small gallstone is evident the gallbladder fundus. 2. There is some focal opacity at the root of the small bowel mesentery, which is  similar to prior exam 3. Hepatic steatosis. 4. Left colon diverticula.  No diverticulitis. Electronically Signed   By: Lajean Manes M.D.   On: 07/14/2015 14:12     IMPRESSION AND PLAN:   * Acute cholangitis with sepsis We will bolus normal saline 1 L stat. He does have history of CHF and need to be cautious with fluids. Repeat lactic acid in 3 hours. IV Zosyn. CT scan shows cholangitis with a gallstone at the fundus of gallbladder. Discussed case with both GI Dr. Allen Norris and surgery Dr. Azalee Course . Patient will need ERCP and later cholecystectomy prior to discharge. Patient is critically ill.  * Diabetes mellitus Patient will be nothing by mouth for ERCP. We'll put him on sliding scale insulin.  * Hypertension Continue patient's metoprolol and ramipril from home. Added Lopressor IV when necessary.  * Chronic congestive heart failure. Unknown diastolic or systolic. Hold Lasix at this time. Monitor for fluid overload as patient is on IV fluids.  * DVT prophylaxis with Lovenox.  All the records are reviewed and case discussed with ED provider. Management plans discussed with the patient, family and they are in agreement.  CODE STATUS: FULL CODE  TOTAL CRITICAL CARE TIME TAKING CARE OF THIS PATIENT: 45 minutes.   Hillary Bow R M.D on 07/14/2015 at 2:43 PM  Between 7am to 6pm - Pager - [phone]  After 6pm go to [email or web address] - password [email or web address] [facility]  [facility]  Office  [phone]  CC: Primary care physician; FITZGERALD, DAVID [name], MD  Note: This dictation was prepared with Dragon dictation along with smaller phrase technology. Any transcriptional errors that result from this process are unintentional.

## 2015-07-14 NOTE — Progress Notes (Signed)
Notified Dr Darvin Neighbours that pt has ED orders for tele and continuous pulse ox. Per MD to keep pt on tele monitor and to d/c continuous pulse ox.

## 2015-07-14 NOTE — ED Notes (Addendum)
Pt to ed with c/o abd pain and cramping x 2 days.  Pt reports nausea and diarrhea yesterday. Denies vomiting.

## 2015-07-14 NOTE — Plan of Care (Signed)
Problem: Physical Regulation: Goal: Ability to maintain clinical measurements within normal limits will improve Outcome: Progressing Pt admitted today from the ED. VSS. Pt reports mild upper abdominal tenderness bilaterally, but declines an intervention. Denies n/v.

## 2015-07-14 NOTE — ED Notes (Signed)
Patient ambulated to and from room commode with a slow, steady gait.

## 2015-07-15 LAB — COMPREHENSIVE METABOLIC PANEL
ALBUMIN: 2.7 g/dL — AB (ref 3.5–5.0)
ALK PHOS: 101 U/L (ref 38–126)
ALT: 269 U/L — AB (ref 17–63)
AST: 223 U/L — ABNORMAL HIGH (ref 15–41)
Anion gap: 5 (ref 5–15)
BUN: 17 mg/dL (ref 6–20)
CALCIUM: 8 mg/dL — AB (ref 8.9–10.3)
CHLORIDE: 111 mmol/L (ref 101–111)
CO2: 21 mmol/L — AB (ref 22–32)
CREATININE: 0.96 mg/dL (ref 0.61–1.24)
GFR calc non Af Amer: >60 mL/min (ref >60)
GLUCOSE: 73 mg/dL (ref 65–99)
Potassium: 4.4 mmol/L (ref 3.5–5.1)
SODIUM: 137 mmol/L (ref 135–145)
Total Bilirubin: 3.4 mg/dL — ABNORMAL HIGH (ref 0.3–1.2)
Total Protein: 5.2 g/dL — ABNORMAL LOW (ref 6.5–8.1)

## 2015-07-15 LAB — BLOOD CULTURE ID PANEL (REFLEXED)
ACINETOBACTER BAUMANNII: NOT DETECTED
CANDIDA ALBICANS: NOT DETECTED
CARBAPENEM RESISTANCE: NOT DETECTED
Candida glabrata: NOT DETECTED
Candida krusei: NOT DETECTED
Candida parapsilosis: NOT DETECTED
Candida tropicalis: NOT DETECTED
ENTEROBACTER CLOACAE COMPLEX: NOT DETECTED
ENTEROBACTERIACEAE SPECIES: NOT DETECTED
ENTEROCOCCUS SPECIES: NOT DETECTED
Escherichia coli: NOT DETECTED
Haemophilus influenzae: NOT DETECTED
Klebsiella oxytoca: NOT DETECTED
Klebsiella pneumoniae: NOT DETECTED
Listeria monocytogenes: NOT DETECTED
METHICILLIN RESISTANCE: NOT DETECTED
NEISSERIA MENINGITIDIS: NOT DETECTED
PSEUDOMONAS AERUGINOSA: NOT DETECTED
Proteus species: NOT DETECTED
STAPHYLOCOCCUS AUREUS BCID: NOT DETECTED
STAPHYLOCOCCUS SPECIES: NOT DETECTED
STREPTOCOCCUS AGALACTIAE: NOT DETECTED
STREPTOCOCCUS SPECIES: NOT DETECTED
Serratia marcescens: NOT DETECTED
Streptococcus pneumoniae: NOT DETECTED
Streptococcus pyogenes: NOT DETECTED
VANCOMYCIN RESISTANCE: NOT DETECTED

## 2015-07-15 LAB — GLUCOSE, CAPILLARY
GLUCOSE-CAPILLARY: 95 mg/dL (ref 65–99)
GLUCOSE-CAPILLARY: 106 mg/dL — AB (ref 65–99)
Glucose-Capillary: 173 mg/dL — ABNORMAL HIGH (ref 65–99)
Glucose-Capillary: 68 mg/dL (ref 65–99)
Glucose-Capillary: 72 mg/dL (ref 65–99)
Glucose-Capillary: 89 mg/dL (ref 65–99)
Glucose-Capillary: 91 mg/dL (ref 65–99)

## 2015-07-15 LAB — PROTIME-INR
INR: 1.16
Prothrombin Time: 15 seconds (ref 11.4–15.0)

## 2015-07-15 LAB — URINE CULTURE: CULTURE: NO GROWTH

## 2015-07-15 LAB — CBC
HCT: 35.6 % — ABNORMAL LOW (ref 40.0–52.0)
HEMOGLOBIN: 12.4 g/dL — AB (ref 13.0–18.0)
MCH: 30.8 pg (ref 26.0–34.0)
MCHC: 34.9 g/dL (ref 32.0–36.0)
MCV: 88.2 fL (ref 80.0–100.0)
PLATELETS: 204 10*3/uL (ref 150–440)
RBC: 4.04 MIL/uL — AB (ref 4.40–5.90)
RDW: 14.1 % (ref 11.5–14.5)
WBC: 15.9 10*3/uL — ABNORMAL HIGH (ref 3.8–10.6)

## 2015-07-15 MED ORDER — CETYLPYRIDINIUM CHLORIDE 0.05 % MT LIQD
7.0000 mL | Freq: Two times a day (BID) | OROMUCOSAL | Status: DC
  Administered 2015-07-15 – 2015-07-19 (×7): 7 mL via OROMUCOSAL

## 2015-07-15 MED ORDER — DEXTROSE-NACL 5-0.9 % IV SOLN
INTRAVENOUS | Status: DC
  Administered 2015-07-15 – 2015-07-17 (×5): via INTRAVENOUS

## 2015-07-15 MED ORDER — INDOMETHACIN 50 MG RE SUPP
100.0000 mg | Freq: Once | RECTAL | Status: AC
  Administered 2015-07-16: 100 mg via RECTAL
  Filled 2015-07-15 (×2): qty 2

## 2015-07-15 MED ORDER — INDOMETHACIN 50 MG RE SUPP: 100.0000 mg | Freq: Once | RECTAL | Status: DC

## 2015-07-15 MED ORDER — CHLORHEXIDINE GLUCONATE 0.12 % MT SOLN
15.0000 mL | Freq: Two times a day (BID) | OROMUCOSAL | Status: DC
  Administered 2015-07-15 – 2015-07-20 (×10): 15 mL via OROMUCOSAL
  Filled 2015-07-15 (×8): qty 15

## 2015-07-15 NOTE — Consult Note (Signed)
[facility]  [address] [place], [place] [address] Phone: [phone] Fax : [phone]  Consultation  Referring Provider:     No ref. provider found Primary Care Physician:  Leonel Ramsay, MD Primary Gastroenterologist:           Reason for Consultation:     Possible cholangitis with abnormal liver enzymes  Date of Admission:  07/14/2015 Date of Consultation:  07/15/2015         HPI:   Joseph Flowers is a 82 y.o. male who was admitted with confusion and abdominal pain. The patient states he has had this pain in the past but it resolved spontaneously. The patient now was admitted and had a CT scan will suggestive of cholangitis. The patient also had elevated liver enzymes. The patient has a history of non-Hodgkin's lymphoma and coronary artery disease. The patient also has a history of diabetes hypertension hyperlipidemia and CHF. He reports that he is feeling much better today and is having much less pain. The patient also had an elevated white cell count of 11.7 on admission which has gone up to 15.9 today. His lactic acid was elevated on admission at 4.1 but is down to 2.5 today. There is no report of any nausea vomiting or unexplained weight loss.  Past Medical History  Diagnosis Date  . Diabetes mellitus without complication ([place])   . Hypertension   . Hyperlipemia   . 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 CHF (congestive heart failure) ([place]) 2001    Past Surgical History  Procedure Laterality Date  . Coronary artery bypass graft      Prior to Admission medications   Medication Sig Start Date End Date Taking? Authorizing Provider  aspirin 325 MG tablet Take 162.5 mg by mouth 2 (two) times daily.    Yes Historical Provider, MD  finasteride (PROSCAR) 5 MG tablet Take 5 mg by mouth daily.  06/12/14  Yes Historical Provider, MD  fluorouracil (EFUDEX) 5 % cream Apply 1 application topically 2 (two) times daily. Apply on right cheek for 3 to 4  weeks. 06/27/15 06/26/16 Yes Historical Provider, MD  furosemide (LASIX) 20 MG tablet Take 20 mg by mouth daily.  08/11/14  Yes Historical Provider, MD  glimepiride (AMARYL) 4 MG tablet Take 4 mg by mouth daily with breakfast. 06/04/15  Yes Historical Provider, MD  metFORMIN (GLUCOPHAGE) 500 MG tablet Take 500 mg by mouth daily with breakfast.  08/11/14  Yes Historical Provider, MD  metoprolol succinate (TOPROL-XL) 50 MG 24 hr tablet Take 25 mg by mouth 2 (two) times daily.  08/11/14  Yes Historical Provider, MD  ramipril (ALTACE) 10 MG capsule Take 10 mg by mouth daily.  08/11/14  Yes Historical Provider, MD  simvastatin (ZOCOR) 20 MG tablet Take 20 mg by mouth at bedtime.  08/11/14  Yes Historical Provider, MD    Family History  Problem Relation Age of Onset  . Cancer Mother   . Cancer Father      Social History  Substance Use Topics  . Smoking status: Never Smoker   . Smokeless tobacco: Never Used  . Alcohol Use: No    Allergies as of 07/14/2015  . (No Known Allergies)    Review of Systems:    All systems reviewed and negative except where noted in HPI.   Physical Exam:  Vital signs in last 24 hours: Temp:  [97.8 F (36.6 C)-98 F (36.7 C)] 98 F (36.7 C) (  05/21 [id number]) Pulse Rate:  [75-125] 79 (05/21 0810) Resp:  [18-24] 20 (05/21 0810) BP: (113-166)/(51-106) 116/51 mmHg (05/21 0810) SpO2:  [97 %-99 %] 98 % (05/21 0810) Weight:  [203 lb 4.8 oz (92.216 kg)-205 lb 2 oz (93.044 kg)] 205 lb 2 oz (93.044 kg) (05/21 0611) Last BM Date: 07/14/15 General:   Pleasant, cooperative in NAD Head:  Normocephalic and atraumatic. Eyes:   No icterus.   Conjunctiva pink. PERRLA. Ears:  Normal auditory acuity. Neck:  Supple; no masses or thyroidomegaly Lungs: Respirations even and unlabored. Lungs clear to auscultation bilaterally.   No wheezes, crackles, or rhonchi.  Heart:  Regular rate and rhythm;  Without murmur, clicks, rubs or gallops Abdomen:  Soft, nondistended, nontender. Normal bowel  sounds. No appreciable masses or hepatomegaly.  No rebound or guarding.  Rectal:  Not performed. Msk:  Symmetrical without gross deformities.    Extremities:  Without edema, cyanosis or clubbing. Neurologic:  Alert and oriented x3;  grossly normal neurologically. Skin:  Intact without significant lesions or rashes. Cervical Nodes:  No significant cervical adenopathy. Psych:  Alert and cooperative. Normal affect.  LAB RESULTS:  Recent Labs  07/14/15 1039 07/15/15 0544  WBC 11.7* 15.9*  HGB 13.2 12.4*  HCT 39.1* 35.6*  PLT 185 204   BMET  Recent Labs  07/14/15 1039 07/15/15 0544  NA 134* 137  K 4.5 4.4  CL 100* 111  CO2 21* 21*  GLUCOSE 351* 73  BUN 10 17  CREATININE 1.03 0.96  CALCIUM 9.1 8.0*   LFT  Recent Labs  07/15/15 0544  PROT 5.2*  ALBUMIN 2.7*  AST 223*  ALT 269*  ALKPHOS 101  BILITOT 3.4*   PT/INR  Recent Labs  07/15/15 0544  LABPROT 15.0  INR 1.16    STUDIES: Ct Abdomen Pelvis W Contrast  07/14/2015  CLINICAL DATA:  C/o abd pain and cramping x 2 days. Pt reports nausea and diarrhea yesterday. Denies vomiting. 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 EXAM: CT ABDOMEN AND PELVIS WITH CONTRAST TECHNIQUE: Multidetector CT imaging of the abdomen and pelvis was performed using the standard protocol following bolus administration of intravenous contrast. CONTRAST:  168mL ISOVUE-300 IOPAMIDOL (ISOVUE-300) INJECTION 61% COMPARISON:  08/18/2014 FINDINGS: Lung bases: Minor subsegmental atelectasis and/ or scarring. Heart normal size. Few mildly prominent, but nonenlarged, lymph nodes adjacent to the inferior descending thoracic aorta similar to the prior exam. Hepatobiliary: Fatty infiltration of the liver. No liver mass or focal lesion. Small dependent gallstones. No gallbladder wall thickening. There is enhancement along the wall of the common bile duct. Mild hazy opacity is noted in the fat along the porta hepatis. These findings are new from the prior exam.  There is no bile duct dilation. Spleen, pancreas, adrenal glands:  Unremarkable. Kidneys, ureters, bladder: 5 mm low-density lesion in the upper pole the right kidney consistent with a cyst, stable. No other renal masses or lesions, no stones and no hydronephrosis. Normal ureters. Bladder is unremarkable. Lymph nodes:  No pathologically enlarged lymph nodes. Ascites:  None. Gastrointestinal: Scattered left colon diverticula. No diverticulitis. Stomach is unremarkable. Normal small bowel. There is some hazy opacity at the root of the small bowel mesenteries similar to the prior exam. Normal appendix is visualized. Vascular: There are scattered aortic atherosclerotic calcifications with more dense calcification along splenic artery. No aneurysm. Musculoskeletal:  No osteoblastic or osteolytic lesions. IMPRESSION: 1. Mild enhancement of the wall of the common bile duct with mild adjacent hazy inflammatory type change in the  porta hepatis fat. Consider cholangitis in the proper clinical setting. There is no evidence of acute cholecystitis. Small gallstone is evident the gallbladder fundus. 2. There is some focal opacity at the root of the small bowel mesentery, which is similar to prior exam 3. Hepatic steatosis. 4. Left colon diverticula.  No diverticulitis. Electronically Signed   By: Lajean Manes M.D.   On: 07/14/2015 14:12      Impression / Plan:   Joseph Flowers is a 82 y.o. y/o male with came in with confusion who is now alert and oriented 3 and was found to have abnormal liver enzymes and possible cholangitis. The patient has been feeling better on antibiotics but still has an increased white cell count despite his confusion resolving. The patient will be set up for an ERCP. The patient has been explained the risks including pancreatitis and death. The patient understands the plan and agrees with it.  Thank you for involving me in the care of this patient.      LOS: 1 day   Lucilla Lame, MD   07/15/2015, 12:41 PM   Note: This dictation was prepared with Dragon dictation along with smaller phrase technology. Any transcriptional errors that result from this process are unintentional.

## 2015-07-15 NOTE — Progress Notes (Signed)
[place] at [facility]                                                                                                                                                                                            Patient Demographics   Joseph Flowers, is a 82 y.o. male, DOB - 1927/07/30, ID:[id number]  Admit date - 07/14/2015   Admitting Physician Hillary Bow, MD  Outpatient Primary MD for the patient is FITZGERALD, DAVID [name], MD   LOS - 1  Subjective:Patient continues to have pain in his abdomen. No nausea vomiting or diarrhea     Review of Systems:   CONSTITUTIONAL: No documented fever. No fatigue, weakness. No weight gain, no weight loss.  EYES: No blurry or double vision.  ENT: No tinnitus. No postnasal drip. No redness of the oropharynx.  RESPIRATORY: No cough, no wheeze, no hemoptysis. No dyspnea.  CARDIOVASCULAR: No chest pain. No orthopnea. No palpitations. No syncope.  GASTROINTESTINAL: No nausea, no vomiting or diarrhea. Positive abdominal pain. No melena or hematochezia.  GENITOURINARY: No dysuria or hematuria.  ENDOCRINE: No polyuria or nocturia. No heat or cold intolerance.  HEMATOLOGY: No anemia. No bruising. No bleeding.  INTEGUMENTARY: No rashes. No lesions.  MUSCULOSKELETAL: No arthritis. No swelling. No gout.  NEUROLOGIC: No numbness, tingling, or ataxia. No seizure-type activity.  PSYCHIATRIC: No anxiety. No insomnia. No ADD.    Vitals:   Filed Vitals:   07/14/15 2135 07/15/15 0611 07/15/15 0633 07/15/15 0810  BP: 114/61  113/52 116/51  Pulse: 77  75 79  Temp:   98 F (36.7 C)   TempSrc:   Oral   Resp: 20   20  Height:      Weight:  93.044 kg (205 lb 2 oz)    SpO2: 98%  99% 98%    Wt Readings from Last 3 Encounters:  07/15/15 93.044 kg (205 lb 2 oz)  10/06/14 91.6 kg (201 lb 15.1 oz)  08/30/14 92.987 kg (205 lb)     Intake/Output Summary (Last 24 hours) at 07/15/15 1401 Last data filed at 07/15/15 [id number]   Gross per 24 hour  Intake 2088.5 ml  Output    301 ml  Net 1787.5 ml    Physical Exam:   GENERAL: Pleasant-appearing in no apparent distress.  HEAD, EYES, EARS, NOSE AND THROAT: Atraumatic, normocephalic. Extraocular muscles are intact. Pupils equal and reactive to light. Sclerae anicteric. No conjunctival injection. No oro-pharyngeal erythema.  NECK: Supple. There is no jugular venous distention. No bruits, no lymphadenopathy, no thyromegaly.  HEART: Regular rate and rhythm,. No murmurs, no rubs, no clicks.  LUNGS: Clear to auscultation bilaterally. No rales or rhonchi. No wheezes.  ABDOMEN: Soft, flat,  tenderness on the right upper quadrant nondistended. Has good bowel sounds. No hepatosplenomegaly appreciated.  EXTREMITIES: No evidence of any cyanosis, clubbing, or peripheral edema.  +2 pedal and radial pulses bilaterally.  NEUROLOGIC: The patient is alert, awake, and oriented x3 with no focal motor or sensory deficits appreciated bilaterally.  SKIN: Moist and warm with no rashes appreciated.  Psych: Not anxious, depressed LN: No inguinal LN enlargement    Antibiotics   Anti-infectives    Start     Dose/Rate Route Frequency Ordered Stop   07/14/15 2200  piperacillin-tazobactam (ZOSYN) IVPB 3.375 g     3.375 g 12.5 mL/hr over 240 Minutes Intravenous Every 8 hours 07/14/15 1447     07/14/15 1430  piperacillin-tazobactam (ZOSYN) IVPB 3.375 g     3.375 g 100 mL/hr over 30 Minutes Intravenous  Once 07/14/15 1417 07/14/15 1517      Medications   Scheduled Meds: . enoxaparin (LOVENOX) injection  40 mg Subcutaneous Q24H  . finasteride  5 mg Oral Daily  . insulin aspart  0-15 Units Subcutaneous Q4H  . metoprolol succinate  25 mg Oral BID  .  morphine injection  4 mg Intravenous Once  . ondansetron (ZOFRAN) IV  4 mg Intravenous Once  . piperacillin-tazobactam (ZOSYN)  IV  3.375 g Intravenous Q8H  . ramipril  10 mg Oral Daily   Continuous Infusions: . dextrose 5 % and 0.9%  NaCl 100 mL/hr at 07/15/15 0919   PRN Meds:.acetaminophen **OR** acetaminophen, albuterol, bisacodyl, HYDROcodone-acetaminophen, metoprolol, morphine injection, ondansetron **OR** ondansetron (ZOFRAN) IV, polyethylene glycol   Data Review:   Micro Results Recent Results (from the past 240 hour(s))  Blood Culture (routine x 2)     Status: None (Preliminary result)   Collection Time: 07/14/15 12:19 PM  Result Value Ref Range Status   Specimen Description BLOOD RIGHT ARM  Final   Special Requests BOTTLES DRAWN AEROBIC AND ANAEROBIC 1 CC  Final   Culture  Setup Time   Final    GRAM VARIABLE ROD ANAEROBIC BOTTLE ONLY CRITICAL RESULT CALLED TO, READ BACK BY AND VERIFIED WITH: PREVIOUSLY CALLED TO MATT MCBANE AND ROBYN THOMAS AT 0122 ON 07/15/15 [facility] CONFIRMED BY [facility]    Culture GRAM VARIABLE ROD ANAEROBIC BOTTLE ONLY   Final   Report Status PENDING  Incomplete  Blood Culture (routine x 2)     Status: None (Preliminary result)   Collection Time: 07/14/15 12:19 PM  Result Value Ref Range Status   Specimen Description BLOOD LEFT FOREARM  Final   Special Requests BOTTLES DRAWN AEROBIC AND ANAEROBIC 1 CC  Final   Culture  Setup Time   Final    GRAM VARIABLE ROD ANAEROBIC BOTTLE ONLY CRITICAL RESULT CALLED TO, READ BACK BY AND VERIFIED WITH: MATT MCBANE AND ROBYN THOMAS AT 0122 ON 07/15/15 [facility] Organism ID to follow CONFIRMED BY [facility]    Culture   Final    GRAM VARIABLE ROD ANAEROBIC BOTTLE ONLY IDENTIFICATION TO FOLLOW    Report Status PENDING  Incomplete  Blood Culture ID Panel (Reflexed)     Status: None   Collection Time: 07/14/15 12:19 PM  Result Value Ref Range Status   Enterococcus species NOT DETECTED NOT DETECTED Final   Vancomycin resistance NOT DETECTED NOT DETECTED Final   Listeria monocytogenes NOT DETECTED NOT DETECTED Final   Staphylococcus species NOT DETECTED NOT DETECTED Final   Staphylococcus aureus NOT DETECTED NOT  DETECTED Final   Methicillin resistance NOT  DETECTED NOT DETECTED Final   Streptococcus species NOT DETECTED NOT DETECTED Final   Streptococcus agalactiae NOT DETECTED NOT DETECTED Final   Streptococcus pneumoniae NOT DETECTED NOT DETECTED Final   Streptococcus pyogenes NOT DETECTED NOT DETECTED Final   Acinetobacter baumannii NOT DETECTED NOT DETECTED Final   Enterobacteriaceae species NOT DETECTED NOT DETECTED Final   Enterobacter cloacae complex NOT DETECTED NOT DETECTED Final   Escherichia coli NOT DETECTED NOT DETECTED Final   Klebsiella oxytoca NOT DETECTED NOT DETECTED Final   Klebsiella pneumoniae NOT DETECTED NOT DETECTED Final   Proteus species NOT DETECTED NOT DETECTED Final   Serratia marcescens NOT DETECTED NOT DETECTED Final   Carbapenem resistance NOT DETECTED NOT DETECTED Final   Haemophilus influenzae NOT DETECTED NOT DETECTED Final   Neisseria meningitidis NOT DETECTED NOT DETECTED Final   Pseudomonas aeruginosa NOT DETECTED NOT DETECTED Final   Candida albicans NOT DETECTED NOT DETECTED Final   Candida glabrata NOT DETECTED NOT DETECTED Final   Candida krusei NOT DETECTED NOT DETECTED Final   Candida parapsilosis NOT DETECTED NOT DETECTED Final   Candida tropicalis NOT DETECTED NOT DETECTED Final    Radiology Reports Ct Abdomen Pelvis W Contrast  07/14/2015  CLINICAL DATA:  C/o abd pain and cramping x 2 days. Pt reports nausea and diarrhea yesterday. Denies vomiting. 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 EXAM: CT ABDOMEN AND PELVIS WITH CONTRAST TECHNIQUE: Multidetector CT imaging of the abdomen and pelvis was performed using the standard protocol following bolus administration of intravenous contrast. CONTRAST:  131mL ISOVUE-300 IOPAMIDOL (ISOVUE-300) INJECTION 61% COMPARISON:  08/18/2014 FINDINGS: Lung bases: Minor subsegmental atelectasis and/ or scarring. Heart normal size. Few mildly prominent, but nonenlarged, lymph nodes adjacent to the inferior descending thoracic aorta similar to the prior exam.  Hepatobiliary: Fatty infiltration of the liver. No liver mass or focal lesion. Small dependent gallstones. No gallbladder wall thickening. There is enhancement along the wall of the common bile duct. Mild hazy opacity is noted in the fat along the porta hepatis. These findings are new from the prior exam. There is no bile duct dilation. Spleen, pancreas, adrenal glands:  Unremarkable. Kidneys, ureters, bladder: 5 mm low-density lesion in the upper pole the right kidney consistent with a cyst, stable. No other renal masses or lesions, no stones and no hydronephrosis. Normal ureters. Bladder is unremarkable. Lymph nodes:  No pathologically enlarged lymph nodes. Ascites:  None. Gastrointestinal: Scattered left colon diverticula. No diverticulitis. Stomach is unremarkable. Normal small bowel. There is some hazy opacity at the root of the small bowel mesenteries similar to the prior exam. Normal appendix is visualized. Vascular: There are scattered aortic atherosclerotic calcifications with more dense calcification along splenic artery. No aneurysm. Musculoskeletal:  No osteoblastic or osteolytic lesions. IMPRESSION: 1. Mild enhancement of the wall of the common bile duct with mild adjacent hazy inflammatory type change in the porta hepatis fat. Consider cholangitis in the proper clinical setting. There is no evidence of acute cholecystitis. Small gallstone is evident the gallbladder fundus. 2. There is some focal opacity at the root of the small bowel mesentery, which is similar to prior exam 3. Hepatic steatosis. 4. Left colon diverticula.  No diverticulitis. Electronically Signed   By: Lajean Manes M.D.   On: 07/14/2015 14:12     CBC  Recent Labs Lab 07/14/15 1039 07/15/15 0544  WBC 11.7* 15.9*  HGB 13.2 12.4*  HCT 39.1* 35.6*  PLT 185 204  MCV 87.4 88.2  MCH 29.6 30.8  MCHC 33.8 34.9  RDW 13.1 14.1  LYMPHSABS 0.2*  --   MONOABS 0.4  --   EOSABS 0.0  --   BASOSABS 0.0  --     Chemistries    Recent Labs Lab 07/14/15 1039 07/15/15 0544  NA 134* 137  K 4.5 4.4  CL 100* 111  CO2 21* 21*  GLUCOSE 351* 73  BUN 10 17  CREATININE 1.03 0.96  CALCIUM 9.1 8.0*  AST 545* 223*  ALT 466* 269*  ALKPHOS 133* 101  BILITOT 4.4* 3.4*   ------------------------------------------------------------------------------------------------------------------ estimated creatinine clearance is 64.5 mL/min (by C-G formula based on Cr of 0.96). ------------------------------------------------------------------------------------------------------------------ No results for input(s): HGBA1C in the last 72 hours. ------------------------------------------------------------------------------------------------------------------ No results for input(s): CHOL, HDL, LDLCALC, TRIG, CHOLHDL, LDLDIRECT in the last 72 hours. ------------------------------------------------------------------------------------------------------------------ No results for input(s): TSH, T4TOTAL, T3FREE, THYROIDAB in the last 72 hours.  Invalid input(s): FREET3 ------------------------------------------------------------------------------------------------------------------ No results for input(s): VITAMINB12, FOLATE, FERRITIN, TIBC, IRON, RETICCTPCT in the last 72 hours.  Coagulation profile  Recent Labs Lab 07/15/15 0544  INR 1.16    No results for input(s): DDIMER in the last 72 hours.  Cardiac Enzymes  Recent Labs Lab 07/14/15 1039 07/14/15 1304  TROPONINI 0.03 0.04*   ------------------------------------------------------------------------------------------------------------------ Invalid input(s): POCBNP    Assessment & Plan   * Acute cholangitis with sepsis Continue IV Zosyn Blood culture showing gram-negative rods Seen by GI plan for ERCP tomorrow   * Diabetes mellitus with hypoglycemia  Patient will be nothing by mouth for ERCP. continue sliding scale insulin I have changed his IV fluids with  D5  *Elevated troponin due to demand ischemia no chest pain   * Hypertension Continue patient's metoprolol and ramipril from home. Continue Lopressor IV when necessary.  * Chronic congestive heart failure. Unknown diastolic or systolic. Currently compensated monitor fluid status  * DVT prophylaxis with Lovenox.     Code Status Orders        Start     Ordered   07/14/15 1437  Full code   Continuous     07/14/15 1437    Code Status History    Date Active Date Inactive Code Status Order ID Comments User Context   08/18/2014  6:45 PM 08/23/2014  7:13 PM Full Code [id number]  Marlyce Huge, MD ED    Advance Directive Documentation        Most Recent Value   Type of Advance Directive  Healthcare Power of Attorney   Pre-existing out of facility DNR order (yellow form or pink MOST form)     "MOST" Form in Place?             ConsultsGI surgery DVT Prophylaxis  Lovenox   Lab Results  Component Value Date   PLT 204 07/15/2015     Time Spent in minutes   56min  Greater than 50% of time spent in care coordination and counseling patient regarding the condition and plan of care.   Dustin Flock M.D on 07/15/2015 at 2:01 PM  Between 7am to 6pm - Pager - [phone]  After 6pm go to [email or web address] - password [email or web address] [facility]   Office  [phone]

## 2015-07-15 NOTE — Progress Notes (Signed)
82yr old male with acute cholangitis and multiple medical issues, including hx of lymphoma.  Patient states pain improved today.  He is able to answer questions and oriented to person, place and knows why he is here.  He is passing flatus. He denies any nausea, fever or chills.   Filed Vitals:   07/15/15 0810 07/15/15 1504  BP: 116/51 120/60  Pulse: 79 80  Temp:  97.7 F (36.5 C)  Resp: 20 18   I/O last 3 completed shifts: In: 2088.5 [I.V.:2048; IV Piggyback:40.5] Out: 301 [Urine:300; Stool:1] Total I/O In: -  Out: 200 [Urine:200]   PE:  GeN: NAD, AAOx3 Res: CTAB/L CardIO: RRR Abd: soft, tender in epigastrium  Ext: no edema  CBC Latest Ref Rng 07/15/2015 07/14/2015 10/06/2014  WBC 3.8 - 10.6 K/uL 15.9(H) 11.7(H) 7.7  Hemoglobin 13.0 - 18.0 g/dL 12.4(L) 13.2 14.2  Hematocrit 40.0 - 52.0 % 35.6(L) 39.1(L) 41.6  Platelets 150 - 440 K/uL 204 185 235    CMP Latest Ref Rng 07/15/2015 07/14/2015 10/06/2014  Glucose 65 - 99 mg/dL 73 351(H) -  BUN 6 - 20 mg/dL 17 10 -  Creatinine 0.61 - 1.24 mg/dL 0.96 1.03 1.03  Sodium 135 - 145 mmol/L 137 134(L) -  Potassium 3.5 - 5.1 mmol/L 4.4 4.5 -  Chloride 101 - 111 mmol/L 111 100(L) -  CO2 22 - 32 mmol/L 21(L) 21(L) -  Calcium 8.9 - 10.3 mg/dL 8.0(L) 9.1 -  Total Protein 6.5 - 8.1 g/dL 5.2(L) 7.3 7.0  Total Bilirubin 0.3 - 1.2 mg/dL 3.4(H) 4.4(H) 0.5  Alkaline Phos 38 - 126 U/L 101 133(H) 48  AST 15 - 41 U/L 223(H) 545(H) 28  ALT 17 - 63 U/L 269(H) 466(H) 22    A/P:  82 yr old male with acute cholangitis, WBC increasing but clinically confusion and pain improved, to have ERCP with sphincterotomy in AM, given commodities, this may be the only procedure needed if he improves.  Surgery will continue to follow.

## 2015-07-15 NOTE — Plan of Care (Signed)
Problem: Physical Regulation: Goal: Ability to maintain clinical measurements within normal limits will improve Outcome: Progressing In am CBG 68. At recheck CBG 72. MD notified, D5%NaCl0.9% IVF initiated. No c/o n/v. Pt reports mild abdominal tenderness, declines intervention. Scheduled for ERCP tomorrow. Consent signed.

## 2015-07-16 ENCOUNTER — Inpatient Hospital Stay: Payer: Medicare Other | Admitting: Anesthesiology

## 2015-07-16 ENCOUNTER — Encounter
Admission: EM | Disposition: A | Discharge status: Home Health | Payer: Self-pay | Source: Home / Self Care | Attending: Internal Medicine

## 2015-07-16 DIAGNOSIS — K209 Esophagitis, unspecified without bleeding: Secondary | ICD-10-CM | POA: Diagnosis present

## 2015-07-16 DIAGNOSIS — K269 Duodenal ulcer, unspecified as acute or chronic, without hemorrhage or perforation: Secondary | ICD-10-CM | POA: Diagnosis present

## 2015-07-16 DIAGNOSIS — R17 Unspecified jaundice: Secondary | ICD-10-CM

## 2015-07-16 DIAGNOSIS — K311 Adult hypertrophic pyloric stenosis: Secondary | ICD-10-CM

## 2015-07-16 HISTORY — PX: ENDOSCOPIC RETROGRADE CHOLANGIOPANCREATOGRAPHY (ERCP) WITH PROPOFOL: SHX5810

## 2015-07-16 LAB — GLUCOSE, CAPILLARY
GLUCOSE-CAPILLARY: 154 mg/dL — AB (ref 65–99)
GLUCOSE-CAPILLARY: 100 mg/dL — AB (ref 65–99)
GLUCOSE-CAPILLARY: 170 mg/dL — AB (ref 65–99)
Glucose-Capillary: 119 mg/dL — ABNORMAL HIGH (ref 65–99)
Glucose-Capillary: 132 mg/dL — ABNORMAL HIGH (ref 65–99)
Glucose-Capillary: 152 mg/dL — ABNORMAL HIGH (ref 65–99)

## 2015-07-16 SURGERY — ENDOSCOPIC RETROGRADE CHOLANGIOPANCREATOGRAPHY (ERCP) WITH PROPOFOL
Anesthesia: General

## 2015-07-16 MED ORDER — SUCCINYLCHOLINE CHLORIDE 20 MG/ML IJ SOLN
INTRAMUSCULAR | Status: DC | PRN
  Administered 2015-07-16: 80 mg via INTRAVENOUS

## 2015-07-16 MED ORDER — FENTANYL CITRATE (PF) 100 MCG/2ML IJ SOLN: 25.0000 ug | INTRAMUSCULAR | Status: DC | PRN

## 2015-07-16 MED ORDER — RAMIPRIL 10 MG PO CAPS
10.0000 mg | ORAL_CAPSULE | Freq: Every day | ORAL | Status: DC
  Administered 2015-07-16: 20:00:00 10 mg via ORAL
  Filled 2015-07-16: qty 1

## 2015-07-16 MED ORDER — SUGAMMADEX SODIUM 200 MG/2ML IV SOLN
INTRAVENOUS | Status: DC | PRN
  Administered 2015-07-16: 200 mg via INTRAVENOUS

## 2015-07-16 MED ORDER — ROCURONIUM BROMIDE 100 MG/10ML IV SOLN
INTRAVENOUS | Status: DC | PRN
  Administered 2015-07-16: 25 mg via INTRAVENOUS
  Administered 2015-07-16: 5 mg via INTRAVENOUS
  Administered 2015-07-16: 10 mg via INTRAVENOUS

## 2015-07-16 MED ORDER — FLUOROURACIL 5 % EX CREA
1.0000 "application " | TOPICAL_CREAM | Freq: Two times a day (BID) | CUTANEOUS | Status: DC
  Administered 2015-07-16 – 2015-07-17 (×3): via TOPICAL
  Administered 2015-07-17 – 2015-07-20 (×6): 1 via TOPICAL
  Filled 2015-07-16: qty 40

## 2015-07-16 MED ORDER — LIDOCAINE HCL (CARDIAC) 20 MG/ML IV SOLN
INTRAVENOUS | Status: DC | PRN
  Administered 2015-07-16: 100 mg via INTRAVENOUS

## 2015-07-16 MED ORDER — SODIUM CHLORIDE 0.9 % IV SOLN
INTRAVENOUS | Status: DC | PRN
  Administered 2015-07-16: 16:00:00 via INTRAVENOUS

## 2015-07-16 MED ORDER — PROPOFOL 10 MG/ML IV BOLUS
INTRAVENOUS | Status: DC | PRN
  Administered 2015-07-16: 200 mg via INTRAVENOUS

## 2015-07-16 MED ORDER — PANTOPRAZOLE SODIUM 40 MG PO TBEC
40.0000 mg | DELAYED_RELEASE_TABLET | Freq: Two times a day (BID) | ORAL | Status: DC
  Administered 2015-07-17 – 2015-07-20 (×7): 40 mg via ORAL
  Filled 2015-07-16 (×7): qty 1

## 2015-07-16 MED ORDER — FENTANYL CITRATE (PF) 100 MCG/2ML IJ SOLN
INTRAMUSCULAR | Status: DC | PRN
  Administered 2015-07-16 (×2): 25 ug via INTRAVENOUS
  Administered 2015-07-16: 50 ug via INTRAVENOUS

## 2015-07-16 MED ORDER — ONDANSETRON HCL 4 MG/2ML IJ SOLN: 4.0000 mg | Freq: Once | INTRAMUSCULAR | Status: DC | PRN

## 2015-07-16 MED ORDER — ONDANSETRON HCL 4 MG/2ML IJ SOLN
INTRAMUSCULAR | Status: DC | PRN
  Administered 2015-07-16: 4 mg via INTRAVENOUS

## 2015-07-16 MED ORDER — METOPROLOL SUCCINATE ER 25 MG PO TB24: 25.0000 mg | ORAL_TABLET | Freq: Two times a day (BID) | ORAL | Status: DC

## 2015-07-16 MED ORDER — KETOTIFEN FUMARATE 0.025 % OP SOLN
1.0000 [drp] | Freq: Two times a day (BID) | OPHTHALMIC | Status: DC
  Administered 2015-07-16 – 2015-07-20 (×8): 1 [drp] via OPHTHALMIC
  Filled 2015-07-16: qty 5

## 2015-07-16 NOTE — OR Nursing (Signed)
Dr. Amie Critchley in to see patient and decided not to treat patient at this time.

## 2015-07-16 NOTE — Progress Notes (Signed)
[facility]                                                                                                                                                                                            Patient Demographics   Joseph Flowers, is a 82 y.o. male, DOB - 1928/02/16, ID:[id number]  Admit date - 07/14/2015   Admitting Physician Hillary Bow, MD  Outpatient Primary MD for the patient is FITZGERALD, DAVID [name], MD   LOS - 2  Subjective:Patient continues to have pain in his abdomen. No nausea vomiting or diarrhea     Review of Systems:   CONSTITUTIONAL: No documented fever. No fatigue, weakness. No weight gain, no weight loss.  EYES: No blurry or double vision.  ENT: No tinnitus. No postnasal drip. No redness of the oropharynx.  RESPIRATORY: No cough, no wheeze, no hemoptysis. No dyspnea.  CARDIOVASCULAR: No chest pain. No orthopnea. No palpitations. No syncope.  GASTROINTESTINAL: No nausea, no vomiting or diarrhea. Positive abdominal pain. No melena or hematochezia.  GENITOURINARY: No dysuria or hematuria.  ENDOCRINE: No polyuria or nocturia. No heat or cold intolerance.  HEMATOLOGY: No anemia. No bruising. No bleeding.  INTEGUMENTARY: No rashes. No lesions.  MUSCULOSKELETAL: No arthritis. No swelling. No gout.  NEUROLOGIC: No numbness, tingling, or ataxia. No seizure-type activity.  PSYCHIATRIC: No anxiety. No insomnia. No ADD.    Vitals:   Filed Vitals:   07/15/15 1504 07/15/15 2052 07/16/15 0450 07/16/15 0450  BP: 120/60 129/68  134/61  Pulse: 80 76  73  Temp: 97.7 F (36.5 C) 97.9 F (36.6 C)  97.6 F (36.4 C)  TempSrc: Oral Oral  Oral  Resp: 18 16  18   Height:      Weight:   93.639 kg (206 lb 7 oz)   SpO2: 99% 99%  96%    Wt Readings from Last 3 Encounters:  07/16/15 93.639 kg (206 lb 7 oz)  10/06/14 91.6 kg (201 lb 15.1 oz)  08/30/14 92.987 kg (205 lb)     Intake/Output Summary (Last 24 hours) at 07/16/15  1339 Last data filed at 07/16/15 1224  Gross per 24 hour  Intake   1353 ml  Output    600 ml  Net    753 ml    Physical Exam:   GENERAL: Pleasant-appearing in no apparent distress.  HEAD, EYES, EARS, NOSE AND THROAT: Atraumatic, normocephalic. Extraocular muscles are intact. Pupils equal and reactive to light. Sclerae anicteric. No conjunctival injection. No oro-pharyngeal erythema.  NECK: Supple. There is no jugular venous distention. No bruits, no lymphadenopathy, no thyromegaly.  HEART:  Regular rate and rhythm,. No murmurs, no rubs, no clicks.  LUNGS: Clear to auscultation bilaterally. No rales or rhonchi. No wheezes.  ABDOMEN: Soft, flat,  tenderness on the right upper quadrant nondistended. Has good bowel sounds. No hepatosplenomegaly appreciated.  EXTREMITIES: No evidence of any cyanosis, clubbing, or peripheral edema.  +2 pedal and radial pulses bilaterally.  NEUROLOGIC: The patient is alert, awake, and oriented x3 with no focal motor or sensory deficits appreciated bilaterally.  SKIN: Moist and warm with no rashes appreciated.  Psych: Not anxious, depressed LN: No inguinal LN enlargement    Antibiotics   Anti-infectives    Start     Dose/Rate Route Frequency Ordered Stop   07/14/15 2200  piperacillin-tazobactam (ZOSYN) IVPB 3.375 g     3.375 g 12.5 mL/hr over 240 Minutes Intravenous Every 8 hours 07/14/15 1447     07/14/15 1430  piperacillin-tazobactam (ZOSYN) IVPB 3.375 g     3.375 g 100 mL/hr over 30 Minutes Intravenous  Once 07/14/15 1417 07/14/15 1517      Medications   Scheduled Meds: . antiseptic oral rinse  7 mL Mouth Rinse q12n4p  . chlorhexidine  15 mL Mouth Rinse BID  . enoxaparin (LOVENOX) injection  40 mg Subcutaneous Q24H  . finasteride  5 mg Oral Daily  . indomethacin  100 mg Rectal Once  . insulin aspart  0-15 Units Subcutaneous Q4H  . ketotifen  1 drop Both Eyes BID  . metoprolol succinate  25 mg Oral BID  .  morphine injection  4 mg Intravenous  Once  . ondansetron (ZOFRAN) IV  4 mg Intravenous Once  . piperacillin-tazobactam (ZOSYN)  IV  3.375 g Intravenous Q8H  . ramipril  10 mg Oral Daily   Continuous Infusions: . dextrose 5 % and 0.9% NaCl 100 mL/hr at 07/16/15 0449   PRN Meds:.acetaminophen **OR** acetaminophen, albuterol, bisacodyl, HYDROcodone-acetaminophen, metoprolol, morphine injection, ondansetron **OR** ondansetron (ZOFRAN) IV, polyethylene glycol   Data Review:   Micro Results Recent Results (from the past 240 hour(s))  Urine culture     Status: None   Collection Time: 07/14/15 10:39 AM  Result Value Ref Range Status   Specimen Description URINE, RANDOM  Final   Special Requests NONE  Final   Culture NO GROWTH Performed at [facility]   Final   Report Status 07/15/2015 FINAL  Final  Blood Culture (routine x 2)     Status: None (Preliminary result)   Collection Time: 07/14/15 12:19 PM  Result Value Ref Range Status   Specimen Description BLOOD RIGHT ARM  Final   Special Requests BOTTLES DRAWN AEROBIC AND ANAEROBIC 1 CC  Final   Culture  Setup Time   Final    GRAM VARIABLE ROD ANAEROBIC BOTTLE ONLY CRITICAL RESULT CALLED TO, READ BACK BY AND VERIFIED WITH: PREVIOUSLY CALLED TO MATT MCBANE AND ROBYN THOMAS AT 0122 ON 07/15/15 [facility] CONFIRMED BY [facility]    Culture GRAM VARIABLE ROD ANAEROBIC BOTTLE ONLY   Final   Report Status PENDING  Incomplete  Blood Culture (routine x 2)     Status: Abnormal (Preliminary result)   Collection Time: 07/14/15 12:19 PM  Result Value Ref Range Status   Specimen Description BLOOD LEFT FOREARM  Final   Special Requests BOTTLES DRAWN AEROBIC AND ANAEROBIC 1 CC  Final   Culture  Setup Time   Final    GRAM VARIABLE ROD ANAEROBIC BOTTLE ONLY CRITICAL RESULT CALLED TO, READ BACK BY AND VERIFIED WITH: MATT [place] AT  0122 ON 07/15/15 [facility] CONFIRMED BY [facility]    Culture CLOSTRIDIUM PERFRINGENS ANAEROBIC BOTTLE ONLY  (A)  Final   Report Status PENDING   Incomplete  Blood Culture ID Panel (Reflexed)     Status: None   Collection Time: 07/14/15 12:19 PM  Result Value Ref Range Status   Enterococcus species NOT DETECTED NOT DETECTED Final   Vancomycin resistance NOT DETECTED NOT DETECTED Final   Listeria monocytogenes NOT DETECTED NOT DETECTED Final   Staphylococcus species NOT DETECTED NOT DETECTED Final   Staphylococcus aureus NOT DETECTED NOT DETECTED Final   Methicillin resistance NOT DETECTED NOT DETECTED Final   Streptococcus species NOT DETECTED NOT DETECTED Final   Streptococcus agalactiae NOT DETECTED NOT DETECTED Final   Streptococcus pneumoniae NOT DETECTED NOT DETECTED Final   Streptococcus pyogenes NOT DETECTED NOT DETECTED Final   Acinetobacter baumannii NOT DETECTED NOT DETECTED Final   Enterobacteriaceae species NOT DETECTED NOT DETECTED Final   Enterobacter cloacae complex NOT DETECTED NOT DETECTED Final   Escherichia coli NOT DETECTED NOT DETECTED Final   Klebsiella oxytoca NOT DETECTED NOT DETECTED Final   Klebsiella pneumoniae NOT DETECTED NOT DETECTED Final   Proteus species NOT DETECTED NOT DETECTED Final   Serratia marcescens NOT DETECTED NOT DETECTED Final   Carbapenem resistance NOT DETECTED NOT DETECTED Final   Haemophilus influenzae NOT DETECTED NOT DETECTED Final   Neisseria meningitidis NOT DETECTED NOT DETECTED Final   Pseudomonas aeruginosa NOT DETECTED NOT DETECTED Final   Candida albicans NOT DETECTED NOT DETECTED Final   Candida glabrata NOT DETECTED NOT DETECTED Final   Candida krusei NOT DETECTED NOT DETECTED Final   Candida parapsilosis NOT DETECTED NOT DETECTED Final   Candida tropicalis NOT DETECTED NOT DETECTED Final    Radiology Reports Ct Abdomen Pelvis W Contrast  07/14/2015  CLINICAL DATA:  C/o abd pain and cramping x 2 days. Pt reports nausea and diarrhea yesterday. Denies vomiting. 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 EXAM: CT ABDOMEN AND PELVIS WITH CONTRAST TECHNIQUE: Multidetector CT  imaging of the abdomen and pelvis was performed using the standard protocol following bolus administration of intravenous contrast. CONTRAST:  167mL ISOVUE-300 IOPAMIDOL (ISOVUE-300) INJECTION 61% COMPARISON:  08/18/2014 FINDINGS: Lung bases: Minor subsegmental atelectasis and/ or scarring. Heart normal size. Few mildly prominent, but nonenlarged, lymph nodes adjacent to the inferior descending thoracic aorta similar to the prior exam. Hepatobiliary: Fatty infiltration of the liver. No liver mass or focal lesion. Small dependent gallstones. No gallbladder wall thickening. There is enhancement along the wall of the common bile duct. Mild hazy opacity is noted in the fat along the porta hepatis. These findings are new from the prior exam. There is no bile duct dilation. Spleen, pancreas, adrenal glands:  Unremarkable. Kidneys, ureters, bladder: 5 mm low-density lesion in the upper pole the right kidney consistent with a cyst, stable. No other renal masses or lesions, no stones and no hydronephrosis. Normal ureters. Bladder is unremarkable. Lymph nodes:  No pathologically enlarged lymph nodes. Ascites:  None. Gastrointestinal: Scattered left colon diverticula. No diverticulitis. Stomach is unremarkable. Normal small bowel. There is some hazy opacity at the root of the small bowel mesenteries similar to the prior exam. Normal appendix is visualized. Vascular: There are scattered aortic atherosclerotic calcifications with more dense calcification along splenic artery. No aneurysm. Musculoskeletal:  No osteoblastic or osteolytic lesions. IMPRESSION: 1. Mild enhancement of the wall of the common bile duct with mild adjacent hazy inflammatory type change in the porta hepatis fat. Consider cholangitis in  the proper clinical setting. There is no evidence of acute cholecystitis. Small gallstone is evident the gallbladder fundus. 2. There is some focal opacity at the root of the small bowel mesentery, which is similar to prior  exam 3. Hepatic steatosis. 4. Left colon diverticula.  No diverticulitis. Electronically Signed   By: Lajean Manes M.D.   On: 07/14/2015 14:12     CBC  Recent Labs Lab 07/14/15 1039 07/15/15 0544  WBC 11.7* 15.9*  HGB 13.2 12.4*  HCT 39.1* 35.6*  PLT 185 204  MCV 87.4 88.2  MCH 29.6 30.8  MCHC 33.8 34.9  RDW 13.1 14.1  LYMPHSABS 0.2*  --   MONOABS 0.4  --   EOSABS 0.0  --   BASOSABS 0.0  --     Chemistries   Recent Labs Lab 07/14/15 1039 07/15/15 0544  NA 134* 137  K 4.5 4.4  CL 100* 111  CO2 21* 21*  GLUCOSE 351* 73  BUN 10 17  CREATININE 1.03 0.96  CALCIUM 9.1 8.0*  AST 545* 223*  ALT 466* 269*  ALKPHOS 133* 101  BILITOT 4.4* 3.4*   ------------------------------------------------------------------------------------------------------------------ estimated creatinine clearance is 64.6 mL/min (by C-G formula based on Cr of 0.96). ------------------------------------------------------------------------------------------------------------------ No results for input(s): HGBA1C in the last 72 hours. ------------------------------------------------------------------------------------------------------------------ No results for input(s): CHOL, HDL, LDLCALC, TRIG, CHOLHDL, LDLDIRECT in the last 72 hours. ------------------------------------------------------------------------------------------------------------------ No results for input(s): TSH, T4TOTAL, T3FREE, THYROIDAB in the last 72 hours.  Invalid input(s): FREET3 ------------------------------------------------------------------------------------------------------------------ No results for input(s): VITAMINB12, FOLATE, FERRITIN, TIBC, IRON, RETICCTPCT in the last 72 hours.  Coagulation profile  Recent Labs Lab 07/15/15 0544  INR 1.16    No results for input(s): DDIMER in the last 72 hours.  Cardiac Enzymes  Recent Labs Lab 07/14/15 1039 07/14/15 1304  TROPONINI 0.03 0.04*    ------------------------------------------------------------------------------------------------------------------ Invalid input(s): POCBNP    Assessment & Plan   * Acute cholangitis with sepsis Blood culture with Clostridium perfringens, I have discussed the case with Dr. Ola Spurr recommends oral Augmentin later Continue IV Zosyn Blood culture showing gram-negative rods Seen by GI plan for ERCP tomorrow   * Diabetes mellitus with hypoglycemia  Patient will be nothing by mouth for ERCP. continue sliding scale insulin Blood glucose improved continue D5 containing fluids  *Elevated troponin due to demand ischemia no chest pain   * Hypertension Continue patient's metoprolol and ramipril from home. Continue Lopressor IV when necessary.  * Chronic congestive heart failure. Unknown diastolic or systolic. Currently compensated monitor fluid status  * DVT prophylaxis with Lovenox.     Code Status Orders        Start     Ordered   07/14/15 1437  Full code   Continuous     07/14/15 1437    Code Status History    Date Active Date Inactive Code Status Order ID Comments User Context   08/18/2014  6:45 PM 08/23/2014  7:13 PM Full Code [id number]  Marlyce Huge, MD ED    Advance Directive Documentation        Most Recent Value   Type of Advance Directive  Healthcare Power of Attorney   Pre-existing out of facility DNR order (yellow form or pink MOST form)     "MOST" Form in Place?             ConsultsGI surgery DVT Prophylaxis  Lovenox   Lab Results  Component Value Date   PLT 204 07/15/2015     Time Spent in minutes  25min  Greater than 50% of time spent in care coordination and counseling patient regarding the condition and plan of care.   Dustin Flock M.D on 07/16/2015 at 1:39 PM  Between 7am to 6pm - Pager - [phone]  After 6pm go to [email or web address] - password [email or web address] [facility]   Office  [phone]

## 2015-07-16 NOTE — Anesthesia Postprocedure Evaluation (Signed)
Anesthesia Post Note  Patient: Joseph Flowers  Procedure(s) Performed: Procedure(s) (LRB): ENDOSCOPIC RETROGRADE CHOLANGIOPANCREATOGRAPHY (ERCP) WITH PROPOFOL (N/A)  Patient location during evaluation: Endoscopy Anesthesia Type: General Level of consciousness: awake and alert Pain management: pain level controlled Vital Signs Assessment: post-procedure vital signs reviewed and stable Respiratory status: spontaneous breathing, nonlabored ventilation, respiratory function stable and patient connected to nasal cannula oxygen Cardiovascular status: blood pressure returned to baseline and stable Postop Assessment: no signs of nausea or vomiting Anesthetic complications: no    Last Vitals:  Filed Vitals:   07/16/15 1820 07/16/15 1829  BP: 175/87 174/80  Pulse: 75 74  Temp:  36.6 C  Resp: 21 17    Last Pain:  Filed Vitals:   07/16/15 1830  PainSc: 0-No pain                 Precious Haws Carolle Ishii

## 2015-07-16 NOTE — Anesthesia Preprocedure Evaluation (Signed)
Anesthesia Evaluation  Patient identified by MRN, date of birth, ID band Patient awake    Reviewed: Allergy & Precautions, NPO status , Patient's Chart, lab work & pertinent test results  History of Anesthesia Complications Negative for: history of anesthetic complications  Airway Mallampati: II       Dental   Pulmonary neg pulmonary ROS,           Cardiovascular hypertension, Pt. on medications and Pt. on home beta blockers + Past MI, + CABG, + Peripheral Vascular Disease and +CHF       Neuro/Psych negative neurological ROS     GI/Hepatic negative GI ROS, Neg liver ROS,   Endo/Other  diabetes, Type 2, Oral Hypoglycemic Agents  Renal/GU negative Renal ROS     Musculoskeletal   Abdominal   Peds  Hematology negative hematology ROS (+)   Anesthesia Other Findings   Reproductive/Obstetrics                             Anesthesia Physical Anesthesia Plan  ASA: III and emergent  Anesthesia Plan: General   Post-op Pain Management:    Induction: Intravenous  Airway Management Planned: Oral ETT  Additional Equipment:   Intra-op Plan:   Post-operative Plan:   Informed Consent: I have reviewed the patients History and Physical, chart, labs and discussed the procedure including the risks, benefits and alternatives for the proposed anesthesia with the patient or authorized representative who has indicated his/her understanding and acceptance.     Plan Discussed with:   Anesthesia Plan Comments:         Anesthesia Quick Evaluation

## 2015-07-16 NOTE — Progress Notes (Signed)
CC: Cholangitis Subjective: Patient reports he actually feels somewhat better today than yesterday. He is anxious to have this procedure done for resolution of his current infection.  Objective: Vital signs in last 24 hours: Temp:  [97.6 F (36.4 C)-97.9 F (36.6 C)] 97.9 F (36.6 C) (05/22 1459) Pulse Rate:  [73-76] 73 (05/22 0450) Resp:  [16-18] 18 (05/22 1459) BP: (129-137)/(61-68) 137/62 mmHg (05/22 1459) SpO2:  [93 %-99 %] 93 % (05/22 1459) Weight:  [93.639 kg (206 lb 7 oz)] 93.639 kg (206 lb 7 oz) (05/22 0450) Last BM Date: 07/14/15  Intake/Output from previous day: 05/21 0701 - 05/22 0700 In: 1353 [I.V.:1313; IV Piggyback:40] Out: 550 [Urine:550] Intake/Output this shift: Total I/O In: -  Out: 250 [Urine:250]  Physical exam:  Gen.: No acute distress, obviously jaundice Chest: Clear to auscultation Heart: Regular rhythm Abdomen: Soft, nontender, nondistended  Lab Results: CBC   Recent Labs  07/14/15 1039 07/15/15 0544  WBC 11.7* 15.9*  HGB 13.2 12.4*  HCT 39.1* 35.6*  PLT 185 204   BMET  Recent Labs  07/14/15 1039 07/15/15 0544  NA 134* 137  K 4.5 4.4  CL 100* 111  CO2 21* 21*  GLUCOSE 351* 73  BUN 10 17  CREATININE 1.03 0.96  CALCIUM 9.1 8.0*   PT/INR  Recent Labs  07/15/15 0544  LABPROT 15.0  INR 1.16   ABG No results for input(s): PHART, HCO3 in the last 72 hours.  Invalid input(s): PCO2, PO2  Studies/Results: No results found.  Anti-infectives: Anti-infectives    Start     Dose/Rate Route Frequency Ordered Stop   07/14/15 2200  [MAR Hold]  piperacillin-tazobactam (ZOSYN) IVPB 3.375 g     (MAR Hold since 07/16/15 1521)   3.375 g 12.5 mL/hr over 240 Minutes Intravenous Every 8 hours 07/14/15 1447     07/14/15 1430  piperacillin-tazobactam (ZOSYN) IVPB 3.375 g     3.375 g 100 mL/hr over 30 Minutes Intravenous  Once 07/14/15 1417 07/14/15 1517      Assessment/Plan:  82 year old male that is clearly jaundiced with evidence  of cholangitis. He is undergoing an ERCP today. Surgery will continue to follow to ensure no further surgical intervention is warranted in this patient with cholangitis. No plans for urgent surgical intervention at this time.  Joseph Naves T. Adonis Huguenin, MD, FACS  07/16/2015

## 2015-07-16 NOTE — Op Note (Signed)
[facility] Gastroenterology Patient Name: Joseph Flowers Procedure Date: 07/16/2015 3:36 PM MRN: [id number] Account #: [id number] Date of Birth: Jul 15, 1927 Admit Type: Inpatient Age: 82 Room: [facility] ENDO ROOM 4 Gender: Male Note Status: Finalized Procedure:            ERCP Indications:          Suspected ascending cholangitis Providers:            Lucilla Lame, MD Medicines:            General Anesthesia Complications:        No immediate complications. Procedure:            Pre-Anesthesia Assessment:                       - Prior to the procedure, a History and Physical was                        performed, and patient medications and allergies were                        reviewed. The patient's tolerance of previous                        anesthesia was also reviewed. The risks and benefits of                        the procedure and the sedation options and risks were                        discussed with the patient. All questions were                        answered, and informed consent was obtained. Prior                        Anticoagulants: The patient has taken no previous                        anticoagulant or antiplatelet agents. ASA Grade                        Assessment: III - A patient with severe systemic                        disease. After reviewing the risks and benefits, the                        patient was deemed in satisfactory condition to undergo                        the procedure.                       After obtaining informed consent, the scope was passed                        under direct vision. Throughout the procedure, the                        patient's blood pressure, pulse, and oxygen saturations  were monitored continuously. The Endosonoscope was                        introduced through the mouth, and used to inject                        contrast into and used to cannulate the bile duct. The                         ERCP was accomplished without difficulty. The patient                        tolerated the procedure well. Findings:      The scout film was normal. A standard esophagogastroduodenoscopy scope       was used for the examination of the upper gastrointestinal tract. The       scope was passed under direct vision through the upper GI tract. LA       Grade B (one or more mucosal breaks greater than 5 mm, not extending       between the tops of two mucosal folds) esophagitis with no bleeding was       found at the gastroesophageal junction. The esophagus was successfully       intubated under direct vision. The scope was advanced to a normal major       papilla in the descending duodenum without detailed examination of the       pharynx, larynx and associated structures, and upper GI tract. A       standard esophagogastroduodenoscopy scope was used for the examination       of the upper gastrointestinal tract. The scope was passed under direct       vision through the upper GI tract. A benign-appearing, intrinsic severe       stenosis was found at the pylorus. This was traversed. A standard       esophagogastroduodenoscopy scope was used for the examination of the       upper gastrointestinal tract. The scope was passed under direct vision       through the upper GI tract. Many non-bleeding superficial duodenal       ulcers with no stigmata of bleeding were found in the entire duodenum. A       TTS dilator was passed through the scope. Dilation with a 12-13.5-15 mm       pyloric balloon dilator was performed under fluoroscopic guidance at the       pylorus. A TTS dilator was passed through the scope. Dilation with a       12-13.5-15 mm balloon dilator was performed in the duodenum. The bile       duct was deeply cannulated with the short-nosed traction sphincterotome.       Contrast was injected. I personally interpreted the bile duct images.       There was brisk flow of contrast  through the ducts. Image quality was       excellent. Contrast extended to the entire biliary tree. Opacification       of the lower third of the main bile duct was seen. A wire was passed       into the biliary tree. Biliary sphincterotomy was made with a traction       (standard) sphincterotome using ERBE electrocautery. There was no  post-sphincterotomy bleeding. The biliary tree was swept with a 15 mm       balloon starting at the bifurcation. Pus was swept from the duct. The       biliary tree was swept with a 15 mm balloon starting at the bifurcation.       Sludge was swept from the duct. Impression:           - LA Grade B esophagitis.                       - Gastric stenosis was found at the pylorus.                       - Multiple non-bleeding duodenal ulcers with no                        stigmata of bleeding.                       - Dilation performed at the pylorus.                       - Dilation performed in the duodenum.                       - A biliary sphincterotomy was performed.                       - The biliary tree was swept and pus was found.                       - The biliary tree was swept and sludge was found. Recommendation:       - Watch for pancreatitis, bleeding, perforation, and                        cholangitis. Procedure Code(s):    --- Professional ---                       [phone], Endoscopic retrograde cholangiopancreatography                        (ERCP); with removal of calculi/debris from                        biliary/pancreatic duct(s)                       43262, Endoscopic retrograde cholangiopancreatography                        (ERCP); with sphincterotomy/papillotomy                       43245, Esophagogastroduodenoscopy, flexible, transoral;                        with dilation of gastric/duodenal stricture(s) (eg,                        balloon, bougie)                       [id number], Endoscopic catheterization of the biliary ductal  system, radiological supervision and interpretation                       [id number], 48, Intraluminal dilation of strictures and/or                        obstructions (eg, esophagus), radiological supervision                        and interpretation Diagnosis Code(s):    --- Professional ---                       K20.9, Esophagitis, unspecified                       K31.1, Adult hypertrophic pyloric stenosis                       K26.9, Duodenal ulcer, unspecified as acute or chronic,                        without hemorrhage or perforation                       K83.0, Cholangitis CPT copyright 2016 [employer]. All rights reserved. The codes documented in this report are preliminary and upon coder review may  be revised to meet current compliance requirements. Lucilla Lame, MD 07/16/2015 5:41:40 PM This report has been signed electronically. Number of Addenda: 0 Note Initiated On: 07/16/2015 3:36 PM      [facility]

## 2015-07-16 NOTE — Transfer of Care (Signed)
Immediate Anesthesia Transfer of Care Note  Patient: Joseph Flowers  Procedure(s) Performed: Procedure(s): ENDOSCOPIC RETROGRADE CHOLANGIOPANCREATOGRAPHY (ERCP) WITH PROPOFOL (N/A)  Patient Location: PACU  Anesthesia Type:General  Level of Consciousness: awake, alert , oriented and patient cooperative  Airway & Oxygen Therapy: Patient Spontanous Breathing and Patient connected to face mask oxygen  Post-op Assessment: Report given to RN, Post -op Vital signs reviewed and stable and Patient moving all extremities X 4  Post vital signs: Reviewed and stable  Last Vitals:  Filed Vitals:   07/16/15 0450 07/16/15 1459  BP: 134/61 137/62  Pulse: 73   Temp: 36.4 C 36.6 C  Resp: 18 18    Last Pain:  Filed Vitals:   07/16/15 1500  PainSc: 2          Complications: No apparent anesthesia complications

## 2015-07-16 NOTE — Anesthesia Procedure Notes (Signed)
Procedure Name: Intubation Date/Time: 07/16/2015 4:10 PM Performed by: Silvana Newness Pre-anesthesia Checklist: Patient identified, Emergency Drugs available, Suction available, Patient being monitored and Timeout performed Patient Re-evaluated:Patient Re-evaluated prior to inductionOxygen Delivery Method: Circle system utilized Preoxygenation: Pre-oxygenation with 100% oxygen Intubation Type: IV induction Ventilation: Mask ventilation without difficulty Laryngoscope Size: Mac and 4 Grade View: Grade I Tube type: Oral Tube size: 7.5 mm Number of attempts: 1 Airway Equipment and Method: Rigid stylet Placement Confirmation: ETT inserted through vocal cords under direct vision,  positive ETCO2 and breath sounds checked- equal and bilateral Secured at: 21 cm Tube secured with: Tape Dental Injury: Teeth and Oropharynx as per pre-operative assessment

## 2015-07-16 NOTE — OR Nursing (Signed)
Contacted Dr. Amie Critchley to check patient for breathing difficulty.

## 2015-07-16 NOTE — Consult Note (Signed)
Pharmacy Antibiotic Note  Joseph Flowers is a 82 y.o. male admitted on 07/14/2015 with intra-abdominal infection.  Pharmacy has been consulted for Zosyn dosing.  Plan: Zosyn 3.375g IV q8h (4 hour infusion).  Height: 5\' 10"  (177.8 cm) Weight: 206 lb 7 oz (93.639 kg) IBW/kg (Calculated) : 73  Temp (24hrs), Avg:97.7 F (36.5 C), Min:97.6 F (36.4 C), Max:97.9 F (36.6 C)   Recent Labs Lab 07/14/15 1039 07/14/15 1304 07/14/15 1833 07/15/15 0544  WBC 11.7*  --   --  15.9*  CREATININE 1.03  --   --  0.96  LATICACIDVEN 4.6* 4.1* 2.5*  --     Estimated Creatinine Clearance: 64.6 mL/min (by C-G formula based on Cr of 0.96).    No Known Allergies  Antimicrobials this admission: Zosyn 5/20 >>  Microbiology results: 5/20 BCx x2: pending   Thank you for allowing pharmacy to be a part of this patient's care.  Rayna Sexton, PharmD, BCPS Clinical Pharmacist 07/16/2015 9:18 AM

## 2015-07-17 ENCOUNTER — Encounter: Payer: Self-pay | Admitting: Gastroenterology

## 2015-07-17 LAB — COMPREHENSIVE METABOLIC PANEL
ALT: 148 U/L — ABNORMAL HIGH (ref 17–63)
AST: 82 U/L — ABNORMAL HIGH (ref 15–41)
Albumin: 2.5 g/dL — ABNORMAL LOW (ref 3.5–5.0)
Alkaline Phosphatase: 214 U/L — ABNORMAL HIGH (ref 38–126)
Anion gap: 5 (ref 5–15)
BILIRUBIN TOTAL: 1.9 mg/dL — AB (ref 0.3–1.2)
BUN: 15 mg/dL (ref 6–20)
CALCIUM: 7.9 mg/dL — AB (ref 8.9–10.3)
CHLORIDE: 114 mmol/L — AB (ref 101–111)
CO2: 21 mmol/L — ABNORMAL LOW (ref 22–32)
CREATININE: 0.8 mg/dL (ref 0.61–1.24)
Glucose, Bld: 122 mg/dL — ABNORMAL HIGH (ref 65–99)
Potassium: 4.2 mmol/L (ref 3.5–5.1)
Sodium: 140 mmol/L (ref 135–145)
TOTAL PROTEIN: 5.4 g/dL — AB (ref 6.5–8.1)

## 2015-07-17 LAB — GLUCOSE, CAPILLARY
GLUCOSE-CAPILLARY: 100 mg/dL — AB (ref 65–99)
GLUCOSE-CAPILLARY: 177 mg/dL — AB (ref 65–99)
GLUCOSE-CAPILLARY: 231 mg/dL — AB (ref 65–99)
Glucose-Capillary: 103 mg/dL — ABNORMAL HIGH (ref 65–99)
Glucose-Capillary: 104 mg/dL — ABNORMAL HIGH (ref 65–99)
Glucose-Capillary: 123 mg/dL — ABNORMAL HIGH (ref 65–99)
Glucose-Capillary: 139 mg/dL — ABNORMAL HIGH (ref 65–99)
Glucose-Capillary: 182 mg/dL — ABNORMAL HIGH (ref 65–99)

## 2015-07-17 LAB — CBC WITH DIFFERENTIAL/PLATELET
Basophils Absolute: 0 10*3/uL (ref 0–0.1)
Basophils Relative: 0 %
Eosinophils Absolute: 0.2 10*3/uL (ref 0–0.7)
HEMATOCRIT: 36.3 % — AB (ref 40.0–52.0)
Hemoglobin: 12.2 g/dL — ABNORMAL LOW (ref 13.0–18.0)
Lymphs Abs: 1.4 10*3/uL (ref 1.0–3.6)
MCH: 30.1 pg (ref 26.0–34.0)
MCHC: 33.7 g/dL (ref 32.0–36.0)
MCV: 89.3 fL (ref 80.0–100.0)
MONO ABS: 0.5 10*3/uL (ref 0.2–1.0)
NEUTROS ABS: 6.4 10*3/uL (ref 1.4–6.5)
Neutrophils Relative %: 77 %
Platelets: 194 10*3/uL (ref 150–440)
RBC: 4.06 MIL/uL — ABNORMAL LOW (ref 4.40–5.90)
RDW: 14.1 % (ref 11.5–14.5)
WBC: 8.4 10*3/uL (ref 3.8–10.6)

## 2015-07-17 MED ORDER — AMOXICILLIN-POT CLAVULANATE 875-125 MG PO TABS
1.0000 | ORAL_TABLET | Freq: Two times a day (BID) | ORAL | Status: DC
  Administered 2015-07-17 – 2015-07-20 (×7): 1 via ORAL
  Filled 2015-07-17 (×7): qty 1

## 2015-07-17 NOTE — Progress Notes (Signed)
CC: Cholangitis Subjective: Patient reports feeling well this morning. Denies any pain, nausea, vomiting. He has been passing flatus. He's also been tolerating his diet.  Objective: Vital signs in last 24 hours: Temp:  [97.5 F (36.4 C)-98.7 F (37.1 C)] 97.5 F (36.4 C) (05/23 0505) Pulse Rate:  [67-81] 72 (05/23 0505) Resp:  [15-21] 17 (05/23 0505) BP: (137-183)/(62-94) 138/65 mmHg (05/23 0505) SpO2:  [93 %-100 %] 99 % (05/23 0505) Weight:  [96.163 kg (212 lb)] 96.163 kg (212 lb) (05/23 0500) Last BM Date: 07/16/15  Intake/Output from previous day: 05/22 0701 - 05/23 0700 In: 300 [I.V.:300] Out: 255 [Urine:250; Blood:5] Intake/Output this shift: Total I/O In: 360 [P.O.:360] Out: -   Physical exam:  Gen.: No acute distress Chest: Clear to auscultation Heart: Regular rhythm Abdomen: Soft, nontender, nondistended Skin: Previously noted obvious jaundice appears resolved or resolving Lab Results: CBC   Recent Labs  07/15/15 0544  WBC 15.9*  HGB 12.4*  HCT 35.6*  PLT 204   BMET  Recent Labs  07/15/15 0544  NA 137  K 4.4  CL 111  CO2 21*  GLUCOSE 73  BUN 17  CREATININE 0.96  CALCIUM 8.0*   PT/INR  Recent Labs  07/15/15 0544  LABPROT 15.0  INR 1.16   ABG No results for input(s): PHART, HCO3 in the last 72 hours.  Invalid input(s): PCO2, PO2  Studies/Results: No results found.  Anti-infectives: Anti-infectives    Start     Dose/Rate Route Frequency Ordered Stop   07/14/15 2200  piperacillin-tazobactam (ZOSYN) IVPB 3.375 g     3.375 g 12.5 mL/hr over 240 Minutes Intravenous Every 8 hours 07/14/15 1447     07/14/15 1430  piperacillin-tazobactam (ZOSYN) IVPB 3.375 g     3.375 g 100 mL/hr over 30 Minutes Intravenous  Once 07/14/15 1417 07/14/15 1517      Assessment/Plan:  82 year old male admitted with cholangitis had ERCP yesterday. Clinically improving. No recent labs to follow. At present there is no indication for surgical  intervention.  Nuriyah Hanline T. Adonis Huguenin, MD, FACS  07/17/2015

## 2015-07-17 NOTE — Progress Notes (Signed)
[place] at [facility]                                                                                                                                                                                            Patient Demographics   Bryceton Yano, is a 82 y.o. male, DOB - 12/27/1927, ID:[id number]  Admit date - 07/14/2015   Admitting Physician Hillary Bow, MD  Outpatient Primary MD for the patient is FITZGERALD, DAVID [name], MD   LOS - 3  Subjective:Patient continues to have pain in his abdomen. No nausea vomiting or diarrhea     Review of Systems:   CONSTITUTIONAL: No documented fever. No fatigue, weakness. No weight gain, no weight loss.  EYES: No blurry or double vision.  ENT: No tinnitus. No postnasal drip. No redness of the oropharynx.  RESPIRATORY: No cough, no wheeze, no hemoptysis. No dyspnea.  CARDIOVASCULAR: No chest pain. No orthopnea. No palpitations. No syncope.  GASTROINTESTINAL: No nausea, no vomiting or diarrhea. Positive abdominal pain. No melena or hematochezia.  GENITOURINARY: No dysuria or hematuria.  ENDOCRINE: No polyuria or nocturia. No heat or cold intolerance.  HEMATOLOGY: No anemia. No bruising. No bleeding.  INTEGUMENTARY: No rashes. No lesions.  MUSCULOSKELETAL: No arthritis. No swelling. No gout.  NEUROLOGIC: No numbness, tingling, or ataxia. No seizure-type activity.  PSYCHIATRIC: No anxiety. No insomnia. No ADD.    Vitals:   Filed Vitals:   07/17/15 0500 07/17/15 0505 07/17/15 1117 07/17/15 1348  BP:  138/65 149/68 151/63  Pulse:  72  79  Temp:  97.5 F (36.4 C)  97.9 F (36.6 C)  TempSrc:  Oral  Oral  Resp:  17    Height:      Weight: 96.163 kg (212 lb)     SpO2:  99%      Wt Readings from Last 3 Encounters:  07/17/15 96.163 kg (212 lb)  10/06/14 91.6 kg (201 lb 15.1 oz)  08/30/14 92.987 kg (205 lb)     Intake/Output Summary (Last 24 hours) at 07/17/15 1400 Last data filed at 07/17/15 1300  Gross per 24 hour  Intake    900 ml  Output    105 ml  Net    795 ml    Physical Exam:   GENERAL: Pleasant-appearing in no apparent distress.  HEAD, EYES, EARS, NOSE AND THROAT: Atraumatic, normocephalic. Extraocular muscles are intact. Pupils equal and reactive to light. Sclerae anicteric. No conjunctival injection. No oro-pharyngeal erythema.  NECK: Supple. There is no jugular venous distention. No bruits, no lymphadenopathy, no thyromegaly.  HEART: Regular rate and rhythm,. No murmurs, no  rubs, no clicks.  LUNGS: Clear to auscultation bilaterally. No rales or rhonchi. No wheezes.  ABDOMEN: Soft, flat,  tenderness on the right upper quadrant nondistended. Has good bowel sounds. No hepatosplenomegaly appreciated.  EXTREMITIES: No evidence of any cyanosis, clubbing, or peripheral edema.  +2 pedal and radial pulses bilaterally.  NEUROLOGIC: The patient is alert, awake, and oriented x3 with no focal motor or sensory deficits appreciated bilaterally.  SKIN: Moist and warm with no rashes appreciated.  Psych: Not anxious, depressed LN: No inguinal LN enlargement    Antibiotics   Anti-infectives    Start     Dose/Rate Route Frequency Ordered Stop   07/14/15 2200  piperacillin-tazobactam (ZOSYN) IVPB 3.375 g     3.375 g 12.5 mL/hr over 240 Minutes Intravenous Every 8 hours 07/14/15 1447     07/14/15 1430  piperacillin-tazobactam (ZOSYN) IVPB 3.375 g     3.375 g 100 mL/hr over 30 Minutes Intravenous  Once 07/14/15 1417 07/14/15 1517      Medications   Scheduled Meds: . antiseptic oral rinse  7 mL Mouth Rinse q12n4p  . chlorhexidine  15 mL Mouth Rinse BID  . enoxaparin (LOVENOX) injection  40 mg Subcutaneous Q24H  . finasteride  5 mg Oral Daily  . fluorouracil  1 application Topical BID  . insulin aspart  0-15 Units Subcutaneous Q4H  . ketotifen  1 drop Both Eyes BID  . metoprolol succinate  25 mg Oral BID  .  morphine injection  4 mg Intravenous Once  . ondansetron (ZOFRAN) IV   4 mg Intravenous Once  . pantoprazole  40 mg Oral BID AC  . piperacillin-tazobactam (ZOSYN)  IV  3.375 g Intravenous Q8H  . ramipril  10 mg Oral Daily   Continuous Infusions: . dextrose 5 % and 0.9% NaCl 100 mL/hr at 07/17/15 0403   PRN Meds:.acetaminophen **OR** acetaminophen, albuterol, bisacodyl, HYDROcodone-acetaminophen, metoprolol, morphine injection, ondansetron **OR** ondansetron (ZOFRAN) IV, polyethylene glycol   Data Review:   Micro Results Recent Results (from the past 240 hour(s))  Urine culture     Status: None   Collection Time: 07/14/15 10:39 AM  Result Value Ref Range Status   Specimen Description URINE, RANDOM  Final   Special Requests NONE  Final   Culture NO GROWTH Performed at [facility]   Final   Report Status 07/15/2015 FINAL  Final  Blood Culture (routine x 2)     Status: Abnormal (Preliminary result)   Collection Time: 07/14/15 12:19 PM  Result Value Ref Range Status   Specimen Description BLOOD RIGHT ARM  Final   Special Requests BOTTLES DRAWN AEROBIC AND ANAEROBIC 1 CC  Final   Culture  Setup Time   Final    GRAM VARIABLE ROD ANAEROBIC BOTTLE ONLY CRITICAL RESULT CALLED TO, READ BACK BY AND VERIFIED WITH: PREVIOUSLY CALLED TO MATT MCBANE AND ROBYN THOMAS AT 0122 ON 07/15/15 [facility] CONFIRMED BY [facility]    Culture (A)  Final    CLOSTRIDIUM PERFRINGENS ANAEROBIC BOTTLE ONLY BETA LACTAMASE POSITIVE    Report Status PENDING  Incomplete  Blood Culture (routine x 2)     Status: Abnormal (Preliminary result)   Collection Time: 07/14/15 12:19 PM  Result Value Ref Range Status   Specimen Description BLOOD LEFT FOREARM  Final   Special Requests BOTTLES DRAWN AEROBIC AND ANAEROBIC 1 CC  Final   Culture  Setup Time   Final    GRAM VARIABLE ROD ANAEROBIC BOTTLE ONLY CRITICAL RESULT CALLED TO, READ BACK BY AND  VERIFIED WITH: MATT MCBANE AND [place] ON 07/15/15 [facility] CONFIRMED BY [facility]    Culture (A)  Final    CLOSTRIDIUM PERFRINGENS ANAEROBIC  BOTTLE ONLY BETA LACTAMASE POSITIVE    Report Status PENDING  Incomplete  Blood Culture ID Panel (Reflexed)     Status: None   Collection Time: 07/14/15 12:19 PM  Result Value Ref Range Status   Enterococcus species NOT DETECTED NOT DETECTED Final   Vancomycin resistance NOT DETECTED NOT DETECTED Final   Listeria monocytogenes NOT DETECTED NOT DETECTED Final   Staphylococcus species NOT DETECTED NOT DETECTED Final   Staphylococcus aureus NOT DETECTED NOT DETECTED Final   Methicillin resistance NOT DETECTED NOT DETECTED Final   Streptococcus species NOT DETECTED NOT DETECTED Final   Streptococcus agalactiae NOT DETECTED NOT DETECTED Final   Streptococcus pneumoniae NOT DETECTED NOT DETECTED Final   Streptococcus pyogenes NOT DETECTED NOT DETECTED Final   Acinetobacter baumannii NOT DETECTED NOT DETECTED Final   Enterobacteriaceae species NOT DETECTED NOT DETECTED Final   Enterobacter cloacae complex NOT DETECTED NOT DETECTED Final   Escherichia coli NOT DETECTED NOT DETECTED Final   Klebsiella oxytoca NOT DETECTED NOT DETECTED Final   Klebsiella pneumoniae NOT DETECTED NOT DETECTED Final   Proteus species NOT DETECTED NOT DETECTED Final   Serratia marcescens NOT DETECTED NOT DETECTED Final   Carbapenem resistance NOT DETECTED NOT DETECTED Final   Haemophilus influenzae NOT DETECTED NOT DETECTED Final   Neisseria meningitidis NOT DETECTED NOT DETECTED Final   Pseudomonas aeruginosa NOT DETECTED NOT DETECTED Final   Candida albicans NOT DETECTED NOT DETECTED Final   Candida glabrata NOT DETECTED NOT DETECTED Final   Candida krusei NOT DETECTED NOT DETECTED Final   Candida parapsilosis NOT DETECTED NOT DETECTED Final   Candida tropicalis NOT DETECTED NOT DETECTED Final    Radiology Reports Ct Abdomen Pelvis W Contrast  07/14/2015  CLINICAL DATA:  C/o abd pain and cramping x 2 days. Pt reports nausea and diarrhea yesterday. Denies vomiting. 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  EXAM: CT ABDOMEN AND PELVIS WITH CONTRAST TECHNIQUE: Multidetector CT imaging of the abdomen and pelvis was performed using the standard protocol following bolus administration of intravenous contrast. CONTRAST:  129mL ISOVUE-300 IOPAMIDOL (ISOVUE-300) INJECTION 61% COMPARISON:  08/18/2014 FINDINGS: Lung bases: Minor subsegmental atelectasis and/ or scarring. Heart normal size. Few mildly prominent, but nonenlarged, lymph nodes adjacent to the inferior descending thoracic aorta similar to the prior exam. Hepatobiliary: Fatty infiltration of the liver. No liver mass or focal lesion. Small dependent gallstones. No gallbladder wall thickening. There is enhancement along the wall of the common bile duct. Mild hazy opacity is noted in the fat along the porta hepatis. These findings are new from the prior exam. There is no bile duct dilation. Spleen, pancreas, adrenal glands:  Unremarkable. Kidneys, ureters, bladder: 5 mm low-density lesion in the upper pole the right kidney consistent with a cyst, stable. No other renal masses or lesions, no stones and no hydronephrosis. Normal ureters. Bladder is unremarkable. Lymph nodes:  No pathologically enlarged lymph nodes. Ascites:  None. Gastrointestinal: Scattered left colon diverticula. No diverticulitis. Stomach is unremarkable. Normal small bowel. There is some hazy opacity at the root of the small bowel mesenteries similar to the prior exam. Normal appendix is visualized. Vascular: There are scattered aortic atherosclerotic calcifications with more dense calcification along splenic artery. No aneurysm. Musculoskeletal:  No osteoblastic or osteolytic lesions. IMPRESSION: 1. Mild enhancement of the wall of the common bile duct with  mild adjacent hazy inflammatory type change in the porta hepatis fat. Consider cholangitis in the proper clinical setting. There is no evidence of acute cholecystitis. Small gallstone is evident the gallbladder fundus. 2. There is some focal  opacity at the root of the small bowel mesentery, which is similar to prior exam 3. Hepatic steatosis. 4. Left colon diverticula.  No diverticulitis. Electronically Signed   By: Lajean Manes M.D.   On: 07/14/2015 14:12     CBC  Recent Labs Lab 07/14/15 1039 07/15/15 0544 07/17/15 1234  WBC 11.7* 15.9* 8.4  HGB 13.2 12.4* 12.2*  HCT 39.1* 35.6* 36.3*  PLT 185 204 194  MCV 87.4 88.2 89.3  MCH 29.6 30.8 30.1  MCHC 33.8 34.9 33.7  RDW 13.1 14.1 14.1  LYMPHSABS 0.2*  --  1.4  MONOABS 0.4  --  0.5  EOSABS 0.0  --  0.2  BASOSABS 0.0  --  0.0    Chemistries   Recent Labs Lab 07/14/15 1039 07/15/15 0544  NA 134* 137  K 4.5 4.4  CL 100* 111  CO2 21* 21*  GLUCOSE 351* 73  BUN 10 17  CREATININE 1.03 0.96  CALCIUM 9.1 8.0*  AST 545* 223*  ALT 466* 269*  ALKPHOS 133* 101  BILITOT 4.4* 3.4*   ------------------------------------------------------------------------------------------------------------------ estimated creatinine clearance is 65.5 mL/min (by C-G formula based on Cr of 0.96). ------------------------------------------------------------------------------------------------------------------ No results for input(s): HGBA1C in the last 72 hours. ------------------------------------------------------------------------------------------------------------------ No results for input(s): CHOL, HDL, LDLCALC, TRIG, CHOLHDL, LDLDIRECT in the last 72 hours. ------------------------------------------------------------------------------------------------------------------ No results for input(s): TSH, T4TOTAL, T3FREE, THYROIDAB in the last 72 hours.  Invalid input(s): FREET3 ------------------------------------------------------------------------------------------------------------------ No results for input(s): VITAMINB12, FOLATE, FERRITIN, TIBC, IRON, RETICCTPCT in the last 72 hours.  Coagulation profile  Recent Labs Lab 07/15/15 0544  INR 1.16    No results for  input(s): DDIMER in the last 72 hours.  Cardiac Enzymes  Recent Labs Lab 07/14/15 1039 07/14/15 1304  TROPONINI 0.03 0.04*   ------------------------------------------------------------------------------------------------------------------ Invalid input(s): POCBNP    Assessment & Plan   * Acute cholangitis with sepsis Blood culture with Clostridium perfringens,  Change to oral Augmentin Blood culture showing Clostridium Status post hysterectomy  *Gastric stenosis status post dilation  *Duodenal ulceration Protonix twice a day   * Diabetes mellitus with hypoglycemia  Started back on a diet. DC D5 containing fluid   *Elevated troponin due to demand ischemia no chest pain   * Hypertension Continue patient's metoprolol and ramipril from home. Continue Lopressor IV when necessary.  * Chronic congestive heart failure. Unknown diastolic or systolic. Currently compensated monitor fluid status  * DVT prophylaxis with Lovenox.     Code Status Orders        Start     Ordered   07/14/15 1437  Full code   Continuous     07/14/15 1437    Code Status History    Date Active Date Inactive Code Status Order ID Comments User Context   08/18/2014  6:45 PM 08/23/2014  7:13 PM Full Code [id number]  Marlyce Huge, MD ED    Advance Directive Documentation        Most Recent Value   Type of Advance Directive  Healthcare Power of Attorney   Pre-existing out of facility DNR order (yellow form or pink MOST form)     "MOST" Form in Place?             ConsultsGI surgery DVT Prophylaxis  Lovenox   Lab Results  Component Value Date   PLT 194 07/17/2015     Time Spent in minutes   90min  Greater than 50% of time spent in care coordination and counseling patient regarding the condition and plan of care.   Dustin Flock M.D on 07/17/2015 at 2:00 PM  Between 7am to 6pm - Pager - [phone]  After 6pm go to [email or web address] - password [email or web address] [employer] [facility] [place]  [facility]   Office  [phone]

## 2015-07-18 ENCOUNTER — Inpatient Hospital Stay: Payer: Medicare Other

## 2015-07-18 ENCOUNTER — Encounter: Payer: Self-pay | Admitting: Cardiology

## 2015-07-18 ENCOUNTER — Inpatient Hospital Stay (HOSPITAL_COMMUNITY)
Admit: 2015-07-18 | Discharge: 2015-07-18 | Disposition: A | Discharge status: Home / Self Care | Payer: Medicare Other | Attending: Internal Medicine | Admitting: Internal Medicine

## 2015-07-18 DIAGNOSIS — K830 Cholangitis: Secondary | ICD-10-CM

## 2015-07-18 DIAGNOSIS — K269 Duodenal ulcer, unspecified as acute or chronic, without hemorrhage or perforation: Secondary | ICD-10-CM

## 2015-07-18 DIAGNOSIS — I5030 Unspecified diastolic (congestive) heart failure: Secondary | ICD-10-CM | POA: Diagnosis present

## 2015-07-18 DIAGNOSIS — Z951 Presence of aortocoronary bypass graft: Secondary | ICD-10-CM

## 2015-07-18 DIAGNOSIS — I2510 Atherosclerotic heart disease of native coronary artery without angina pectoris: Secondary | ICD-10-CM | POA: Diagnosis present

## 2015-07-18 DIAGNOSIS — I4891 Unspecified atrial fibrillation: Secondary | ICD-10-CM | POA: Diagnosis present

## 2015-07-18 LAB — COMPREHENSIVE METABOLIC PANEL
ALK PHOS: 200 U/L — AB (ref 38–126)
ALT: 120 U/L — AB (ref 17–63)
AST: 64 U/L — AB (ref 15–41)
Albumin: 2.4 g/dL — ABNORMAL LOW (ref 3.5–5.0)
Anion gap: 6 (ref 5–15)
BUN: 12 mg/dL (ref 6–20)
CHLORIDE: 113 mmol/L — AB (ref 101–111)
CO2: 21 mmol/L — ABNORMAL LOW (ref 22–32)
CREATININE: 0.79 mg/dL (ref 0.61–1.24)
Calcium: 8 mg/dL — ABNORMAL LOW (ref 8.9–10.3)
GFR calc Af Amer: >60 mL/min (ref >60)
Glucose, Bld: 113 mg/dL — ABNORMAL HIGH (ref 65–99)
Potassium: 3.7 mmol/L (ref 3.5–5.1)
Sodium: 140 mmol/L (ref 135–145)
Total Bilirubin: 1.7 mg/dL — ABNORMAL HIGH (ref 0.3–1.2)
Total Protein: 5.9 g/dL — ABNORMAL LOW (ref 6.5–8.1)

## 2015-07-18 LAB — GLUCOSE, CAPILLARY
GLUCOSE-CAPILLARY: 102 mg/dL — AB (ref 65–99)
GLUCOSE-CAPILLARY: 111 mg/dL — AB (ref 65–99)
GLUCOSE-CAPILLARY: 138 mg/dL — AB (ref 65–99)
Glucose-Capillary: 180 mg/dL — ABNORMAL HIGH (ref 65–99)
Glucose-Capillary: 179 mg/dL — ABNORMAL HIGH (ref 65–99)

## 2015-07-18 LAB — MAGNESIUM: MAGNESIUM: 2.1 mg/dL (ref 1.7–2.4)

## 2015-07-18 LAB — TSH: TSH: 5.357 u[IU]/mL — ABNORMAL HIGH (ref 0.350–4.500)

## 2015-07-18 LAB — TROPONIN I
TROPONIN I: 0.1 ng/mL — AB (ref <0.031)
TROPONIN I: 0.12 ng/mL — AB (ref <0.031)

## 2015-07-18 MED ORDER — POTASSIUM CHLORIDE CRYS ER 20 MEQ PO TBCR
20.0000 meq | EXTENDED_RELEASE_TABLET | Freq: Two times a day (BID) | ORAL | Status: DC
  Administered 2015-07-18 – 2015-07-20 (×5): 20 meq via ORAL
  Filled 2015-07-18 (×5): qty 1

## 2015-07-18 MED ORDER — FUROSEMIDE 10 MG/ML IJ SOLN
40.0000 mg | Freq: Once | INTRAMUSCULAR | Status: AC
  Administered 2015-07-18: 14:00:00 40 mg via INTRAVENOUS
  Filled 2015-07-18: qty 4

## 2015-07-18 MED ORDER — METOPROLOL SUCCINATE ER 50 MG PO TB24
50.0000 mg | ORAL_TABLET | Freq: Two times a day (BID) | ORAL | Status: DC
Start: 2015-07-18 — End: 2015-07-20
  Administered 2015-07-18 – 2015-07-20 (×4): 50 mg via ORAL
  Filled 2015-07-18 (×4): qty 1

## 2015-07-18 MED ORDER — FUROSEMIDE 10 MG/ML IJ SOLN
40.0000 mg | Freq: Two times a day (BID) | INTRAMUSCULAR | Status: DC
  Administered 2015-07-19 – 2015-07-20 (×3): 40 mg via INTRAVENOUS
  Filled 2015-07-18 (×3): qty 4

## 2015-07-18 NOTE — Progress Notes (Signed)
*  PRELIMINARY RESULTS* Echocardiogram 2D Echocardiogram has been performed.  Joseph Flowers 07/18/2015, 3:29 PM

## 2015-07-18 NOTE — Progress Notes (Signed)
[place] at [facility]                                                                                                                                                                                            Patient Demographics   Joseph Flowers, is a 82 y.o. male, DOB - 06-08-1927, [id number]  Admit date - 07/14/2015   Admitting Physician Hillary Bow, MD  Outpatient Primary MD for the patient is FITZGERALD, DAVID [name], MD   LOS - 4  Subjective:Patient got up to go to the bathroom and became very short of breath heart rate increased. A. fib with rapid ventricular rate.     Review of Systems:   CONSTITUTIONAL: No documented fever. No fatigue, weakness. No weight gain, no weight loss.  EYES: No blurry or double vision.  ENT: No tinnitus. No postnasal drip. No redness of the oropharynx.  RESPIRATORY: No cough, no wheeze, no hemoptysis. Positive dyspnea.  CARDIOVASCULAR: No chest pain. No orthopnea. No palpitations. No syncope.  GASTROINTESTINAL: No nausea, no vomiting or diarrhea. Positive abdominal pain. No melena or hematochezia.  GENITOURINARY: No dysuria or hematuria.  ENDOCRINE: No polyuria or nocturia. No heat or cold intolerance.  HEMATOLOGY: No anemia. No bruising. No bleeding.  INTEGUMENTARY: No rashes. No lesions.  MUSCULOSKELETAL: No arthritis. No swelling. No gout.  NEUROLOGIC: No numbness, tingling, or ataxia. No seizure-type activity.  PSYCHIATRIC: No anxiety. No insomnia. No ADD.    Vitals:   Filed Vitals:   07/18/15 0500 07/18/15 0552 07/18/15 0815 07/18/15 1105  BP:  136/80 134/87 133/46  Pulse:  90 90 92  Temp:  98.1 F (36.7 C) 98.4 F (36.9 C) 97.8 F (36.6 C)  TempSrc:  Oral Oral Oral  Resp:  18    Height:      Weight: 95.845 kg (211 lb 4.8 oz)     SpO2:  94% 93% 95%    Wt Readings from Last 3 Encounters:  07/18/15 95.845 kg (211 lb 4.8 oz)  10/06/14 91.6 kg (201 lb 15.1 oz)  08/30/14 92.987 kg (205 lb)      Intake/Output Summary (Last 24 hours) at 07/18/15 1406 Last data filed at 07/18/15 1345  Gross per 24 hour  Intake      0 ml  Output    525 ml  Net   -525 ml    Physical Exam:   GENERAL: Pleasant-appearing in no apparent distress.  HEAD, EYES, EARS, NOSE AND THROAT: Atraumatic, normocephalic. Extraocular muscles are intact. Pupils equal and reactive to light. Sclerae anicteric. No conjunctival injection. No oro-pharyngeal erythema.  NECK: Supple. There is  no jugular venous distention. No bruits, no lymphadenopathy, no thyromegaly.  HEART: irrg irrg,. No murmurs, no rubs, no clicks.  LUNGS: Clear to auscultation bilaterally. No rales or rhonchi. No wheezes.  ABDOMEN: Soft, flat,  tenderness on the right upper quadrant nondistended. Has good bowel sounds. No hepatosplenomegaly appreciated.  EXTREMITIES: No evidence of any cyanosis, clubbing, or peripheral edema.  +2 pedal and radial pulses bilaterally.  NEUROLOGIC: The patient is alert, awake, and oriented x3 with no focal motor or sensory deficits appreciated bilaterally.  SKIN: Moist and warm with no rashes appreciated.  Psych: Not anxious, depressed LN: No inguinal LN enlargement    Antibiotics   Anti-infectives    Start     Dose/Rate Route Frequency Ordered Stop   07/17/15 1415  amoxicillin-clavulanate (AUGMENTIN) 875-125 MG per tablet 1 tablet     1 tablet Oral Every 12 hours 07/17/15 1404     07/14/15 2200  piperacillin-tazobactam (ZOSYN) IVPB 3.375 g  Status:  Discontinued     3.375 g 12.5 mL/hr over 240 Minutes Intravenous Every 8 hours 07/14/15 1447 07/17/15 1404   07/14/15 1430  piperacillin-tazobactam (ZOSYN) IVPB 3.375 g     3.375 g 100 mL/hr over 30 Minutes Intravenous  Once 07/14/15 1417 07/14/15 1517      Medications   Scheduled Meds: . amoxicillin-clavulanate  1 tablet Oral Q12H  . antiseptic oral rinse  7 mL Mouth Rinse q12n4p  . chlorhexidine  15 mL Mouth Rinse BID  . enoxaparin (LOVENOX) injection   40 mg Subcutaneous Q24H  . finasteride  5 mg Oral Daily  . fluorouracil  1 application Topical BID  . furosemide  40 mg Intravenous BID  . furosemide  40 mg Intravenous Once  . insulin aspart  0-15 Units Subcutaneous Q4H  . ketotifen  1 drop Both Eyes BID  . metoprolol succinate  50 mg Oral BID  .  morphine injection  4 mg Intravenous Once  . ondansetron (ZOFRAN) IV  4 mg Intravenous Once  . pantoprazole  40 mg Oral BID AC  . potassium chloride  20 mEq Oral BID  . ramipril  10 mg Oral Daily   Continuous Infusions:   PRN Meds:.acetaminophen **OR** acetaminophen, albuterol, bisacodyl, HYDROcodone-acetaminophen, metoprolol, morphine injection, ondansetron **OR** ondansetron (ZOFRAN) IV, polyethylene glycol   Data Review:   Micro Results Recent Results (from the past 240 hour(s))  Urine culture     Status: None   Collection Time: 07/14/15 10:39 AM  Result Value Ref Range Status   Specimen Description URINE, RANDOM  Final   Special Requests NONE  Final   Culture NO GROWTH Performed at [facility]   Final   Report Status 07/15/2015 FINAL  Final  Blood Culture (routine x 2)     Status: Abnormal (Preliminary result)   Collection Time: 07/14/15 12:19 PM  Result Value Ref Range Status   Specimen Description BLOOD RIGHT ARM  Final   Special Requests BOTTLES DRAWN AEROBIC AND ANAEROBIC 1 CC  Final   Culture  Setup Time   Final    GRAM VARIABLE ROD ANAEROBIC BOTTLE ONLY CRITICAL RESULT CALLED TO, READ BACK BY AND VERIFIED WITH: PREVIOUSLY CALLED TO MATT MCBANE AND ROBYN THOMAS AT 0122 ON 07/15/15 [facility] CONFIRMED BY [facility]    Culture (A)  Final    CLOSTRIDIUM PERFRINGENS ANAEROBIC BOTTLE ONLY BETA LACTAMASE POSITIVE    Report Status PENDING  Incomplete  Blood Culture (routine x 2)     Status: Abnormal (Preliminary result)   Collection  Time: 07/14/15 12:19 PM  Result Value Ref Range Status   Specimen Description BLOOD LEFT FOREARM  Final   Special Requests BOTTLES DRAWN  AEROBIC AND ANAEROBIC 1 CC  Final   Culture  Setup Time   Final    GRAM VARIABLE ROD ANAEROBIC BOTTLE ONLY CRITICAL RESULT CALLED TO, READ BACK BY AND VERIFIED WITH: MATT MCBANE AND ROBYN THOMAS AT 0122 ON 07/15/15 RWW CONFIRMED BY [facility]    Culture (A)  Final    CLOSTRIDIUM PERFRINGENS ANAEROBIC BOTTLE ONLY BETA LACTAMASE POSITIVE    Report Status PENDING  Incomplete  Blood Culture ID Panel (Reflexed)     Status: None   Collection Time: 07/14/15 12:19 PM  Result Value Ref Range Status   Enterococcus species NOT DETECTED NOT DETECTED Final   Vancomycin resistance NOT DETECTED NOT DETECTED Final   Listeria monocytogenes NOT DETECTED NOT DETECTED Final   Staphylococcus species NOT DETECTED NOT DETECTED Final   Staphylococcus aureus NOT DETECTED NOT DETECTED Final   Methicillin resistance NOT DETECTED NOT DETECTED Final   Streptococcus species NOT DETECTED NOT DETECTED Final   Streptococcus agalactiae NOT DETECTED NOT DETECTED Final   Streptococcus pneumoniae NOT DETECTED NOT DETECTED Final   Streptococcus pyogenes NOT DETECTED NOT DETECTED Final   Acinetobacter baumannii NOT DETECTED NOT DETECTED Final   Enterobacteriaceae species NOT DETECTED NOT DETECTED Final   Enterobacter cloacae complex NOT DETECTED NOT DETECTED Final   Escherichia coli NOT DETECTED NOT DETECTED Final   Klebsiella oxytoca NOT DETECTED NOT DETECTED Final   Klebsiella pneumoniae NOT DETECTED NOT DETECTED Final   Proteus species NOT DETECTED NOT DETECTED Final   Serratia marcescens NOT DETECTED NOT DETECTED Final   Carbapenem resistance NOT DETECTED NOT DETECTED Final   Haemophilus influenzae NOT DETECTED NOT DETECTED Final   Neisseria meningitidis NOT DETECTED NOT DETECTED Final   Pseudomonas aeruginosa NOT DETECTED NOT DETECTED Final   Candida albicans NOT DETECTED NOT DETECTED Final   Candida glabrata NOT DETECTED NOT DETECTED Final   Candida krusei NOT DETECTED NOT DETECTED Final   Candida parapsilosis NOT  DETECTED NOT DETECTED Final   Candida tropicalis NOT DETECTED NOT DETECTED Final    Radiology Reports Ct Abdomen Pelvis W Contrast  07/14/2015  CLINICAL DATA:  C/o abd pain and cramping x 2 days. Pt reports nausea and diarrhea yesterday. Denies vomiting. 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 EXAM: CT ABDOMEN AND PELVIS WITH CONTRAST TECHNIQUE: Multidetector CT imaging of the abdomen and pelvis was performed using the standard protocol following bolus administration of intravenous contrast. CONTRAST:  114mL ISOVUE-300 IOPAMIDOL (ISOVUE-300) INJECTION 61% COMPARISON:  08/18/2014 FINDINGS: Lung bases: Minor subsegmental atelectasis and/ or scarring. Heart normal size. Few mildly prominent, but nonenlarged, lymph nodes adjacent to the inferior descending thoracic aorta similar to the prior exam. Hepatobiliary: Fatty infiltration of the liver. No liver mass or focal lesion. Small dependent gallstones. No gallbladder wall thickening. There is enhancement along the wall of the common bile duct. Mild hazy opacity is noted in the fat along the porta hepatis. These findings are new from the prior exam. There is no bile duct dilation. Spleen, pancreas, adrenal glands:  Unremarkable. Kidneys, ureters, bladder: 5 mm low-density lesion in the upper pole the right kidney consistent with a cyst, stable. No other renal masses or lesions, no stones and no hydronephrosis. Normal ureters. Bladder is unremarkable. Lymph nodes:  No pathologically enlarged lymph nodes. Ascites:  None. Gastrointestinal: Scattered left colon diverticula. No diverticulitis. Stomach is unremarkable. Normal small bowel.  There is some hazy opacity at the root of the small bowel mesenteries similar to the prior exam. Normal appendix is visualized. Vascular: There are scattered aortic atherosclerotic calcifications with more dense calcification along splenic artery. No aneurysm. Musculoskeletal:  No osteoblastic or osteolytic lesions. IMPRESSION: 1. Mild  enhancement of the wall of the common bile duct with mild adjacent hazy inflammatory type change in the porta hepatis fat. Consider cholangitis in the proper clinical setting. There is no evidence of acute cholecystitis. Small gallstone is evident the gallbladder fundus. 2. There is some focal opacity at the root of the small bowel mesentery, which is similar to prior exam 3. Hepatic steatosis. 4. Left colon diverticula.  No diverticulitis. Electronically Signed   By: Lajean Manes M.D.   On: 07/14/2015 14:12     CBC  Recent Labs Lab 07/14/15 1039 07/15/15 0544 07/17/15 1234  WBC 11.7* 15.9* 8.4  HGB 13.2 12.4* 12.2*  HCT 39.1* 35.6* 36.3*  PLT 185 204 194  MCV 87.4 88.2 89.3  MCH 29.6 30.8 30.1  MCHC 33.8 34.9 33.7  RDW 13.1 14.1 14.1  LYMPHSABS 0.2*  --  1.4  MONOABS 0.4  --  0.5  EOSABS 0.0  --  0.2  BASOSABS 0.0  --  0.0    Chemistries   Recent Labs Lab 07/14/15 1039 07/15/15 0544 07/17/15 1234 07/18/15 0431  NA 134* 137 140 140  K 4.5 4.4 4.2 3.7  CL 100* 111 114* 113*  CO2 21* 21* 21* 21*  GLUCOSE 351* 73 122* 113*  BUN 10 17 15 12   CREATININE 1.03 0.96 0.80 0.79  CALCIUM 9.1 8.0* 7.9* 8.0*  AST 545* 223* 82* 64*  ALT 466* 269* 148* 120*  ALKPHOS 133* 101 214* 200*  BILITOT 4.4* 3.4* 1.9* 1.7*   ------------------------------------------------------------------------------------------------------------------ estimated creatinine clearance is 78.4 mL/min (by C-G formula based on Cr of 0.79). ------------------------------------------------------------------------------------------------------------------ No results for input(s): HGBA1C in the last 72 hours. ------------------------------------------------------------------------------------------------------------------ No results for input(s): CHOL, HDL, LDLCALC, TRIG, CHOLHDL, LDLDIRECT in the last 72  hours. ------------------------------------------------------------------------------------------------------------------ No results for input(s): TSH, T4TOTAL, T3FREE, THYROIDAB in the last 72 hours.  Invalid input(s): FREET3 ------------------------------------------------------------------------------------------------------------------ No results for input(s): VITAMINB12, FOLATE, FERRITIN, TIBC, IRON, RETICCTPCT in the last 72 hours.  Coagulation profile  Recent Labs Lab 07/15/15 0544  INR 1.16    No results for input(s): DDIMER in the last 72 hours.  Cardiac Enzymes  Recent Labs Lab 07/14/15 1039 07/14/15 1304 07/18/15 1200  TROPONINI 0.03 0.04* 0.10*   ------------------------------------------------------------------------------------------------------------------ Invalid input(s): POCBNP    Assessment & Plan   * Shortness of breath: I will obtain a EKG now cardiac enzymes and a chest x-ray  * Atrial fibrillation which is new onset I will increase his Toprol dose.  * Acute cholangitis with sepsis Blood culture with Clostridium perfringens,  Continue augmetin Blood culture showing Clostridium Status post spinctercomy  *Gastric stenosis status post dilation  *Duodenal ulceration Protonix twice a day  * Diabetes mellitus with hypoglycemia  bg stable  *Elevated troponin due to demand ischemia no chest pain  * Hypertension Continue patient's metoprolol and ramipril from home. Continue Lopressor IV when necessary.  * Chronic congestive heart failure. Unknown diastolic or systolic. Currently compensated monitor fluid status  * DVT prophylaxis with Lovenox.     Code Status Orders        Start     Ordered   07/14/15 1437  Full code   Continuous     07/14/15 1437  Code Status History    Date Active Date Inactive Code Status Order ID Comments User Context   08/18/2014  6:45 PM 08/23/2014  7:13 PM Full Code [id number]  Marlyce Huge, MD ED     Advance Directive Documentation        Most Recent Value   Type of Advance Directive  Healthcare Power of Attorney   Pre-existing out of facility DNR order (yellow form or pink MOST form)     "MOST" Form in Place?             ConsultsGI surgery DVT Prophylaxis  Lovenox   Lab Results  Component Value Date   PLT 194 07/17/2015     Time Spent in minutes   22min  Greater than 50% of time spent in care coordination and counseling patient regarding the condition and plan of care.   Dustin Flock M.D on 07/18/2015 at 2:06 PM  Between 7am to 6pm - Pager - [phone]  After 6pm go to [email or web address] - password [email or web address] [facility]   Office  [phone]

## 2015-07-18 NOTE — Progress Notes (Signed)
Dr. Posey Pronto on unit, made him aware of pt's troponin of 0.10.  No new orders at this time.  Clarise Cruz, RN

## 2015-07-18 NOTE — Care Management Important Message (Signed)
Important Message  Patient Details  Name: Derl Gregorius MRN: [id number] Date of Birth: 1927/05/30   Medicare Important Message Given:  Yes    [name] A Arantza Darrington 07/18/2015, 11:17 AM

## 2015-07-18 NOTE — Progress Notes (Signed)
STAT EKG done, given to Andee Poles, [employer]

## 2015-07-18 NOTE — Progress Notes (Signed)
Pt up to the bathroom with assistance from nurse tech.  Pt c/o feeling hot, sweating, and feeling SOB.  Dr. Posey Pronto contacted and cardiac monitor placed on pt and EKG 12 lead ordered.  Clarise Cruz, RN

## 2015-07-18 NOTE — Consult Note (Signed)
Cardiology Consultation Note  Patient ID: Joseph Flowers, MRN: [id number], DOB/AGE: 1927/07/10 82 y.o. Admit date: 07/14/2015   Date of Consult: 07/18/2015 Primary Physician: Leonel Ramsay, MD Primary Cardiologist: New to [facility] (previously followed by Serafina Royals, MD though not in many years) Requesting Physician: S. Posey Pronto, MD  Chief Complaint: Abdominal pain found to have cholangitis with sepsis and bacteremia with C. perfringens Reason for Consult: New onset Afib  HPI: 82 y.o. male with h/o CAD s/p 5 vessel CABG in 2001 in the setting of an MI, likely diastolic CHF (no prior echo), DM, HTN, HLD, and lymphoma who presented to [facility] on 5/20 with abdominal pain and was found to have acute cholangitis. Cardiology is consulted for new onset Afib.   Patient underwent 5 vessel cardiac bypass surgery in 2001 by Dr. Roxy Manns, MD. He was previously followed by Serafina Royals, MD, though has not seen him in many years. No notes in [facility]. Patient does report having a nuclear stress test many years ago s/p bypass from Dr. Nehemiah Massed that he reports being normal. No records for review at this time. Patient denies ever being told he has a history of Afib and has never been on full-dose anticoagulation.   Patient presented on 5/20 with a 2 day history of RUQ and epigastric pain with associated nausea and diarrhea. CT showed cholangitis with stone in the gallbladder fundus. No cholecystitis on CT scan. He was seen by GI and underwent ERCP on 5/22 that showed LA grade B esophagitis, gastric stenosis at the pylorus, multiple non-bleeding duodenal ulcers with no stigmata of bleeding. A biliary sphincterotomy was performed with purulence and sludge being seen in the biliary tree. Procedure was complicated by SOB post-procedure that did not require intervention. He has also been seen by general surgery with no plans for being taken to the OR at this time. He has subsequently had blood cultures come  back growing Clostridium perfringens on 5/23 x 2 vials and has been started on Augmentin. Troponin upon admission was 0.03 x 2-->0.04-->0.10 felt to be secondary to demand ischemia. WBC count has improved from a peak of 15.9-->8.4 on 5.23. LFTs have improved with AST trending from a peak of 545 to 82 on 5/23, ALT with a peak of 466-->148 on 5/23, alk phos 133-->214 on 5/23, total bilirubin 4.4-->1.9. He has remained afebrile and with a stable blood pressure. It is not exactly clear when he developed Afib as he has not been on telemetry this admission until this morning when it was reported he had an irregular HR. However, patient was symptomatic with increased SOB on 5/22 s/p ERCP as above, and developed acute SOB on the evening of 5/23. No interventions were done either time. Patient's heart rate ahs been in the 80's to 1-teens while in Afib this morning. He is much more SOB today than earlier in his admission. He was receiving IV fluids up until 5/23. TSH is pending. Last K+ of 4.2 on 5/23 with noted hemolysis, Mg++ pending. Echo pending. Repeat CXR pending. This morning priro to cardiology seeing the patient his Toprol XL was increased from 25 mg daily to 25 mg bid. He currently is in rate-controlled Afib.     Past Medical History  Diagnosis Date  . Diabetes mellitus without complication ([place])   . Hypertension   . Hyperlipemia   . 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 Diastolic CHF ([place]) 8592  . Coronary artery disease involving native  heart without angina pectoris 2002    s/p CABG x 5 (Dr. Roxy Manns  . S/P CABG x 5 2002    Dr. Roxy Manns  . Atrial fibrillation ([place])     a. first diagnosed 06/2015; b. CHADS2VASc => 6 (CHF, HTN, age x 2, DM, vascualr disease)      Most Recent Cardiac Studies: Echo pending    Surgical History:  Past Surgical History  Procedure Laterality Date  . Coronary artery bypass graft    . Endoscopic retrograde cholangiopancreatography (ercp) with propofol N/A 07/16/2015     Procedure: ENDOSCOPIC RETROGRADE CHOLANGIOPANCREATOGRAPHY (ERCP) WITH PROPOFOL;  Surgeon: Lucilla Lame, MD;  Location: [facility] ENDOSCOPY;  Service: Endoscopy;  Laterality: N/A;     Home Meds: Prior to Admission medications   Medication Sig Start Date End Date Taking? Authorizing Provider  aspirin 325 MG tablet Take 162.5 mg by mouth 2 (two) times daily.    Yes Historical Provider, MD  finasteride (PROSCAR) 5 MG tablet Take 5 mg by mouth daily.  06/12/14  Yes Historical Provider, MD  fluorouracil (EFUDEX) 5 % cream Apply 1 application topically 2 (two) times daily. Apply on right cheek for 3 to 4 weeks. 06/27/15 06/26/16 Yes Historical Provider, MD  furosemide (LASIX) 20 MG tablet Take 20 mg by mouth daily.  08/11/14  Yes Historical Provider, MD  glimepiride (AMARYL) 4 MG tablet Take 4 mg by mouth daily with breakfast. 06/04/15  Yes Historical Provider, MD  metFORMIN (GLUCOPHAGE) 500 MG tablet Take 500 mg by mouth daily with breakfast.  08/11/14  Yes Historical Provider, MD  metoprolol succinate (TOPROL-XL) 50 MG 24 hr tablet Take 25 mg by mouth 2 (two) times daily.  08/11/14  Yes Historical Provider, MD  ramipril (ALTACE) 10 MG capsule Take 10 mg by mouth daily.  08/11/14  Yes Historical Provider, MD  simvastatin (ZOCOR) 20 MG tablet Take 20 mg by mouth at bedtime.  08/11/14  Yes Historical Provider, MD    Inpatient Medications:  . amoxicillin-clavulanate  1 tablet Oral Q12H  . antiseptic oral rinse  7 mL Mouth Rinse q12n4p  . chlorhexidine  15 mL Mouth Rinse BID  . enoxaparin (LOVENOX) injection  40 mg Subcutaneous Q24H  . finasteride  5 mg Oral Daily  . fluorouracil  1 application Topical BID  . furosemide  40 mg Intravenous BID  . furosemide  40 mg Intravenous Once  . insulin aspart  0-15 Units Subcutaneous Q4H  . ketotifen  1 drop Both Eyes BID  . metoprolol succinate  50 mg Oral BID  .  morphine injection  4 mg Intravenous Once  . ondansetron (ZOFRAN) IV  4 mg Intravenous Once  . pantoprazole   40 mg Oral BID AC  . potassium chloride  20 mEq Oral BID  . ramipril  10 mg Oral Daily      Allergies: No Known Allergies  Social History   Social History  . Marital Status: Married    Spouse Name: N/A  . Number of Children: N/A  . Years of Education: N/A   Occupational History  . Not on file.   Social History Main Topics  . Smoking status: Never Smoker   . Smokeless tobacco: Never Used  . Alcohol Use: No  . Drug Use: No  . Sexual Activity: Not on file   Other Topics Concern  . Not on file   Social History Narrative     Family History  Problem Relation Age of Onset  . Cancer Mother   .  Cancer Father      Review of Systems: Review of Systems  Constitutional: Positive for weight loss and malaise/fatigue. Negative for fever, chills and diaphoresis.  HENT: Negative for congestion.   Eyes: Negative for discharge and redness.  Respiratory: Positive for cough, shortness of breath and wheezing. Negative for hemoptysis and sputum production.   Cardiovascular: Positive for leg swelling. Negative for chest pain, palpitations, orthopnea, claudication and PND.  Gastrointestinal: Negative for heartburn, nausea, vomiting and abdominal pain.  Musculoskeletal: Negative for myalgias and falls.  Skin: Negative for rash.  Neurological: Positive for dizziness. Negative for sensory change, speech change and loss of consciousness.  Endo/Heme/Allergies: Does not bruise/bleed easily.  Psychiatric/Behavioral: The patient is not nervous/anxious.     Labs:  Recent Labs  07/18/15 1200  TROPONINI 0.10*   Lab Results  Component Value Date   WBC 8.4 07/17/2015   HGB 12.2* 07/17/2015   HCT 36.3* 07/17/2015   MCV 89.3 07/17/2015   PLT 194 07/17/2015     Recent Labs Lab 07/18/15 0431  NA 140  K 3.7  CL 113*  CO2 21*  BUN 12  CREATININE 0.79  CALCIUM 8.0*  PROT 5.9*  BILITOT 1.7*  ALKPHOS 200*  ALT 120*  AST 64*  GLUCOSE 113*   No results found for: CHOL, HDL,  LDLCALC, TRIG No results found for: DDIMER  Radiology/Studies:  Ct Abdomen Pelvis W Contrast  07/14/2015  CLINICAL DATA:  C/o abd pain and cramping x 2 days. Pt reports nausea and diarrhea yesterday. Denies vomiting. 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 EXAM: CT ABDOMEN AND PELVIS WITH CONTRAST TECHNIQUE: Multidetector CT imaging of the abdomen and pelvis was performed using the standard protocol following bolus administration of intravenous contrast. CONTRAST:  161m ISOVUE-300 IOPAMIDOL (ISOVUE-300) INJECTION 61% COMPARISON:  08/18/2014 FINDINGS: Lung bases: Minor subsegmental atelectasis and/ or scarring. Heart normal size. Few mildly prominent, but nonenlarged, lymph nodes adjacent to the inferior descending thoracic aorta similar to the prior exam. Hepatobiliary: Fatty infiltration of the liver. No liver mass or focal lesion. Small dependent gallstones. No gallbladder wall thickening. There is enhancement along the wall of the common bile duct. Mild hazy opacity is noted in the fat along the porta hepatis. These findings are new from the prior exam. There is no bile duct dilation. Spleen, pancreas, adrenal glands:  Unremarkable. Kidneys, ureters, bladder: 5 mm low-density lesion in the upper pole the right kidney consistent with a cyst, stable. No other renal masses or lesions, no stones and no hydronephrosis. Normal ureters. Bladder is unremarkable. Lymph nodes:  No pathologically enlarged lymph nodes. Ascites:  None. Gastrointestinal: Scattered left colon diverticula. No diverticulitis. Stomach is unremarkable. Normal small bowel. There is some hazy opacity at the root of the small bowel mesenteries similar to the prior exam. Normal appendix is visualized. Vascular: There are scattered aortic atherosclerotic calcifications with more dense calcification along splenic artery. No aneurysm. Musculoskeletal:  No osteoblastic or osteolytic lesions. IMPRESSION: 1. Mild enhancement of the wall of the common  bile duct with mild adjacent hazy inflammatory type change in the porta hepatis fat. Consider cholangitis in the proper clinical setting. There is no evidence of acute cholecystitis. Small gallstone is evident the gallbladder fundus. 2. There is some focal opacity at the root of the small bowel mesentery, which is similar to prior exam 3. Hepatic steatosis. 4. Left colon diverticula.  No diverticulitis. Electronically Signed   By: DLajean ManesM.D.   On: 07/14/2015 14:12    EKG:  5/20 - sinus tachycardia, 103 bpm, RBBB. 5/24 - Afib, 87 bpm, RBBB  Weights: Filed Weights   07/16/15 0450 07/17/15 0500 07/18/15 0500  Weight: 206 lb 7 oz (93.639 kg) 212 lb (96.163 kg) 211 lb 4.8 oz (95.845 kg)     Physical Exam: Blood pressure 133/46, pulse 92, temperature 97.8 F (36.6 C), temperature source Oral, resp. rate 18, height 5' 10"  (1.778 m), weight 211 lb 4.8 oz (95.845 kg), SpO2 95 %. Body mass index is 30.32 kg/(m^2). General: Well developed, well nourished, in no acute distress. Head: Normocephalic, atraumatic, sclera non-icteric, no xanthomas, nares are without discharge.  Neck: Negative for carotid bruits. JVD elevated. Lungs: Diffuse, bilateral crackles. Breathing is unlabored. Heart: Irregularly-irregular, with S1 S2. II/VI systolic murmur, no rubs, or gallops appreciated. Abdomen: Soft, non-tender, non-distended with normoactive bowel sounds. No hepatomegaly. No rebound/guarding. No obvious abdominal masses. Msk:  Strength and tone appear normal for age. Extremities: No clubbing or cyanosis. 1+ pitting edema along bilateral LE, left > right.  Distal pedal pulses are 2+ and equal bilaterally. Neuro: Alert and oriented X 3. No facial asymmetry. No focal deficit. Moves all extremities spontaneously. Psych:  Responds to questions appropriately with a normal affect.    Assessment and Plan:   1. Afib: -Uncertain duration and chronicity. He was in sinus tachycardia upon his admission on 5/20  upon review of 12-lead EKG. However, it is impossible to determine if this is the patient's first episode of Afib vs just the first time it has been seen on telemetry and 12-lead EKG. -That being said, in the acute infectious setting with the patient dealing with sepsis and bacteremia in the setting of acute cholangitis and not being on long term, full-dose anticoagulation would recommend rate-control at this time.  -He is unlikely to maintain sinus rhythm in the acute setting.  -Continue Toprol XL 25 mg bid at this time. Heart rate is well controlled.  -As far as anticoagulation goes, he would be a candidate for long term, full-dose anticoagulation given his elevated CHADS2VASc score of at least 6 (CHF, HTN, age x 2, DM, vascular disease). However, given the acute abdominal complaint with seen multiple ulcers on ERCP we recommend both GI and general surgery to agree that it is safe to start full-dose anticoagulation prior to starting. Until that time he will be kept on full-dose aspirin, which is ineffective at stroke prevention.  -Current episode of Afib, whether new onset or not, is likely exacerbated by his acute infection as above as well as significant volume overload that requires aggressive IV diuresis.   2. Elevated troponin: -Initial troponin of 0.03-->0.04, now 0.10. -Likely in the setting of demand ischemia 2/2 sepsis and bacteremia in the setting of acute cholangitis and volume overload.  -No angina.  -He will need an ischemic evaluation in the future, though in the acute setting of his above illness/infection as well as him being significantly volume overloaded, now is not the time.  -Check echo to evaluate LV systolic function and wall motion.  3. Acute on chronic diastolic CHF: -Significantly volume overloaded.  -Repeat CXR today pending.  -Aggressive IV diuresis with IV Lasix 40 mg bid with KCl repletion.  -Hold IV fluids.  -Toprol XL as above.  -Check echo to evaluate LVSF, wall  motion, myocardium thickness  4. CAD s/p 5 vessel CABG in 2001: -Patient reported nuclear stress test many years ago s/p bypass. No records available for review.  -No angina.  -Will need ischemic evaluation as above.  -  Toprol XL as above.  -Aspirin as above.  -Once he starts full-dose anticoagulation would discontinue aspirin and use only full-dose anticoagulation.   5. Sepsis and bacteremia in the setting of acute cholangitis: -Status post ERCP -On Augmentin per IM -Per IM  6. HTN: -Controlled -Toprol XL increased as above  7. HLD: -Statin  8. Deconditioning: -Patient has not been out of bed to ambulate besides going to the restroom since his admission. -PT was cancelled today given #1 -With controlled heart rate now, recommend PT/RN to ambulate patient   9. DM: -SSI per IM  10. Dispo: -May require SNF    Signed, Marcille Blanco Pager: [phone] 07/18/2015, 2:10 PM

## 2015-07-18 NOTE — Progress Notes (Signed)
CC: Cholangitis Subjective: Patient continues to have no complaints of abdominal pain. He is now having cardiac and pulmonary issues.  Objective: Vital signs in last 24 hours: Temp:  [97.8 F (36.6 C)-98.4 F (36.9 C)] 97.8 F (36.6 C) (05/24 1422) Pulse Rate:  [85-104] 85 (05/24 1422) Resp:  [18] 18 (05/24 0552) BP: (133-161)/(46-87) 161/81 mmHg (05/24 1422) SpO2:  [91 %-95 %] 94 % (05/24 1422) Weight:  [95.845 kg (211 lb 4.8 oz)] 95.845 kg (211 lb 4.8 oz) (05/24 0500) Last BM Date: 07/17/15  Intake/Output from previous day: 05/23 0701 - 05/24 0700 In: 600 [P.O.:600] Out: 525 [Urine:525] Intake/Output this shift: Total I/O In: -  Out: 1125 [Urine:1125]  Physical exam:  Abdomen: Soft, nontender, nondistended  Lab Results: CBC   Recent Labs  07/17/15 1234  WBC 8.4  HGB 12.2*  HCT 36.3*  PLT 194   BMET  Recent Labs  07/17/15 1234 07/18/15 0431  NA 140 140  K 4.2 3.7  CL 114* 113*  CO2 21* 21*  GLUCOSE 122* 113*  BUN 15 12  CREATININE 0.80 0.79  CALCIUM 7.9* 8.0*   PT/INR No results for input(s): LABPROT, INR in the last 72 hours. ABG No results for input(s): PHART, HCO3 in the last 72 hours.  Invalid input(s): PCO2, PO2  Studies/Results: Dg Chest 1 View  07/18/2015  CLINICAL DATA:  Shortness of breath, history of lymphoma and congestive heart failure EXAM: CHEST 1 VIEW COMPARISON:  10/21/2014 FINDINGS: Stable mild cardiac enlargement. Status post CABG. Vascular pattern unchanged and within normal limits. No evidence of pulmonary edema. Mild bilateral costophrenic angle blunting. IMPRESSION: Stable cardiac enlargement with no definite evidence of pulmonary edema. Bilateral costophrenic angle blunting possibly indicating small pleural effusions. Electronically Signed   By: Skipper Cliche M.D.   On: 07/18/2015 14:41    Anti-infectives: Anti-infectives    Start     Dose/Rate Route Frequency Ordered Stop   07/17/15 1415  amoxicillin-clavulanate  (AUGMENTIN) 875-125 MG per tablet 1 tablet     1 tablet Oral Every 12 hours 07/17/15 1404     07/14/15 2200  piperacillin-tazobactam (ZOSYN) IVPB 3.375 g  Status:  Discontinued     3.375 g 12.5 mL/hr over 240 Minutes Intravenous Every 8 hours 07/14/15 1447 07/17/15 1404   07/14/15 1430  piperacillin-tazobactam (ZOSYN) IVPB 3.375 g     3.375 g 100 mL/hr over 30 Minutes Intravenous  Once 07/14/15 1417 07/14/15 1517      Assessment/Plan:  82 year old male with recent cholangitis status post ERCP. No current indications for further surgical intervention. Patient to follow-up in the surgery clinic 1-2 weeks after discharge for further discussion whether or not anything is required surgically for his gallbladder. Please call again if surgery can be of any further assistance.  Montia Haslip T. Adonis Huguenin, MD, FACS  07/18/2015

## 2015-07-18 NOTE — Progress Notes (Signed)
PT Hold Note  Patient Details Name: Joseph Flowers MRN: [id number] DOB: May 16, 1927   Cancelled Treatment:    Reason Eval/Treat Not Completed: Medical issues which prohibited therapy. Chart reviewed and RN consulted. RN reporting that pt just started with shortness of breath and irregular heart rate. MD paged and pt placed on off unit telemetry monitoring. Waiting for plan of care, not appropriate for PT evaluation currently. Will attempt PT evaluation at later date/time as pt is appropriate.   Lyndel Safe Huprich PT, DPT   Huprich,Jason 07/18/2015, 12:07 PM

## 2015-07-19 LAB — BASIC METABOLIC PANEL
ANION GAP: 4 — AB (ref 5–15)
BUN: 16 mg/dL (ref 6–20)
CO2: 25 mmol/L (ref 22–32)
Calcium: 8.4 mg/dL — ABNORMAL LOW (ref 8.9–10.3)
Chloride: 113 mmol/L — ABNORMAL HIGH (ref 101–111)
Creatinine, Ser: 0.79 mg/dL (ref 0.61–1.24)
GFR calc Af Amer: >60 mL/min (ref >60)
Glucose, Bld: 126 mg/dL — ABNORMAL HIGH (ref 65–99)
POTASSIUM: 3.9 mmol/L (ref 3.5–5.1)
SODIUM: 142 mmol/L (ref 135–145)

## 2015-07-19 LAB — CULTURE, BLOOD (ROUTINE X 2)

## 2015-07-19 LAB — GLUCOSE, CAPILLARY
GLUCOSE-CAPILLARY: 122 mg/dL — AB (ref 65–99)
GLUCOSE-CAPILLARY: 131 mg/dL — AB (ref 65–99)
GLUCOSE-CAPILLARY: 196 mg/dL — AB (ref 65–99)
GLUCOSE-CAPILLARY: 208 mg/dL — AB (ref 65–99)
Glucose-Capillary: 137 mg/dL — ABNORMAL HIGH (ref 65–99)
Glucose-Capillary: 195 mg/dL — ABNORMAL HIGH (ref 65–99)

## 2015-07-19 LAB — TROPONIN I: Troponin I: 0.11 ng/mL — ABNORMAL HIGH (ref <0.031)

## 2015-07-19 LAB — ECHOCARDIOGRAM COMPLETE
HEIGHTINCHES: 70 in
Weight: 3380.8 oz

## 2015-07-19 MED ORDER — ASPIRIN EC 325 MG PO TBEC
325.0000 mg | DELAYED_RELEASE_TABLET | Freq: Every day | ORAL | Status: DC
  Administered 2015-07-19 – 2015-07-20 (×2): 325 mg via ORAL
  Filled 2015-07-19 (×2): qty 1

## 2015-07-19 NOTE — Progress Notes (Signed)
Pt transport to [facility] via [place] for test.

## 2015-07-19 NOTE — Evaluation (Signed)
Physical Therapy Evaluation Patient Details Name: Joseph Flowers MRN: [id number] DOB: 1927-07-10 Today's Date: 07/19/2015   History of Present Illness  Joseph Flowers  is a 82 y.o.male with a known history of Diabetes, hypertension, CHF presented to the emergency room complaining of 2 days of right upper quadrant and epigastric area abdominal pain. Patient has had nausea but no vomiting. 3 episodes of loose stools. No blood. He has had lightheadedness and fatigue. Chills but no fever. In the emergency room patient had tachycardia and leukocytosis. CT scan showed cholangitis with stone in the gallbladder fundus. No cholecystitis on CAT scan. GI reports no surgical needs currently s/p ERCP. Yesterday pt started with new onset A-fib. Pt has also had elevated troponins in the setting of demand ischemia. Pt denies falls in the last 12 months. He is AOx4 at time of evaluation.   Clinical Impression  Pt demonstrates safe bed mobility, transfers, and ambulation. He is able to complete a full lap around RN station. Pt denies DOE and HR is between 102-110 during ambulation. Pt remains in a-fib throughout session. Pt able to complete seated exercises. His balance is somewhat impaired with LOB in narrow static stance. Pt encouraged to utilize rolling walker at discharge and he is steady and safe with assistive device during evaluation. Recommend HH PT but pt currently refusing. Pt will benefit from skilled PT services to address deficits in strength, balance, and mobility in order to return to full function at home.     Follow Up Recommendations Home health PT;Other (comment) (Pt declines HH PT)    Equipment Recommendations  None recommended by PT;Other (comment) (should use his rolling walker at discharge)    Recommendations for Other Services       Precautions / Restrictions Precautions Precautions: Fall Restrictions Weight Bearing Restrictions: No      Mobility  Bed Mobility Overal bed  mobility: Independent             General bed mobility comments: Good speed/sequencing noted  Transfers Overall transfer level: Needs assistance Equipment used: Rolling walker (2 wheeled) Transfers: Sit to/from Stand Sit to Stand: Supervision         General transfer comment: Pt demonstrates safe hand placement and good LE strength during transfer. Fair static standing balance in wide stance without UE support  Ambulation/Gait Ambulation/Gait assistance: Min guard Ambulation Distance (Feet): 220 Feet Assistive device: Rolling walker (2 wheeled) Gait Pattern/deviations: Decreased step length - right;Decreased step length - left Gait velocity: Decreased but functional for full household mobility Gait velocity interpretation: <1.8 ft/sec, indicative of risk for recurrent falls General Gait Details: Pt able to ambulate a full lap around RN station. Rolling walker utilized due to poor static standing balance in narrow stance. Pt demonstrates good stability with UE support. Vitals monitored throughout. SaO2>90% on room air throuhgout ambulation. HR remains 102-110. Pt denies chest pain or DOE.  Stairs            Wheelchair Mobility    Modified Rankin (Stroke Patients Only)       Balance Overall balance assessment: Needs assistance Sitting-balance support: No upper extremity supported Sitting balance-Leahy Scale: Good     Standing balance support: No upper extremity supported Standing balance-Leahy Scale: Fair Standing balance comment: Pt able to maintain wide stance without UE support. Able to achieve feet together stance but unable to maintain balance for >5 seconds. Further balance screening deferred. Pt encouraged to use rolling walker at discharge  Pertinent Vitals/Pain Pain Assessment: No/denies pain    Home Living Family/patient expects to be discharged to:: Private residence Living Arrangements: Alone Available Help  at Discharge: Family Type of Home: House Home Access: Stairs to enter Entrance Stairs-Rails: Psychiatric nurse of Steps: 6 Home Layout: One level Home Equipment: Other (comment);Walker - 2 wheels;Bedside commode (no shower seat)      Prior Function Level of Independence: Independent         Comments: Pt reports independent community ambulation without assistive device. Drives. Pt is independent with ADLs/IADLs     Hand Dominance   Dominant Hand: Right    Extremity/Trunk Assessment   Upper Extremity Assessment: Overall WFL for tasks assessed           Lower Extremity Assessment: Overall WFL for tasks assessed         Communication   Communication: No difficulties  Cognition Arousal/Alertness: Awake/alert Behavior During Therapy: WFL for tasks assessed/performed Overall Cognitive Status: Within Functional Limits for tasks assessed                      General Comments      Exercises General Exercises - Lower Extremity Long Arc Quad: Strengthening;Both;10 reps;Seated Heel Slides: Right;Both;10 reps;Seated Hip ABduction/ADduction: Strengthening;Both;10 reps;Seated Hip Flexion/Marching: Strengthening;Both;10 reps;Seated      Assessment/Plan    PT Assessment Patient needs continued PT services  PT Diagnosis Abnormality of gait;Difficulty walking   PT Problem List Decreased strength;Decreased balance;Decreased mobility;Cardiopulmonary status limiting activity  PT Treatment Interventions DME instruction;Gait training;Stair training;Functional mobility training;Therapeutic activities;Therapeutic exercise;Balance training;Neuromuscular re-education;Patient/family education   PT Goals (Current goals can be found in the Care Plan section) Acute Rehab PT Goals Patient Stated Goal: Return to prior level of function at home PT Goal Formulation: With patient Time For Goal Achievement: 08/02/15 Potential to Achieve Goals: Good    Frequency  Min 2X/week   Barriers to discharge Decreased caregiver support Lives alone    Co-evaluation               End of Session Equipment Utilized During Treatment: Gait belt Activity Tolerance: Patient tolerated treatment well Patient left: in chair;with call bell/phone within reach;with chair alarm set Nurse Communication: Mobility status         Time: [id number] PT Time Calculation (min) (ACUTE ONLY): 29 min   Charges:   PT Evaluation $PT Eval Moderate Complexity: 1 Procedure PT Treatments $Therapeutic Exercise: 8-22 mins   PT G Codes:       Lyndel Safe Huprich PT, DPT   Huprich,Jason 07/19/2015, 12:26 PM

## 2015-07-19 NOTE — Progress Notes (Addendum)
Patient: Joseph Flowers / Admit Date: 07/14/2015 / Date of Encounter: 07/19/2015, 11:11 AM   Subjective: UOP of 1.6 L in the past 24 hours. No bmet this morning. Breathing better this AM. No chest pain or SOB.    Review of Systems: Review of Systems  Constitutional: Positive for weight loss and malaise/fatigue. Negative for fever, chills and diaphoresis.  HENT: Negative for congestion.   Eyes: Negative for discharge and redness.  Respiratory: Positive for cough, sputum production, shortness of breath and wheezing. Negative for hemoptysis.   Cardiovascular: Positive for orthopnea. Negative for chest pain, claudication, leg swelling and PND.  Gastrointestinal: Negative for nausea, vomiting, abdominal pain, diarrhea, constipation, blood in stool and melena.  Musculoskeletal: Negative for myalgias and falls.  Skin: Negative for rash.  Neurological: Positive for weakness. Negative for dizziness, sensory change, speech change, focal weakness and loss of consciousness.  Endo/Heme/Allergies: Does not bruise/bleed easily.  Psychiatric/Behavioral: The patient is not nervous/anxious.   All other systems reviewed and are negative.   Objective: Telemetry: Afib with heart rates ranging from the 70's to 120's. Currently in Afib with Heart rate in the mid 80's. Physical Exam: Blood pressure 138/75, pulse 86, temperature 97.5 F (36.4 C), temperature source Oral, resp. rate 19, height 5\' 10"  (1.778 m), weight 210 lb (95.255 kg), SpO2 94 %. Body mass index is 30.13 kg/(m^2). General: Well developed, well nourished, in no acute distress. Head: Normocephalic, atraumatic, sclera non-icteric, no xanthomas, nares are without discharge. Neck: Negative for carotid bruits. JVP elevated. Lungs:Improved crackles along bilateral lung bases, though crackles still present. Breathing is unlabored. Heart: Irregularly-irregular, S1 S2, II/VI systolic murmur, no rubs, or gallops.  Abdomen: Soft, non-tender,  non-distended with normoactive bowel sounds. No rebound/guarding. Extremities: No clubbing or cyanosis. No edema. Distal pedal pulses are 2+ and equal bilaterally. Neuro: Alert and oriented X 3. Moves all extremities spontaneously. Psych:  Responds to questions appropriately with a normal affect.   Intake/Output Summary (Last 24 hours) at 07/19/15 1111 Last data filed at 07/19/15 0900  Gross per 24 hour  Intake    480 ml  Output   2525 ml  Net  -2045 ml    Inpatient Medications:  . amoxicillin-clavulanate  1 tablet Oral Q12H  . antiseptic oral rinse  7 mL Mouth Rinse q12n4p  . chlorhexidine  15 mL Mouth Rinse BID  . enoxaparin (LOVENOX) injection  40 mg Subcutaneous Q24H  . finasteride  5 mg Oral Daily  . fluorouracil  1 application Topical BID  . furosemide  40 mg Intravenous BID  . insulin aspart  0-15 Units Subcutaneous Q4H  . ketotifen  1 drop Both Eyes BID  . metoprolol succinate  50 mg Oral BID  .  morphine injection  4 mg Intravenous Once  . ondansetron (ZOFRAN) IV  4 mg Intravenous Once  . pantoprazole  40 mg Oral BID AC  . potassium chloride  20 mEq Oral BID  . ramipril  10 mg Oral Daily   Infusions:    Labs:  Recent Labs  07/17/15 1234 07/18/15 0431 07/18/15 1200  NA 140 140  --   K 4.2 3.7  --   CL 114* 113*  --   CO2 21* 21*  --   GLUCOSE 122* 113*  --   BUN 15 12  --   CREATININE 0.80 0.79  --   CALCIUM 7.9* 8.0*  --   MG  --   --  2.1    Recent Labs  07/17/15 1234 07/18/15 0431  AST 82* 64*  ALT 148* 120*  ALKPHOS 214* 200*  BILITOT 1.9* 1.7*  PROT 5.4* 5.9*  ALBUMIN 2.5* 2.4*    Recent Labs  07/17/15 1234  WBC 8.4  NEUTROABS 6.4  HGB 12.2*  HCT 36.3*  MCV 89.3  PLT 194    Recent Labs  07/18/15 1200 07/18/15 1941 07/19/15 0326  TROPONINI 0.10* 0.12* 0.11*   Invalid input(s): POCBNP No results for input(s): HGBA1C in the last 72 hours.   Weights: Filed Weights   07/17/15 0500 07/18/15 0500 07/19/15 0544  Weight: 212  lb (96.163 kg) 211 lb 4.8 oz (95.845 kg) 210 lb (95.255 kg)     Radiology/Studies:  Dg Chest 1 View  07/18/2015  CLINICAL DATA:  Shortness of breath, history of lymphoma and congestive heart failure EXAM: CHEST 1 VIEW COMPARISON:  10/21/2014 FINDINGS: Stable mild cardiac enlargement. Status post CABG. Vascular pattern unchanged and within normal limits. No evidence of pulmonary edema. Mild bilateral costophrenic angle blunting. IMPRESSION: Stable cardiac enlargement with no definite evidence of pulmonary edema. Bilateral costophrenic angle blunting possibly indicating small pleural effusions. Electronically Signed   By: Skipper Cliche M.D.   On: 07/18/2015 14:41   Ct Abdomen Pelvis W Contrast  07/14/2015  CLINICAL DATA:  C/o abd pain and cramping x 2 days. Pt reports nausea and diarrhea yesterday. Denies vomiting. 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 EXAM: CT ABDOMEN AND PELVIS WITH CONTRAST TECHNIQUE: Multidetector CT imaging of the abdomen and pelvis was performed using the standard protocol following bolus administration of intravenous contrast. CONTRAST:  188mL ISOVUE-300 IOPAMIDOL (ISOVUE-300) INJECTION 61% COMPARISON:  08/18/2014 FINDINGS: Lung bases: Minor subsegmental atelectasis and/ or scarring. Heart normal size. Few mildly prominent, but nonenlarged, lymph nodes adjacent to the inferior descending thoracic aorta similar to the prior exam. Hepatobiliary: Fatty infiltration of the liver. No liver mass or focal lesion. Small dependent gallstones. No gallbladder wall thickening. There is enhancement along the wall of the common bile duct. Mild hazy opacity is noted in the fat along the porta hepatis. These findings are new from the prior exam. There is no bile duct dilation. Spleen, pancreas, adrenal glands:  Unremarkable. Kidneys, ureters, bladder: 5 mm low-density lesion in the upper pole the right kidney consistent with a cyst, stable. No other renal masses or lesions, no stones and no  hydronephrosis. Normal ureters. Bladder is unremarkable. Lymph nodes:  No pathologically enlarged lymph nodes. Ascites:  None. Gastrointestinal: Scattered left colon diverticula. No diverticulitis. Stomach is unremarkable. Normal small bowel. There is some hazy opacity at the root of the small bowel mesenteries similar to the prior exam. Normal appendix is visualized. Vascular: There are scattered aortic atherosclerotic calcifications with more dense calcification along splenic artery. No aneurysm. Musculoskeletal:  No osteoblastic or osteolytic lesions. IMPRESSION: 1. Mild enhancement of the wall of the common bile duct with mild adjacent hazy inflammatory type change in the porta hepatis fat. Consider cholangitis in the proper clinical setting. There is no evidence of acute cholecystitis. Small gallstone is evident the gallbladder fundus. 2. There is some focal opacity at the root of the small bowel mesentery, which is similar to prior exam 3. Hepatic steatosis. 4. Left colon diverticula.  No diverticulitis. Electronically Signed   By: Lajean Manes M.D.   On: 07/14/2015 14:12     Assessment and Plan  Active Problems:   Cholangitis   Diastolic CHF ([facility])   Calculus of gallbladder with biliary obstruction but without cholecystitis  Postpyloric ulcer   Atrial fibrillation ([facility])   Coronary artery disease involving native heart without angina pectoris   S/P CABG x 5   Esophagitis, unspecified   Acquired hypertrophic pyloric stenosis    1. Afib: -Uncertain duration and chronicity. He was in sinus tachycardia upon his admission on 5/20 upon review of 12-lead EKG. However, it is impossible to determine if this is the patient's first episode of Afib vs just the first time it has been seen on telemetry and 12-lead EKG. -That being said, in the acute infectious setting with the patient dealing with sepsis and bacteremia in the setting of acute cholangitis and not being on long term, full-dose  anticoagulation would recommend rate-control at this time.  -He is unlikely to maintain sinus rhythm in the acute setting.  -Continue Toprol XL 25 mg bid at this time. Heart rate is well controlled.  -As far as anticoagulation goes, he would be a candidate for long term, full-dose anticoagulation given his elevated CHADS2VASc score of at least 6 (CHF, HTN, age x 2, DM, vascular disease). However, given the acute abdominal complaint with seen multiple ulcers on ERCP we recommend both GI and general surgery to agree that it is safe to start full-dose anticoagulation prior to starting. Until that time he will be kept on full-dose aspirin, which is ineffective at stroke prevention.  -Current episode of Afib, whether new onset or not, is likely exacerbated by his acute infection as above as well as significant volume overload that requires aggressive IV diuresis.  -His Afib with likely exacerbating #3  2. Elevated troponin: -Initial troponin of 0.03-->0.04, 0.10-->0.12-->0.11. -Likely in the setting of demand ischemia 2/2 sepsis and bacteremia in the setting of acute cholangitis and volume overload.  -No angina.  -He will need an ischemic evaluation in the future, though in the acute setting of his above illness/infection as well as him being significantly volume overloaded, now is not the time.  -Echo showed an EF of 50-55%, mild HK of the mid to apical anterior and anteroseptal myocardium, not technically sufficient to allow for lv diastolic function, mild AS, LA mildly dilated at 45 mm, PASp 45 mm Hg.  3. Acute on chronic diastolic CHF: -Significantly volume overloaded.  -Repeat CXR on 5/24 with small pleural effusions bilaterally.  -Continue aggressive IV diuresis with IV Lasix 40 mg bid with KCl repletion, pending bmet today.  -Hold IV fluids.  -Toprol XL as above.  -Echo as above.  4. CAD s/p 5 vessel CABG in 2001: -Patient reported nuclear stress test many years ago s/p bypass. No  records available for review.  -No angina.  -Will need ischemic evaluation as above.  -Toprol XL as above.  -Aspirin as above.  -Once he starts full-dose anticoagulation would discontinue aspirin and use only full-dose anticoagulation.   5. Sepsis and bacteremia in the setting of acute cholangitis: -Status post ERCP -On Augmentin per IM -Per IM  6. HTN: -Controlled -Toprol XL increased as above  7. HLD: -Statin  8. Deconditioning: -Patient has not been out of bed to ambulate besides going to the restroom since his admission. -PT was cancelled today given #1 -With controlled heart rate now, recommend PT/RN to ambulate patient   9. DM: -SSI per IM  10. Dispo: -May require SNF  Signed, Marcille Blanco Pager: [phone] 07/19/2015, 11:11 AM    Attending Note Patient seen and examined, agree with detailed note above,  Patient presentation and plan discussed on rounds.   Seen 07/20/2015  on morning rounds Appears comfortable, much less shortness of breath after receiving Lasix IV twice a day Converted back to normal sinus rhythm He does report having loose movements which she attributes to the Lasix He does take Lasix daily at home  Clinical exam is essentially benign, normal heart sounds, regular, lungs relatively clear, no significant leg edema (trace left extremity which he says is chronic)  Echocardiogram again reviewed with him, ejection fraction 50-55%, at least mild, mild to moderate aortic valve stenosis, heavily calcified valve, mildly elevated right heart pressures  Labwork stable, Normal renal function  --Would recommend outpatient follow-up in clinic Would resume prior outpatient medications   Greater than 50% was spent in counseling and coordination of care with patient Total encounter time 25 minutes or more   Signed: Esmond Plants  M.D., Ph.D. [facility]

## 2015-07-19 NOTE — Progress Notes (Signed)
[name] [facility]                                                                                                                                                                                            Patient Demographics   Joseph Flowers, is a 82 y.o. male, DOB - 1927/04/20, ID:[id number]  Admit date - 07/14/2015   Admitting Physician Hillary Bow, MD  Outpatient Primary MD for the patient is FITZGERALD, DAVID P, MD   LOS - 5  Subjective:feels little better, sob resolved, hr stable   Review of Systems:   CONSTITUTIONAL: No documented fever. No fatigue, weakness. No weight gain, no weight loss.  EYES: No blurry or double vision.  ENT: No tinnitus. No postnasal drip. No redness of the oropharynx.  RESPIRATORY: No cough, no wheeze, no hemoptysis. Positive dyspnea.  CARDIOVASCULAR: No chest pain. No orthopnea. No palpitations. No syncope.  GASTROINTESTINAL: No nausea, no vomiting or diarrhea. Positive abdominal pain. No melena or hematochezia.  GENITOURINARY: No dysuria or hematuria.  ENDOCRINE: No polyuria or nocturia. No heat or cold intolerance.  HEMATOLOGY: No anemia. No bruising. No bleeding.  INTEGUMENTARY: No rashes. No lesions.  MUSCULOSKELETAL: No arthritis. No swelling. No gout.  NEUROLOGIC: No numbness, tingling, or ataxia. No seizure-type activity.  PSYCHIATRIC: No anxiety. No insomnia. No ADD.    Vitals:   Filed Vitals:   07/18/15 2039 07/19/15 0544 07/19/15 0950 07/19/15 1337  BP: 146/67 165/71 138/75 137/57  Pulse: 80 100 86 86  Temp: 97.6 F (36.4 C) 97.5 F (36.4 C)  97.4 F (36.3 C)  TempSrc: Oral Oral  Oral  Resp: 19 19  20   Height:      Weight:  95.255 kg (210 lb)    SpO2: 96% 94%  96%    Wt Readings from Last 3 Encounters:  07/19/15 95.255 kg (210 lb)  10/06/14 91.6 kg (201 lb 15.1 oz)  08/30/14 92.987 kg (205 lb)     Intake/Output Summary (Last 24 hours) at 07/19/15 1411 Last data filed at  07/19/15 1300  Gross per 24 hour  Intake    720 ml  Output   3025 ml  Net  -2305 ml    Physical Exam:   GENERAL: Pleasant-appearing in no apparent distress.  HEAD, EYES, EARS, NOSE AND THROAT: Atraumatic, normocephalic. Extraocular muscles are intact. Pupils equal and reactive to light. Sclerae anicteric. No conjunctival injection. No oro-pharyngeal erythema.  NECK: Supple. There is no jugular venous distention. No bruits, no lymphadenopathy, no thyromegaly.  HEART: irrg irrg,. No murmurs, no rubs, no clicks.  LUNGS: Clear to auscultation bilaterally.  No rales or rhonchi. No wheezes.  ABDOMEN: Soft, flat,  tenderness on the right upper quadrant nondistended. Has good bowel sounds. No hepatosplenomegaly appreciated.  EXTREMITIES: No evidence of any cyanosis, clubbing, or peripheral edema.  +2 pedal and radial pulses bilaterally.  NEUROLOGIC: The patient is alert, awake, and oriented x3 with no focal motor or sensory deficits appreciated bilaterally.  SKIN: Moist and warm with no rashes appreciated.  Psych: Not anxious, depressed LN: No inguinal LN enlargement    Antibiotics   Anti-infectives    Start     Dose/Rate Route Frequency Ordered Stop   07/17/15 1415  amoxicillin-clavulanate (AUGMENTIN) 875-125 MG per tablet 1 tablet     1 tablet Oral Every 12 hours 07/17/15 1404     07/14/15 2200  piperacillin-tazobactam (ZOSYN) IVPB 3.375 g  Status:  Discontinued     3.375 g 12.5 mL/hr over 240 Minutes Intravenous Every 8 hours 07/14/15 1447 07/17/15 1404   07/14/15 1430  piperacillin-tazobactam (ZOSYN) IVPB 3.375 g     3.375 g 100 mL/hr over 30 Minutes Intravenous  Once 07/14/15 1417 07/14/15 1517      Medications   Scheduled Meds: . amoxicillin-clavulanate  1 tablet Oral Q12H  . antiseptic oral rinse  7 mL Mouth Rinse q12n4p  . chlorhexidine  15 mL Mouth Rinse BID  . enoxaparin (LOVENOX) injection  40 mg Subcutaneous Q24H  . finasteride  5 mg Oral Daily  . fluorouracil  1  application Topical BID  . furosemide  40 mg Intravenous BID  . insulin aspart  0-15 Units Subcutaneous Q4H  . ketotifen  1 drop Both Eyes BID  . metoprolol succinate  50 mg Oral BID  .  morphine injection  4 mg Intravenous Once  . ondansetron (ZOFRAN) IV  4 mg Intravenous Once  . pantoprazole  40 mg Oral BID AC  . potassium chloride  20 mEq Oral BID  . ramipril  10 mg Oral Daily   Continuous Infusions:   PRN Meds:.acetaminophen **OR** acetaminophen, albuterol, bisacodyl, HYDROcodone-acetaminophen, metoprolol, morphine injection, ondansetron **OR** ondansetron (ZOFRAN) IV, polyethylene glycol   Data Review:   Micro Results Recent Results (from the past 240 hour(s))  Urine culture     Status: None   Collection Time: 07/14/15 10:39 AM  Result Value Ref Range Status   Specimen Description URINE, RANDOM  Final   Special Requests NONE  Final   Culture NO GROWTH Performed at [facility]   Final   Report Status 07/15/2015 FINAL  Final  Blood Culture (routine x 2)     Status: Abnormal   Collection Time: 07/14/15 12:19 PM  Result Value Ref Range Status   Specimen Description BLOOD RIGHT ARM  Final   Special Requests BOTTLES DRAWN AEROBIC AND ANAEROBIC 1 CC  Final   Culture  Setup Time   Final    GRAM VARIABLE ROD ANAEROBIC BOTTLE ONLY CRITICAL RESULT CALLED TO, READ BACK BY AND VERIFIED WITH: PREVIOUSLY CALLED TO MATT MCBANE AND ROBYN THOMAS AT 0122 ON 07/15/15 [facility] CONFIRMED BY [facility]    Culture (A)  Final    CLOSTRIDIUM PERFRINGENS ANAEROBIC BOTTLE ONLY BETA LACTAMASE POSITIVE    Report Status 07/19/2015 FINAL  Final  Blood Culture (routine x 2)     Status: Abnormal   Collection Time: 07/14/15 12:19 PM  Result Value Ref Range Status   Specimen Description BLOOD LEFT FOREARM  Final   Special Requests BOTTLES DRAWN AEROBIC AND ANAEROBIC 1 CC  Final   Culture  Setup  Time   Final    GRAM VARIABLE ROD ANAEROBIC BOTTLE ONLY CRITICAL RESULT CALLED TO, READ BACK BY AND  VERIFIED WITH: MATT MCBANE AND ROBYN THOMAS AT 0122 ON 07/15/15 [facility] CONFIRMED BY [facility]    Culture (A)  Final    CLOSTRIDIUM PERFRINGENS ANAEROBIC BOTTLE ONLY BETA LACTAMASE POSITIVE    Report Status 07/19/2015 FINAL  Final  Blood Culture ID Panel (Reflexed)     Status: None   Collection Time: 07/14/15 12:19 PM  Result Value Ref Range Status   Enterococcus species NOT DETECTED NOT DETECTED Final   Vancomycin resistance NOT DETECTED NOT DETECTED Final   Listeria monocytogenes NOT DETECTED NOT DETECTED Final   Staphylococcus species NOT DETECTED NOT DETECTED Final   Staphylococcus aureus NOT DETECTED NOT DETECTED Final   Methicillin resistance NOT DETECTED NOT DETECTED Final   Streptococcus species NOT DETECTED NOT DETECTED Final   Streptococcus agalactiae NOT DETECTED NOT DETECTED Final   Streptococcus pneumoniae NOT DETECTED NOT DETECTED Final   Streptococcus pyogenes NOT DETECTED NOT DETECTED Final   Acinetobacter baumannii NOT DETECTED NOT DETECTED Final   Enterobacteriaceae species NOT DETECTED NOT DETECTED Final   Enterobacter cloacae complex NOT DETECTED NOT DETECTED Final   Escherichia coli NOT DETECTED NOT DETECTED Final   Klebsiella oxytoca NOT DETECTED NOT DETECTED Final   Klebsiella pneumoniae NOT DETECTED NOT DETECTED Final   Proteus species NOT DETECTED NOT DETECTED Final   Serratia marcescens NOT DETECTED NOT DETECTED Final   Carbapenem resistance NOT DETECTED NOT DETECTED Final   Haemophilus influenzae NOT DETECTED NOT DETECTED Final   Neisseria meningitidis NOT DETECTED NOT DETECTED Final   Pseudomonas aeruginosa NOT DETECTED NOT DETECTED Final   Candida albicans NOT DETECTED NOT DETECTED Final   Candida glabrata NOT DETECTED NOT DETECTED Final   Candida krusei NOT DETECTED NOT DETECTED Final   Candida parapsilosis NOT DETECTED NOT DETECTED Final   Candida tropicalis NOT DETECTED NOT DETECTED Final    Radiology Reports Dg Chest 1 View  07/18/2015  CLINICAL DATA:   Shortness of breath, history of lymphoma and congestive heart failure EXAM: CHEST 1 VIEW COMPARISON:  10/21/2014 FINDINGS: Stable mild cardiac enlargement. Status post CABG. Vascular pattern unchanged and within normal limits. No evidence of pulmonary edema. Mild bilateral costophrenic angle blunting. IMPRESSION: Stable cardiac enlargement with no definite evidence of pulmonary edema. Bilateral costophrenic angle blunting possibly indicating small pleural effusions. Electronically Signed   By: Skipper Cliche M.D.   On: 07/18/2015 14:41   Ct Abdomen Pelvis W Contrast  07/14/2015  CLINICAL DATA:  C/o abd pain and cramping x 2 days. Pt reports nausea and diarrhea yesterday. Denies vomiting. 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 EXAM: CT ABDOMEN AND PELVIS WITH CONTRAST TECHNIQUE: Multidetector CT imaging of the abdomen and pelvis was performed using the standard protocol following bolus administration of intravenous contrast. CONTRAST:  172mL ISOVUE-300 IOPAMIDOL (ISOVUE-300) INJECTION 61% COMPARISON:  08/18/2014 FINDINGS: Lung bases: Minor subsegmental atelectasis and/ or scarring. Heart normal size. Few mildly prominent, but nonenlarged, lymph nodes adjacent to the inferior descending thoracic aorta similar to the prior exam. Hepatobiliary: Fatty infiltration of the liver. No liver mass or focal lesion. Small dependent gallstones. No gallbladder wall thickening. There is enhancement along the wall of the common bile duct. Mild hazy opacity is noted in the fat along the porta hepatis. These findings are new from the prior exam. There is no bile duct dilation. Spleen, pancreas, adrenal glands:  Unremarkable. Kidneys, ureters, bladder: 5 mm low-density lesion  in the upper pole the right kidney consistent with a cyst, stable. No other renal masses or lesions, no stones and no hydronephrosis. Normal ureters. Bladder is unremarkable. Lymph nodes:  No pathologically enlarged lymph nodes. Ascites:  None.  Gastrointestinal: Scattered left colon diverticula. No diverticulitis. Stomach is unremarkable. Normal small bowel. There is some hazy opacity at the root of the small bowel mesenteries similar to the prior exam. Normal appendix is visualized. Vascular: There are scattered aortic atherosclerotic calcifications with more dense calcification along splenic artery. No aneurysm. Musculoskeletal:  No osteoblastic or osteolytic lesions. IMPRESSION: 1. Mild enhancement of the wall of the common bile duct with mild adjacent hazy inflammatory type change in the porta hepatis fat. Consider cholangitis in the proper clinical setting. There is no evidence of acute cholecystitis. Small gallstone is evident the gallbladder fundus. 2. There is some focal opacity at the root of the small bowel mesentery, which is similar to prior exam 3. Hepatic steatosis. 4. Left colon diverticula.  No diverticulitis. Electronically Signed   By: Lajean Manes M.D.   On: 07/14/2015 14:12     CBC  Recent Labs Lab 07/14/15 1039 07/15/15 0544 07/17/15 1234  WBC 11.7* 15.9* 8.4  HGB 13.2 12.4* 12.2*  HCT 39.1* 35.6* 36.3*  PLT 185 204 194  MCV 87.4 88.2 89.3  MCH 29.6 30.8 30.1  MCHC 33.8 34.9 33.7  RDW 13.1 14.1 14.1  LYMPHSABS 0.2*  --  1.4  MONOABS 0.4  --  0.5  EOSABS 0.0  --  0.2  BASOSABS 0.0  --  0.0    Chemistries   Recent Labs Lab 07/14/15 1039 07/15/15 0544 07/17/15 1234 07/18/15 0431 07/18/15 1200 07/19/15 0326  NA 134* 137 140 140  --  142  K 4.5 4.4 4.2 3.7  --  3.9  CL 100* 111 114* 113*  --  113*  CO2 21* 21* 21* 21*  --  25  GLUCOSE 351* 73 122* 113*  --  126*  BUN 10 17 15 12   --  16  CREATININE 1.03 0.96 0.80 0.79  --  0.79  CALCIUM 9.1 8.0* 7.9* 8.0*  --  8.4*  MG  --   --   --   --  2.1  --   AST 545* 223* 82* 64*  --   --   ALT 466* 269* 148* 120*  --   --   ALKPHOS 133* 101 214* 200*  --   --   BILITOT 4.4* 3.4* 1.9* 1.7*  --   --     ------------------------------------------------------------------------------------------------------------------ estimated creatinine clearance is 78.2 mL/min (by C-G formula based on Cr of 0.79). ------------------------------------------------------------------------------------------------------------------ No results for input(s): HGBA1C in the last 72 hours. ------------------------------------------------------------------------------------------------------------------ No results for input(s): CHOL, HDL, LDLCALC, TRIG, CHOLHDL, LDLDIRECT in the last 72 hours. ------------------------------------------------------------------------------------------------------------------  Recent Labs  07/18/15 1200  TSH 5.357*   ------------------------------------------------------------------------------------------------------------------ No results for input(s): VITAMINB12, FOLATE, FERRITIN, TIBC, IRON, RETICCTPCT in the last 72 hours.  Coagulation profile  Recent Labs Lab 07/15/15 0544  INR 1.16    No results for input(s): DDIMER in the last 72 hours.  Cardiac Enzymes  Recent Labs Lab 07/18/15 1200 07/18/15 1941 07/19/15 0326  TROPONINI 0.10* 0.12* 0.11*   ------------------------------------------------------------------------------------------------------------------ Invalid input(s): POCBNP    Assessment & Plan   * Shortness of breath: Check x-ray with minimal effusions, echocardiogram with no significant systolic dysfunction Continue incentive spirometry  * Atrial fibrillation  Continue higher dose Toprol Start on aspirin  * Acute  cholangitis with sepsis Blood culture with Clostridium perfringens,  Continue augmetin will need total of 14 days  Blood culture showing Clostridium Status post spinctercomy  outpatient surgery follow up for possible gallbladder surgery in the near future  *Gastric stenosis status post dilation  *Duodenal ulceration Protonix  twice a day  * Diabetes mellitus with hypoglycemia  Blood glucose currently stable can resume his home regimen on discharge  *Elevated troponin due to demand ischemia no chest pain  * Hypertension Continue patient's metoprolol and ramipril from home. Continue Lopressor IV when necessary.  * Chronic congestive heart failure. Unknown diastolic or systolic. Currently compensated monitor fluid status  * DVT prophylaxis with Lovenox.     Code Status Orders        Start     Ordered   07/14/15 1437  Full code   Continuous     07/14/15 1437    Code Status History    Date Active Date Inactive Code Status Order ID Comments User Context   08/18/2014  6:45 PM 08/23/2014  7:13 PM Full Code [id number]  Marlyce Huge, MD ED    Advance Directive Documentation        Most Recent Value   Type of Advance Directive  Healthcare Power of Attorney   Pre-existing out of facility DNR order (yellow form or pink MOST form)     "MOST" Form in Place?             ConsultsGI surgery DVT Prophylaxis  Lovenox   Lab Results  Component Value Date   PLT 194 07/17/2015     Time Spent in minutes   73min  Greater than 50% of time spent in care coordination and counseling patient regarding the condition and plan of care.   Dustin Flock M.D on 07/19/2015 at 2:11 PM  Between 7am to 6pm - Pager - [phone]  After 6pm go to [email or web address] - password [email or web address] [facility]   Office  [phone]

## 2015-07-19 NOTE — Progress Notes (Signed)
Patient without any current GI problems.  Will sign off. Please call with any questions.

## 2015-07-19 NOTE — Progress Notes (Signed)
Dr. Fritzi Mandes notified of 6 beat run of V Tach. No new orders obtained.

## 2015-07-20 DIAGNOSIS — I5033 Acute on chronic diastolic (congestive) heart failure: Secondary | ICD-10-CM

## 2015-07-20 DIAGNOSIS — R0602 Shortness of breath: Secondary | ICD-10-CM | POA: Insufficient documentation

## 2015-07-20 DIAGNOSIS — I480 Paroxysmal atrial fibrillation: Secondary | ICD-10-CM

## 2015-07-20 DIAGNOSIS — Z951 Presence of aortocoronary bypass graft: Secondary | ICD-10-CM

## 2015-07-20 LAB — CBC
HEMATOCRIT: 37.5 % — AB (ref 40.0–52.0)
HEMOGLOBIN: 12.8 g/dL — AB (ref 13.0–18.0)
MCH: 30.2 pg (ref 26.0–34.0)
MCHC: 34.3 g/dL (ref 32.0–36.0)
MCV: 88 fL (ref 80.0–100.0)
Platelets: 262 10*3/uL (ref 150–440)
RBC: 4.26 MIL/uL — ABNORMAL LOW (ref 4.40–5.90)
RDW: 14.5 % (ref 11.5–14.5)
WBC: 9 10*3/uL (ref 3.8–10.6)

## 2015-07-20 LAB — GLUCOSE, CAPILLARY
GLUCOSE-CAPILLARY: 149 mg/dL — AB (ref 65–99)
Glucose-Capillary: 108 mg/dL — ABNORMAL HIGH (ref 65–99)
Glucose-Capillary: 109 mg/dL — ABNORMAL HIGH (ref 65–99)

## 2015-07-20 MED ORDER — PANTOPRAZOLE SODIUM 40 MG PO TBEC: 40.0000 mg | DELAYED_RELEASE_TABLET | Freq: Two times a day (BID) | ORAL | Status: DC

## 2015-07-20 MED ORDER — AMOXICILLIN-POT CLAVULANATE 875-125 MG PO TABS: 1.0000 | ORAL_TABLET | Freq: Two times a day (BID) | ORAL | Status: DC

## 2015-07-20 MED ORDER — POLYETHYLENE GLYCOL 3350 17 G PO PACK: 17.0000 g | PACK | Freq: Every day | ORAL | Status: DC | PRN

## 2015-07-20 MED ORDER — FUROSEMIDE 40 MG PO TABS: 40.0000 mg | ORAL_TABLET | Freq: Every day | ORAL | Status: DC

## 2015-07-20 MED ORDER — METOPROLOL SUCCINATE ER 50 MG PO TB24: 50.0000 mg | ORAL_TABLET | Freq: Two times a day (BID) | ORAL | Status: DC

## 2015-07-20 MED ORDER — HYDROCODONE-ACETAMINOPHEN 5-325 MG PO TABS: 1.0000 | ORAL_TABLET | Freq: Four times a day (QID) | ORAL | Status: DC | PRN

## 2015-07-20 MED ORDER — ONDANSETRON HCL 4 MG PO TABS: 4.0000 mg | ORAL_TABLET | Freq: Four times a day (QID) | ORAL | Status: DC | PRN

## 2015-07-20 MED ORDER — KETOTIFEN FUMARATE 0.025 % OP SOLN: 1.0000 [drp] | Freq: Two times a day (BID) | OPHTHALMIC | Status: DC

## 2015-07-20 MED ORDER — POTASSIUM CHLORIDE CRYS ER 20 MEQ PO TBCR: 20.0000 meq | EXTENDED_RELEASE_TABLET | Freq: Every day | ORAL | Status: DC

## 2015-07-20 NOTE — Care Management Important Message (Signed)
Important Message  Patient Details  Name: Joseph Flowers MRN: [id number] Date of Birth: 1927/12/24   Medicare Important Message Given:  Yes    [name] A Drayson Dorko 07/20/2015, 11:14 AM

## 2015-07-20 NOTE — Discharge Summary (Signed)
[facility] NAME: Joseph Flowers    MR#:  [id number]  DATE OF BIRTH:  1927-09-06  DATE OF ADMISSION:  07/14/2015 ADMITTING PHYSICIAN: Hillary Bow, MD  DATE OF DISCHARGE:07/20/2015  PRIMARY CARE PHYSICIAN: Ola Spurr, DAVID [name], MD    ADMISSION DIAGNOSIS:  Sepsis, due to unspecified organism ([place]) [A41.9]  DISCHARGE DIAGNOSIS:  Active Problems:   Cholangitis   Calculus of gallbladder with biliary obstruction but without cholecystitis   Postpyloric ulcer   Esophagitis, unspecified   Acquired hypertrophic pyloric stenosis   Atrial fibrillation ([place])   Coronary artery disease involving native heart without angina pectoris   S/P CABG x 5   Diastolic CHF ([place])    Bacteremia- CLOSTRIDIUM PERFRINGENS in blood culture  SECONDARY DIAGNOSIS:   Past Medical History  Diagnosis Date  . Diabetes mellitus without complication ([place])   . Hypertension   . Hyperlipemia   . 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 Diastolic CHF ([place]) [id number]  . Coronary artery disease involving native heart without angina pectoris 2002    s/p CABG x 5 (Dr. Roxy Manns  . S/P CABG x 5 2002    Dr. Roxy Manns  . Atrial fibrillation ([place])     a. first diagnosed 06/2015; b. CHADS2VASc => 6 (CHF, HTN, age x 2, DM, vascualr disease)    HOSPITAL COURSE:   * Shortness of breath: Check x-ray with minimal effusions, echocardiogram with no significant systolic dysfunction Continue incentive spirometry  * Atrial fibrillation  Continue higher dose Toprol Started on aspirin  Due to Peptic ulcer- and possible need for Gall bladder surgery- Not starting on Long term anticoagulation at this time,    But in future - May need to start on anticoagulation due to high CHADs2 score.  * Acute cholangitis with sepsis Blood culture with Clostridium perfringens,  Continue augmetin will need total of 14 days.  Blood culture showing Clostridium. Status post  spinctercomy outpatient surgery follow up for possible gallbladder surgery in the near future  *Gastric stenosis status post dilation  *Duodenal ulceration Protonix twice a day   Follow in [place] clinic in 1 month.  * Diabetes mellitus with hypoglycemia   Blood glucose currently stable can resume his home regimen on discharge.  *Elevated troponin due to demand ischemia no chest pain  * Hypertension Continue patient's metoprolol and ramipril from home. Continue Lopressor IV when necessary.  * Chronic congestive heart failure. Unknown diastolic or systolic. Currently compensated monitor fluid status  * DVT prophylaxis with Lovenox.   DISCHARGE CONDITIONS:   Stable.  CONSULTS OBTAINED:  Treatment Team:  Leonie Man, MD  DRUG ALLERGIES:  No Known Allergies  DISCHARGE MEDICATIONS:   Current Discharge Medication List    START taking these medications   Details  amoxicillin-clavulanate (AUGMENTIN) 875-125 MG tablet Take 1 tablet by mouth every 12 (twelve) hours. For 10 days. Qty: 20 tablet, Refills: 0    HYDROcodone-acetaminophen (NORCO/VICODIN) 5-325 MG tablet Take 1 tablet by mouth every 6 (six) hours as needed for moderate pain or severe pain. Qty: 30 tablet, Refills: 0    ketotifen (ZADITOR) 0.025 % ophthalmic solution Place 1 drop into both eyes 2 (two) times daily. Qty: 5 mL, Refills: 0    ondansetron (ZOFRAN) 4 MG tablet Take 1 tablet (4 mg total) by mouth every 6 (six) hours as needed for nausea. Qty: 20 tablet, Refills: 0    pantoprazole (PROTONIX) 40 MG tablet Take 1  tablet (40 mg total) by mouth 2 (two) times daily before a meal. Qty: 60 tablet, Refills: 0    polyethylene glycol (MIRALAX / GLYCOLAX) packet Take 17 g by mouth daily as needed for mild constipation. Qty: 14 each, Refills: 0    potassium chloride SA (K-DUR,KLOR-CON) 20 MEQ tablet Take 1 tablet (20 mEq total) by mouth daily. Qty: 30 tablet, Refills: 0      CONTINUE these medications  which have CHANGED   Details  metoprolol succinate (TOPROL-XL) 50 MG 24 hr tablet Take 1 tablet (50 mg total) by mouth 2 (two) times daily. Take with or immediately following a meal. Qty: 60 tablet, Refills: 0      CONTINUE these medications which have NOT CHANGED   Details  aspirin 325 MG tablet Take 162.5 mg by mouth 2 (two) times daily.     finasteride (PROSCAR) 5 MG tablet Take 5 mg by mouth daily.     fluorouracil (EFUDEX) 5 % cream Apply 1 application topically 2 (two) times daily. Apply on right cheek for 3 to 4 weeks.    furosemide (LASIX) 20 MG tablet Take 20 mg by mouth daily.     glimepiride (AMARYL) 4 MG tablet Take 4 mg by mouth daily with breakfast.    metFORMIN (GLUCOPHAGE) 500 MG tablet Take 500 mg by mouth daily with breakfast.     ramipril (ALTACE) 10 MG capsule Take 10 mg by mouth daily.     simvastatin (ZOCOR) 20 MG tablet Take 20 mg by mouth at bedtime.          DISCHARGE INSTRUCTIONS:    Follow with PMD, GI and surgery clinic.  If you experience worsening of your admission symptoms, develop shortness of breath, life threatening emergency, suicidal or homicidal thoughts you must seek medical attention immediately by calling 911 or calling your MD immediately  if symptoms less severe.  You Must read complete instructions/literature along with all the possible adverse reactions/side effects for all the Medicines you take and that have been prescribed to you. Take any new Medicines after you have completely understood and accept all the possible adverse reactions/side effects.   Please note  You were cared for by a hospitalist during your hospital stay. If you have any questions about your discharge medications or the care you received while you were in the hospital after you are discharged, you can call the unit and asked to speak with the hospitalist on call if the hospitalist that took care of you is not available. Once you are discharged, your primary care  physician will handle any further medical issues. Please note that NO REFILLS for any discharge medications will be authorized once you are discharged, as it is imperative that you return to your primary care physician (or establish a relationship with a primary care physician if you do not have one) for your aftercare needs so that they can reassess your need for medications and monitor your lab values.    Today   CHIEF COMPLAINT:   Chief Complaint  Patient presents with  . Abdominal Pain    HISTORY OF PRESENT ILLNESS:  Joseph Flowers  is a 82 y.o. male with a known history of Diabetes, hypertension, CHF presents to the emergency room complaining of 2 days of right upper quadrant and epigastric area abdominal pain. Patient has had nausea but no vomiting. 3 episodes of loose stools. No blood. He has had lightheadedness and fatigue. Chills but no fever. Here in the emergency room patient  has tachycardia and leukocytosis. CT scan showed cholangitis with stone in the gallbladder fundus. No cholecystitis on CAT scan.   VITAL SIGNS:  Blood pressure 158/77, pulse 77, temperature 97.6 F (36.4 C), temperature source Oral, resp. rate 18, height 5\' 10"  (1.778 m), weight 92.488 kg (203 lb 14.4 oz), SpO2 98 %.  I/O:   Intake/Output Summary (Last 24 hours) at 07/20/15 1008 Last data filed at 07/20/15 0850  Gross per 24 hour  Intake    480 ml  Output   2150 ml  Net  -1670 ml    PHYSICAL EXAMINATION:  GENERAL:  82 y.o.-year-old patient lying in the bed with no acute distress.  EYES: Pupils equal, round, reactive to light and accommodation. No scleral icterus. Extraocular muscles intact.  HEENT: Head atraumatic, normocephalic. Oropharynx and nasopharynx clear.  NECK:  Supple, no jugular venous distention. No thyroid enlargement, no tenderness.  LUNGS: Normal breath sounds bilaterally, no wheezing, rales,rhonchi or crepitation. No use of accessory muscles of respiration.  CARDIOVASCULAR: S1,  S2 normal. No murmurs, rubs, or gallops.  ABDOMEN: Soft, non-tender, non-distended. Bowel sounds present. No organomegaly or mass.  EXTREMITIES: No pedal edema, cyanosis, or clubbing.  NEUROLOGIC: Cranial nerves II through XII are intact. Muscle strength 5/5 in all extremities. Sensation intact. Gait not checked.  PSYCHIATRIC: The patient is alert and oriented x 3.  SKIN: No obvious rash, lesion, or ulcer.   DATA REVIEW:   CBC  Recent Labs Lab 07/20/15 0311  WBC 9.0  HGB 12.8*  HCT 37.5*  PLT 262    Chemistries   Recent Labs Lab 07/18/15 0431 07/18/15 1200 07/19/15 0326  NA 140  --  142  K 3.7  --  3.9  CL 113*  --  113*  CO2 21*  --  25  GLUCOSE 113*  --  126*  BUN 12  --  16  CREATININE 0.79  --  0.79  CALCIUM 8.0*  --  8.4*  MG  --  2.1  --   AST 64*  --   --   ALT 120*  --   --   ALKPHOS 200*  --   --   BILITOT 1.7*  --   --     Cardiac Enzymes  Recent Labs Lab 07/19/15 0326  TROPONINI 0.11*    Microbiology Results  Results for orders placed or performed during the hospital encounter of 07/14/15  Urine culture     Status: None   Collection Time: 07/14/15 10:39 AM  Result Value Ref Range Status   Specimen Description URINE, RANDOM  Final   Special Requests NONE  Final   Culture NO GROWTH Performed at [facility]   Final   Report Status 07/15/2015 FINAL  Final  Blood Culture (routine x 2)     Status: Abnormal   Collection Time: 07/14/15 12:19 PM  Result Value Ref Range Status   Specimen Description BLOOD RIGHT ARM  Final   Special Requests BOTTLES DRAWN AEROBIC AND ANAEROBIC 1 CC  Final   Culture  Setup Time   Final    GRAM VARIABLE ROD ANAEROBIC BOTTLE ONLY CRITICAL RESULT CALLED TO, READ BACK BY AND VERIFIED WITH: PREVIOUSLY CALLED TO MATT MCBANE AND ROBYN THOMAS AT 0122 ON 07/15/15 [facility] CONFIRMED BY [facility]    Culture (A)  Final    CLOSTRIDIUM PERFRINGENS ANAEROBIC BOTTLE ONLY BETA LACTAMASE POSITIVE    Report Status 07/19/2015 FINAL   Final  Blood Culture (routine x 2)  Status: Abnormal   Collection Time: 07/14/15 12:19 PM  Result Value Ref Range Status   Specimen Description BLOOD LEFT FOREARM  Final   Special Requests BOTTLES DRAWN AEROBIC AND ANAEROBIC 1 CC  Final   Culture  Setup Time   Final    GRAM VARIABLE ROD ANAEROBIC BOTTLE ONLY CRITICAL RESULT CALLED TO, READ BACK BY AND VERIFIED WITH: MATT MCBANE AND ROBYN THOMAS AT [place] ON 07/15/15 [facility] CONFIRMED BY [facility]    Culture (A)  Final    CLOSTRIDIUM PERFRINGENS ANAEROBIC BOTTLE ONLY BETA LACTAMASE POSITIVE    Report Status 07/19/2015 FINAL  Final  Blood Culture ID Panel (Reflexed)     Status: None   Collection Time: 07/14/15 12:19 PM  Result Value Ref Range Status   Enterococcus species NOT DETECTED NOT DETECTED Final   Vancomycin resistance NOT DETECTED NOT DETECTED Final   Listeria monocytogenes NOT DETECTED NOT DETECTED Final   Staphylococcus species NOT DETECTED NOT DETECTED Final   Staphylococcus aureus NOT DETECTED NOT DETECTED Final   Methicillin resistance NOT DETECTED NOT DETECTED Final   Streptococcus species NOT DETECTED NOT DETECTED Final   Streptococcus agalactiae NOT DETECTED NOT DETECTED Final   Streptococcus pneumoniae NOT DETECTED NOT DETECTED Final   Streptococcus pyogenes NOT DETECTED NOT DETECTED Final   Acinetobacter baumannii NOT DETECTED NOT DETECTED Final   Enterobacteriaceae species NOT DETECTED NOT DETECTED Final   Enterobacter cloacae complex NOT DETECTED NOT DETECTED Final   Escherichia coli NOT DETECTED NOT DETECTED Final   Klebsiella oxytoca NOT DETECTED NOT DETECTED Final   Klebsiella pneumoniae NOT DETECTED NOT DETECTED Final   Proteus species NOT DETECTED NOT DETECTED Final   Serratia marcescens NOT DETECTED NOT DETECTED Final   Carbapenem resistance NOT DETECTED NOT DETECTED Final   Haemophilus influenzae NOT DETECTED NOT DETECTED Final   Neisseria meningitidis NOT DETECTED NOT DETECTED Final   Pseudomonas aeruginosa  NOT DETECTED NOT DETECTED Final   Candida albicans NOT DETECTED NOT DETECTED Final   Candida glabrata NOT DETECTED NOT DETECTED Final   Candida krusei NOT DETECTED NOT DETECTED Final   Candida parapsilosis NOT DETECTED NOT DETECTED Final   Candida tropicalis NOT DETECTED NOT DETECTED Final    RADIOLOGY:  Dg Chest 1 View  07/18/2015  CLINICAL DATA:  Shortness of breath, history of lymphoma and congestive heart failure EXAM: CHEST 1 VIEW COMPARISON:  10/21/2014 FINDINGS: Stable mild cardiac enlargement. Status post CABG. Vascular pattern unchanged and within normal limits. No evidence of pulmonary edema. Mild bilateral costophrenic angle blunting. IMPRESSION: Stable cardiac enlargement with no definite evidence of pulmonary edema. Bilateral costophrenic angle blunting possibly indicating small pleural effusions. Electronically Signed   By: Skipper Cliche M.D.   On: 07/18/2015 14:41     Management plans discussed with the patient, family and they are in agreement.  CODE STATUS:     Code Status Orders        Start     Ordered   07/14/15 1437  Full code   Continuous     07/14/15 1437    Code Status History    Date Active Date Inactive Code Status Order ID Comments User Context   08/18/2014  6:45 PM 08/23/2014  7:13 PM Full Code [id number]  Marlyce Huge, MD ED    Advance Directive Documentation        Most Recent Value   Type of Advance Directive  Healthcare Power of Attorney   Pre-existing out of facility DNR order (yellow form or pink MOST form)     "  MOST" Form in Place?        TOTAL TIME TAKING CARE OF THIS PATIENT: 35 minutes.    [name] M.D on 07/20/2015 at 10:08 AM  Between 7am to 6pm - Pager - [phone]  After 6pm go to [email or web address] - password [email or web address] [facility]  Office  [phone]  CC: Primary care physician; FITZGERALD, DAVID [name], MD   Note: This dictation was prepared with Dragon dictation along with smaller  phrase technology. Any transcriptional errors that result from this process are unintentional.

## 2015-07-20 NOTE — Care Management (Signed)
Admitted to this facility with the diagnosis of Cholangitis. Lives alone. Daughter/POA is Ardean Larsen [phone]). Son is Romeo Apple [phone]). Last seen Dr. Ola Spurr in April, next appointment is July. No home health. No skilled facility. No home oxygen. No home oxygen. Uses no aids for ambulation. No Life Alert. Takes care of all basic and instrumental activities of daily living himself, drives. No falls. Appetite better. Son will transport. Physical therapy evaluation completed. Recommends home with home health & physical therapy. Discussed agencies with Mr. Shon. [place]. Will update Floydene Flock, representative for [facility] Discharge to home today per Dr. Anselm Jungling. Shelbie Ammons RN MSN CCM Care Management [phone]

## 2015-07-20 NOTE — Progress Notes (Signed)
Discharge: Pt d/c from room via wheelchair, Family member with the pt. Discharge instructions given to the patient and family members.  No questions from pt, reintegrated to the pt to call or go to the ED for chest discomfort. Pt dressed in street clothes and left with discharge papers and prescriptions in hand. IV d/ced, tele removed and no complaints of pain or discomfort. 

## 2015-07-20 NOTE — Progress Notes (Signed)
Pt alert and oriented x4, no complaints of pain or discomfort.  Bed in low position, call bell within reach.  Bed alarms on and functioning.  Assessment done and charted.  Will continue to monitor and do hourly rounding throughout the shift 

## 2015-07-20 NOTE — Discharge Instructions (Signed)
Follow with GI, Surgery and PMD in next 2-3 weeks.

## 2015-07-23 NOTE — Progress Notes (Signed)
[facility]  Patient Status:patient declined services, 5/29, patient stated he was doing fine managing own care at home     If patient discharges after hours, please call [phone].   Florene Glen 07/23/2015, 12:14 PM

## 2015-08-02 ENCOUNTER — Other Ambulatory Visit: Payer: Self-pay

## 2015-08-02 DIAGNOSIS — E785 Hyperlipidemia, unspecified: Secondary | ICD-10-CM | POA: Insufficient documentation

## 2015-08-02 DIAGNOSIS — Z87442 Personal history of urinary calculi: Secondary | ICD-10-CM | POA: Insufficient documentation

## 2015-08-02 DIAGNOSIS — E039 Hypothyroidism, unspecified: Secondary | ICD-10-CM | POA: Insufficient documentation

## 2015-08-02 DIAGNOSIS — E119 Type 2 diabetes mellitus without complications: Secondary | ICD-10-CM | POA: Insufficient documentation

## 2015-08-02 DIAGNOSIS — I219 Acute myocardial infarction, unspecified: Secondary | ICD-10-CM | POA: Insufficient documentation

## 2015-08-02 DIAGNOSIS — N400 Enlarged prostate without lower urinary tract symptoms: Secondary | ICD-10-CM | POA: Insufficient documentation

## 2015-08-02 DIAGNOSIS — I10 Essential (primary) hypertension: Secondary | ICD-10-CM | POA: Insufficient documentation

## 2015-08-06 ENCOUNTER — Telehealth: Payer: Self-pay

## 2015-08-06 ENCOUNTER — Ambulatory Visit (INDEPENDENT_AMBULATORY_CARE_PROVIDER_SITE_OTHER): Payer: Medicare Other | Admitting: General Surgery

## 2015-08-06 ENCOUNTER — Encounter: Payer: Self-pay | Admitting: General Surgery

## 2015-08-06 VITALS — BP 160/92 | HR 87 | Temp 96.0°F | Ht 70.0 in | Wt 194.0 lb

## 2015-08-06 DIAGNOSIS — K8309 Other cholangitis: Secondary | ICD-10-CM

## 2015-08-06 DIAGNOSIS — K830 Cholangitis: Secondary | ICD-10-CM | POA: Diagnosis not present

## 2015-08-06 NOTE — Addendum Note (Signed)
Addended by: Phillips Odor on: 08/06/2015 04:51 PM   Modules accepted: Orders, Medications

## 2015-08-06 NOTE — Progress Notes (Signed)
Outpatient Surgical Follow Up  08/06/2015  Joseph Flowers is an 82 y.o. male.   Chief Complaint  Patient presents with  . Follow-up    Cholangitis    HPI: A 82 year old male returns to clinic for evaluation after hospitalization for cholangitis. Patient had complete resolution of his symptoms after undergoing an ERCP with Dr. Allen Flowers patient denies any current fevers, chills, nausea, vomiting, diarrhea, constipation. He did have some diarrhea after discharge but this is already resolved. He was very happy with his medical care during his most recent hospitalization.  Past Medical History  Diagnosis Date  . Diabetes mellitus without complication ([place])   . Hypertension   . Hyperlipemia   . 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 Diastolic CHF ([place]) [id number]  . Coronary artery disease involving native heart without angina pectoris 2002    s/p CABG x 5 (Dr. Roxy Flowers  . S/P CABG x 5 2002    Dr. Roxy Flowers  . Atrial fibrillation ([place])     a. first diagnosed 06/2015; b. CHADS2VASc => 6 (CHF, HTN, age x 2, DM, vascualr disease)    Past Surgical History  Procedure Laterality Date  . Coronary artery bypass graft    . Endoscopic retrograde cholangiopancreatography (ercp) with propofol N/A 07/16/2015    Procedure: ENDOSCOPIC RETROGRADE CHOLANGIOPANCREATOGRAPHY (ERCP) WITH PROPOFOL;  Surgeon: Lucilla Lame, MD;  Location: [facility] ENDOSCOPY;  Service: Endoscopy;  Laterality: N/A;    Family History  Problem Relation Age of Onset  . Cancer Mother   . Cancer Father     Social History:  reports that he has never smoked. He has never used smokeless tobacco. He reports that he does not drink alcohol or use illicit drugs.  Allergies: No Known Allergies  Medications reviewed.    ROS A multipoint review of systems was completed. All pertinent positives and negatives are documented within the history of present illness and remainder are negative.   BP 160/92 mmHg  Pulse 87  Temp(Src) 96 F (35.6 C)  (Oral)  Ht 5\' 10"  (1.778 m)  Wt 87.998 kg (194 lb)  BMI 27.84 kg/m2  Physical Exam Gen.: No acute distress Face: Area of previous biopsy with lotion and placed to the right face. Neck: Soft and nontender Lymph nodes: No evidence of cervical or clavicular lymphadenopathy Chest: Clear to auscultation Heart: Regular rhythm Abdomen: Soft, nontender, nondistended    No results found for this or any previous visit (from the past 48 hour(s)). No results found.  Assessment/Plan:  1. Cholangitis 82 year old male with recent cholangitis. Currently completely asymptomatic. Given his multiple medical problems patient will be a high risk surgical candidate and thus surgical intervention is not warranted at this time. Discussed the signs and symptoms of recurrence of his cholangitis or a gallbladder attack and to seek medical attention immediately should they occur. Patient voiced understanding and all questions were answered to the patient's satisfaction. He'll follow-up in clinic on an as-needed basis.     Joseph Pert, MD FACS General Surgeon  08/06/2015,4:12 PM

## 2015-08-06 NOTE — Telephone Encounter (Signed)
After speaking with Dr. Adonis Huguenin, patient is in need of Abdominal X-ray to verify Biliary stent placement and to see if there is a need for removal.   Order placed.  Call made to patient at this time. No answer. Left voicemail for return phone call.

## 2015-08-06 NOTE — Patient Instructions (Signed)
Please call our office if you have any questions or concerns.  

## 2015-08-08 NOTE — Telephone Encounter (Signed)
Returned phone call to patient at this time. Explained that he would need to have an abdominal x-ray completed this week preferably.  Patient was given directions to go to the medical mall and asked to go this week. Will call patient with results once they have been obtained.

## 2015-08-09 ENCOUNTER — Ambulatory Visit
Admission: RE | Admit: 2015-08-09 | Discharge: 2015-08-09 | Disposition: A | Discharge status: Home / Self Care | Payer: Medicare Other | Source: Ambulatory Visit | Attending: General Surgery | Admitting: General Surgery

## 2015-08-09 DIAGNOSIS — K830 Cholangitis: Secondary | ICD-10-CM | POA: Insufficient documentation

## 2015-08-09 DIAGNOSIS — K8309 Other cholangitis: Secondary | ICD-10-CM

## 2015-08-10 NOTE — Telephone Encounter (Signed)
Returned phone call to patient to advise him that no other follow-up is necessary unless he has questions or complaints and in this case, I encouraged the patient to call.

## 2015-08-23 ENCOUNTER — Ambulatory Visit: Payer: Medicare Other | Admitting: Gastroenterology

## 2015-10-12 ENCOUNTER — Other Ambulatory Visit: Payer: Medicare Other

## 2015-10-12 ENCOUNTER — Ambulatory Visit: Payer: Medicare Other | Admitting: Internal Medicine

## 2015-11-09 ENCOUNTER — Other Ambulatory Visit: Payer: Self-pay | Admitting: *Deleted

## 2015-11-09 DIAGNOSIS — C8210 Follicular lymphoma grade II, unspecified site: Secondary | ICD-10-CM

## 2015-11-12 DIAGNOSIS — C8218 Follicular lymphoma grade II, lymph nodes of multiple sites: Secondary | ICD-10-CM | POA: Insufficient documentation

## 2015-11-12 NOTE — Progress Notes (Signed)
[facility]  [phone] [phone]  ID: Joseph Flowers OB: 05/05/1927  MR#: [id number]  CSN#:[id number]  Patient Care Team: David P Fitzgerald, MD as PCP - General (Infectious Diseases) Sital Mody, MD as Consulting Physician (Internal Medicine)  CHIEF COMPLAINT: Clinical stage IIIa, grade 2 follicular lymphoma.  INTERVAL HISTORY: Patient returns to clinic today for routine yearly follow-up. He currently feels well and is asymptomatic. He recently had an episode of cholangitis, but this is now resolved. He has no neurologic complaints. He denies any fevers, night sweats, or weight loss. He denies any B symptoms. He has noted no new lymphadenopathy. He denies any chest pain or shortness of breath. He denies any nausea, vomiting, constipation, or diarrhea. He has no urinary complaints. Patient feels at his baseline and offers no specific complaints today.  REVIEW OF SYSTEMS:   ROS  As per HPI. Otherwise, a complete review of systems is negative.  PAST MEDICAL HISTORY: Past Medical History:  Diagnosis Date  . Atrial fibrillation (HCC)    a. first diagnosed 06/2015; b. CHADS2VASc => 6 (CHF, HTN, age x 2, DM, vascualr disease)  . 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 Coronary artery disease involving native heart without angina pectoris 2002   s/p CABG x 5 (Dr. Owen  . Diabetes mellitus without complication (HCC)   . Diastolic CHF (HCC) 2001  . Hyperlipemia   . Hypertension   . S/P CABG x 5 2002   Dr. Owen    PAST SURGICAL HISTORY: Past Surgical History:  Procedure Laterality Date  . CORONARY ARTERY BYPASS GRAFT    . ENDOSCOPIC RETROGRADE CHOLANGIOPANCREATOGRAPHY (ERCP) WITH PROPOFOL N/A 07/16/2015   Procedure: ENDOSCOPIC RETROGRADE CHOLANGIOPANCREATOGRAPHY (ERCP) WITH PROPOFOL;  Surgeon: Darren Wohl, MD;  Location: [facility] ENDOSCOPY;  Service: Endoscopy;  Laterality: N/A;    FAMILY HISTORY: Family History  Problem Relation Age of  Onset  . Cancer Mother   . Cancer Father     ADVANCED DIRECTIVES (Y/N):  N  HEALTH MAINTENANCE: Social History  Substance Use Topics  . Smoking status: Never Smoker  . Smokeless tobacco: Never Used  . Alcohol use No     Colonoscopy:  PAP:  Bone density:  Lipid panel:  No Known Allergies  Current Outpatient Prescriptions  Medication Sig Dispense Refill  . aspirin 325 MG tablet Take 162.5 mg by mouth 2 (two) times daily.     . finasteride (PROSCAR) 5 MG tablet Take 5 mg by mouth daily.     . furosemide (LASIX) 20 MG tablet Take 20 mg by mouth daily.     . glimepiride (AMARYL) 4 MG tablet Take 4 mg by mouth daily with breakfast.    . metFORMIN (GLUCOPHAGE) 500 MG tablet Take 500 mg by mouth daily with breakfast.     . metoprolol succinate (TOPROL-XL) 50 MG 24 hr tablet Take 1 tablet (50 mg total) by mouth 2 (two) times daily. Take with or immediately following a meal. (Patient taking differently: Take 50 mg by mouth. Take with or immediately following a meal.) 60 tablet 0  . ramipril (ALTACE) 10 MG capsule Take 10 mg by mouth daily.     . simvastatin (ZOCOR) 20 MG tablet Take 20 mg by mouth at bedtime.      No current facility-administered medications for this visit.     OBJECTIVE: Vitals:   11/13/15 1019  BP: (!) 158/77  Pulse: 66  Resp: 18  Temp: (!) 96.3 F (35.7 C)       Body mass index is 28.44 kg/m.    ECOG FS:0 - Asymptomatic  General: Well-developed, well-nourished, no acute distress. Eyes: Pink conjunctiva, anicteric sclera. HEENT: Normocephalic, moist mucous membranes, clear oropharnyx. Lungs: Clear to auscultation bilaterally. Heart: Regular rate and rhythm. No rubs, murmurs, or gallops. Abdomen: Soft, nontender, nondistended. No organomegaly noted, normoactive bowel sounds. Musculoskeletal: No edema, cyanosis, or clubbing. Neuro: Alert, answering all questions appropriately. Cranial nerves grossly intact. Skin: No rashes or petechiae noted. Psych: Normal  affect. Lymphatics: No cervical, calvicular, axillary or inguinal LAD.   LAB RESULTS:  Lab Results  Component Value Date   NA 133 (L) 11/13/2015   K 4.1 11/13/2015   CL 104 11/13/2015   CO2 24 11/13/2015   GLUCOSE 218 (H) 11/13/2015   BUN 14 11/13/2015   CREATININE 0.96 11/13/2015   CALCIUM 9.1 11/13/2015   PROT 6.7 11/13/2015   ALBUMIN 3.9 11/13/2015   AST 26 11/13/2015   ALT 23 11/13/2015   ALKPHOS 56 11/13/2015   BILITOT 0.5 11/13/2015   GFRNONAA >60 11/13/2015   GFRAA >60 11/13/2015    Lab Results  Component Value Date   WBC 5.8 11/13/2015   NEUTROABS 2.7 11/13/2015   HGB 13.5 11/13/2015   HCT 38.4 (L) 11/13/2015   MCV 86.8 11/13/2015   PLT 205 11/13/2015     STUDIES: No results found.  ASSESSMENT: Clinical stage IIIa, grade 2 follicular lymphoma.  PLAN:    1. Clinical stage IIIa, grade 2 follicular lymphoma: Patient was noted to have bulky retroperitoneal and intraperitoneal lymphadenopathy on PET scan in April 2012. Bone marrow biopsy at that time was negative for disease. He subsequently underwent 6 cycles of Rituxan plus CVP completing on October 11, 2010. Patient only received 1 dose of maintenance Rituxan therapy on November 01, 2010. Patient's most recent CT scan on Jul 14, 2015 did not reveal evidence of recurrent lymphoma. Patient has requested no further CT scans unless he is symptomatic or there is concerns for recurrence.  Patient is now 5 years removed from completing his treatment with no evidence of disease. After lengthy discussion with the patient, he has agreed that no further follow-up in the [place] is necessary. Continue close follow-up with patient's primary care. If there is suspicion of recurrence or any questions please refer patient back for further evaluation.  Patient expressed understanding and was in agreement with this plan. He also understands that He can call clinic at any time with any questions, concerns, or complaints.    Lloyd Huger, MD   11/13/2015 11:00 AM

## 2015-11-13 ENCOUNTER — Inpatient Hospital Stay: Payer: Medicare Other | Attending: Oncology | Admitting: Oncology

## 2015-11-13 ENCOUNTER — Ambulatory Visit: Payer: Medicare Other

## 2015-11-13 ENCOUNTER — Inpatient Hospital Stay: Payer: Medicare Other

## 2015-11-13 ENCOUNTER — Other Ambulatory Visit: Payer: Self-pay

## 2015-11-13 DIAGNOSIS — Z79899 Other long term (current) drug therapy: Secondary | ICD-10-CM | POA: Insufficient documentation

## 2015-11-13 DIAGNOSIS — Z9221 Personal history of antineoplastic chemotherapy: Secondary | ICD-10-CM

## 2015-11-13 DIAGNOSIS — E785 Hyperlipidemia, unspecified: Secondary | ICD-10-CM | POA: Diagnosis not present

## 2015-11-13 DIAGNOSIS — I4891 Unspecified atrial fibrillation: Secondary | ICD-10-CM | POA: Insufficient documentation

## 2015-11-13 DIAGNOSIS — I509 Heart failure, unspecified: Secondary | ICD-10-CM | POA: Diagnosis not present

## 2015-11-13 DIAGNOSIS — E119 Type 2 diabetes mellitus without complications: Secondary | ICD-10-CM | POA: Diagnosis not present

## 2015-11-13 DIAGNOSIS — C8210 Follicular lymphoma grade II, unspecified site: Secondary | ICD-10-CM

## 2015-11-13 DIAGNOSIS — I10 Essential (primary) hypertension: Secondary | ICD-10-CM | POA: Insufficient documentation

## 2015-11-13 DIAGNOSIS — Z8572 Personal history of non-Hodgkin lymphomas: Secondary | ICD-10-CM | POA: Diagnosis not present

## 2015-11-13 DIAGNOSIS — C8218 Follicular lymphoma grade II, lymph nodes of multiple sites: Secondary | ICD-10-CM

## 2015-11-13 LAB — CBC WITH DIFFERENTIAL/PLATELET
Basophils Absolute: 0 10*3/uL (ref 0–0.1)
Basophils Relative: 1 %
EOS ABS: 0.2 10*3/uL (ref 0–0.7)
Eosinophils Relative: 4 %
HEMATOCRIT: 38.4 % — AB (ref 40.0–52.0)
HEMOGLOBIN: 13.5 g/dL (ref 13.0–18.0)
LYMPHS ABS: 2.5 10*3/uL (ref 1.0–3.6)
Lymphocytes Relative: 43 %
MCH: 30.5 pg (ref 26.0–34.0)
MCHC: 35.1 g/dL (ref 32.0–36.0)
MCV: 86.8 fL (ref 80.0–100.0)
Monocytes Absolute: 0.3 10*3/uL (ref 0.2–1.0)
Monocytes Relative: 6 %
NEUTROS ABS: 2.7 10*3/uL (ref 1.4–6.5)
NEUTROS PCT: 46 %
Platelets: 205 10*3/uL (ref 150–440)
RBC: 4.42 MIL/uL (ref 4.40–5.90)
RDW: 13.3 % (ref 11.5–14.5)
WBC: 5.8 10*3/uL (ref 3.8–10.6)

## 2015-11-13 LAB — COMPREHENSIVE METABOLIC PANEL
ALBUMIN: 3.9 g/dL (ref 3.5–5.0)
ALK PHOS: 56 U/L (ref 38–126)
ALT: 23 U/L (ref 17–63)
AST: 26 U/L (ref 15–41)
Anion gap: 5 (ref 5–15)
BUN: 14 mg/dL (ref 6–20)
CALCIUM: 9.1 mg/dL (ref 8.9–10.3)
CO2: 24 mmol/L (ref 22–32)
Chloride: 104 mmol/L (ref 101–111)
Creatinine, Ser: 0.96 mg/dL (ref 0.61–1.24)
GFR calc non Af Amer: >60 mL/min (ref >60)
GLUCOSE: 218 mg/dL — AB (ref 65–99)
Potassium: 4.1 mmol/L (ref 3.5–5.1)
SODIUM: 133 mmol/L — AB (ref 135–145)
TOTAL PROTEIN: 6.7 g/dL (ref 6.5–8.1)
Total Bilirubin: 0.5 mg/dL (ref 0.3–1.2)

## 2015-11-13 LAB — LACTATE DEHYDROGENASE: LDH: 138 U/L (ref 98–192)

## 2015-11-13 NOTE — Progress Notes (Signed)
States is feeling well. Offers no complaints. 

## 2016-09-25 ENCOUNTER — Ambulatory Visit (INDEPENDENT_AMBULATORY_CARE_PROVIDER_SITE_OTHER): Payer: Medicare Other | Admitting: Vascular Surgery

## 2016-09-25 ENCOUNTER — Encounter (INDEPENDENT_AMBULATORY_CARE_PROVIDER_SITE_OTHER): Payer: Self-pay | Admitting: Vascular Surgery

## 2016-09-25 VITALS — BP 142/72 | HR 67 | Resp 16 | Ht 70.0 in | Wt 196.0 lb

## 2016-09-25 DIAGNOSIS — M79604 Pain in right leg: Secondary | ICD-10-CM | POA: Diagnosis not present

## 2016-09-25 DIAGNOSIS — I219 Acute myocardial infarction, unspecified: Secondary | ICD-10-CM

## 2016-09-25 DIAGNOSIS — I480 Paroxysmal atrial fibrillation: Secondary | ICD-10-CM | POA: Diagnosis not present

## 2016-09-25 DIAGNOSIS — I10 Essential (primary) hypertension: Secondary | ICD-10-CM

## 2016-09-25 DIAGNOSIS — I739 Peripheral vascular disease, unspecified: Secondary | ICD-10-CM | POA: Diagnosis not present

## 2016-09-25 DIAGNOSIS — M79605 Pain in left leg: Secondary | ICD-10-CM | POA: Diagnosis not present

## 2016-09-25 DIAGNOSIS — M79606 Pain in leg, unspecified: Secondary | ICD-10-CM | POA: Insufficient documentation

## 2016-09-25 NOTE — Progress Notes (Signed)
MRN : [id number]  Joseph Flowers is a 83 y.o. (1928-01-01) male who presents with chief complaint of leg pain.  History of Present Illness: The patient is seen for evaluation of painful lower extremities. Patient notes the pain is variable and not always associated with activity.  The pain is somewhat consistent day to day occurring on most days. The patient notes the pain also occurs with standing and routinely seems worse as the day wears on. The pain has been progressive over the past several years. The patient states these symptoms are causing  a profound negative impact on quality of life and daily activities.  The patient denies rest pain or dangling of an extremity off the side of the bed during the night for relief. No open wounds or sores at this time. No history of DVT or phlebitis. No prior interventions or surgeries.  There is a  history of back problems and DJD of the lumbar and sacral spine.    No outpatient prescriptions have been marked as taking for the 09/25/16 encounter (Appointment) with Delana Meyer, Dolores Lory, MD.    Past Medical History:  Diagnosis Date  . Atrial fibrillation ([place])    a. first diagnosed 06/2015; b. CHADS2VASc => 6 (CHF, HTN, age x 2, DM, vascualr disease)  . 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 Coronary artery disease involving native heart without angina pectoris 2002   s/p CABG x 5 (Dr. Roxy Manns  . Diabetes mellitus without complication ([place])   . Diastolic CHF ([place]) 9179  . Hyperlipemia   . Hypertension   . S/P CABG x 5 2002   Dr. Roxy Manns    Past Surgical History:  Procedure Laterality Date  . CORONARY ARTERY BYPASS GRAFT    . ENDOSCOPIC RETROGRADE CHOLANGIOPANCREATOGRAPHY (ERCP) WITH PROPOFOL N/A 07/16/2015   Procedure: ENDOSCOPIC RETROGRADE CHOLANGIOPANCREATOGRAPHY (ERCP) WITH PROPOFOL;  Surgeon: Lucilla Lame, MD;  Location: [facility] ENDOSCOPY;  Service: Endoscopy;  Laterality: N/A;    Social History Social History  Substance Use Topics    . Smoking status: Never Smoker  . Smokeless tobacco: Never Used  . Alcohol use No    Family History Family History  Problem Relation Age of Onset  . Cancer Mother   . Cancer Father   No family history of bleeding/clotting disorders, porphyria or autoimmune disease   No Known Allergies   REVIEW OF SYSTEMS (Negative unless checked)  Constitutional: [] Weight loss  [] Fever  [] Chills Cardiac: [] Chest pain   [] Chest pressure   [] Palpitations   [] Shortness of breath when laying flat   [] Shortness of breath with exertion. Vascular:  [x] Pain in legs with walking   [x] Pain in legs at rest  [] History of DVT   [] Phlebitis   [] Swelling in legs   [] Varicose veins   [] Non-healing ulcers Pulmonary:   [] Uses home oxygen   [] Productive cough   [] Hemoptysis   [] Wheeze  [] COPD   [] Asthma Neurologic:  [] Dizziness   [] Seizures   [] History of stroke   [] History of TIA  [] Aphasia   [] Vissual changes   [] Weakness or numbness in arm   [] Weakness or numbness in leg Musculoskeletal:   [] Joint swelling   [] Joint pain   [] Low back pain Hematologic:  [] Easy bruising  [] Easy bleeding   [] Hypercoagulable state   [] Anemic Gastrointestinal:  [] Diarrhea   [] Vomiting  [] Gastroesophageal reflux/heartburn   [] Difficulty swallowing. Genitourinary:  [] Chronic kidney disease   [] Difficult urination  [] Frequent urination   [] Blood in urine Skin:  [] Rashes   []   Ulcers  Psychological:  [] History of anxiety   []  History of major depression.  Physical Examination  There were no vitals filed for this visit. There is no height or weight on file to calculate BMI. Gen: WD/WN, NAD Head: [place]/AT, No temporalis wasting.  Ear/Nose/Throat: Hearing grossly intact, nares w/o erythema or drainage, poor dentition Eyes: PER, EOMI, sclera nonicteric.  Neck: Supple, no masses.  No bruit or JVD.  Pulmonary:  Good air movement, clear to auscultation bilaterally, no use of accessory muscles.  Cardiac: RRR, normal S1, S2, no  Murmurs. Vascular:  Vessel Right Left  Radial Palpable Palpable  PT Not Palpable Not Palpable  DP Not Palpable Not Palpable  Gastrointestinal: soft, non-distended. No guarding/no peritoneal signs.  Musculoskeletal: M/S 5/5 throughout.  No deformity or atrophy.  Neurologic: CN 2-12 intact. Pain and light touch intact in extremities.  Symmetrical.  Speech is fluent. Motor exam as listed above. Psychiatric: Judgment intact, Mood & affect appropriate for pt's clinical situation. Dermatologic: No rashes or ulcers noted.  No changes consistent with cellulitis. Lymph : No Cervical lymphadenopathy, no lichenification or skin changes of chronic lymphedema.  CBC Lab Results  Component Value Date   WBC 5.8 11/13/2015   HGB 13.5 11/13/2015   HCT 38.4 (L) 11/13/2015   MCV 86.8 11/13/2015   PLT 205 11/13/2015    BMET    Component Value Date/Time   NA 133 (L) 11/13/2015 0951   NA 137 08/09/2012 1636   K 4.1 11/13/2015 0951   K 4.1 08/09/2012 1636   CL 104 11/13/2015 0951   CL 105 08/09/2012 1636   CO2 24 11/13/2015 0951   CO2 26 08/09/2012 1636   GLUCOSE 218 (H) 11/13/2015 0951   GLUCOSE 148 (H) 08/09/2012 1636   BUN 14 11/13/2015 0951   BUN 17 08/09/2012 1636   CREATININE 0.96 11/13/2015 0951   CREATININE 1.28 09/30/2013 1517   CALCIUM 9.1 11/13/2015 0951   CALCIUM 9.5 08/09/2012 1636   GFRNONAA >60 11/13/2015 0951   GFRNONAA 51 (L) 09/30/2013 1517   GFRAA >60 11/13/2015 0951   GFRAA 60 (L) 09/30/2013 1517   CrCl cannot be calculated (Patient's most recent lab result is older than the maximum 21 days allowed.).  COAG Lab Results  Component Value Date   INR 1.16 07/15/2015   INR 0.98 08/18/2014   INR 0.9 08/09/2012    Radiology No results found.   Assessment/Plan 1. PAD (peripheral artery disease) (HCC)  Recommend:  The patient has atypical pain symptoms for pure atherosclerotic disease. However, on physical exam there is evidence of mixed venous and arterial  disease, given the diminished pulses and the edema associated with venous changes of the legs.  Noninvasive studies including ABI's and venous ultrasound of the legs will be obtained and the patient will follow up with me to review these studies.  The patient should continue walking and begin a more formal exercise program. The patient should continue his antiplatelet therapy and aggressive treatment of the lipid abnormalities.  The patient should begin wearing graduated compression socks 15-20 mmHg strength to control edema.   2. Essential hypertension Continue antihypertensive medications as already ordered, these medications have been reviewed and there are no changes at this time.   3. Paroxysmal atrial fibrillation (HCC) Continue antiarrhythmia medications as already ordered, these medications have been reviewed and there are no changes at this time.  Continue anticoagulation as ordered by Cardiology Service   4. Myocardial infarction, unspecified MI type, unspecified artery ([place]) Continue  cardiac and antihypertensive medications as already ordered and reviewed, no changes at this time.  Continue statin as ordered and reviewed, no changes at this time  Nitrates PRN for chest pain     Hortencia Pilar, MD  09/25/2016 9:23 AM

## 2016-10-20 ENCOUNTER — Other Ambulatory Visit (INDEPENDENT_AMBULATORY_CARE_PROVIDER_SITE_OTHER): Payer: Self-pay | Admitting: Vascular Surgery

## 2016-10-20 DIAGNOSIS — I7090 Unspecified atherosclerosis: Secondary | ICD-10-CM

## 2016-10-22 ENCOUNTER — Other Ambulatory Visit (INDEPENDENT_AMBULATORY_CARE_PROVIDER_SITE_OTHER): Payer: Medicare Other

## 2016-10-22 ENCOUNTER — Other Ambulatory Visit (INDEPENDENT_AMBULATORY_CARE_PROVIDER_SITE_OTHER): Payer: Self-pay | Admitting: Vascular Surgery

## 2016-10-22 ENCOUNTER — Ambulatory Visit (INDEPENDENT_AMBULATORY_CARE_PROVIDER_SITE_OTHER): Payer: Medicare Other

## 2016-10-22 DIAGNOSIS — I7090 Unspecified atherosclerosis: Secondary | ICD-10-CM

## 2016-10-22 DIAGNOSIS — I739 Peripheral vascular disease, unspecified: Secondary | ICD-10-CM | POA: Diagnosis not present

## 2016-10-28 ENCOUNTER — Other Ambulatory Visit (INDEPENDENT_AMBULATORY_CARE_PROVIDER_SITE_OTHER): Payer: Self-pay | Admitting: Vascular Surgery

## 2016-10-28 DIAGNOSIS — R52 Pain, unspecified: Secondary | ICD-10-CM

## 2016-10-30 ENCOUNTER — Ambulatory Visit (INDEPENDENT_AMBULATORY_CARE_PROVIDER_SITE_OTHER): Payer: Medicare Other | Admitting: Vascular Surgery

## 2016-10-30 ENCOUNTER — Encounter (INDEPENDENT_AMBULATORY_CARE_PROVIDER_SITE_OTHER): Payer: Self-pay | Admitting: Vascular Surgery

## 2016-10-30 ENCOUNTER — Ambulatory Visit (INDEPENDENT_AMBULATORY_CARE_PROVIDER_SITE_OTHER): Payer: Medicare Other

## 2016-10-30 VITALS — BP 141/72 | HR 75 | Resp 14 | Ht 70.0 in | Wt 196.0 lb

## 2016-10-30 DIAGNOSIS — I890 Lymphedema, not elsewhere classified: Secondary | ICD-10-CM

## 2016-10-30 DIAGNOSIS — I872 Venous insufficiency (chronic) (peripheral): Secondary | ICD-10-CM | POA: Diagnosis not present

## 2016-10-30 DIAGNOSIS — I480 Paroxysmal atrial fibrillation: Secondary | ICD-10-CM | POA: Diagnosis not present

## 2016-10-30 DIAGNOSIS — I219 Acute myocardial infarction, unspecified: Secondary | ICD-10-CM

## 2016-10-30 DIAGNOSIS — R52 Pain, unspecified: Secondary | ICD-10-CM | POA: Diagnosis not present

## 2016-10-30 DIAGNOSIS — I10 Essential (primary) hypertension: Secondary | ICD-10-CM | POA: Diagnosis not present

## 2016-10-30 DIAGNOSIS — E119 Type 2 diabetes mellitus without complications: Secondary | ICD-10-CM | POA: Diagnosis not present

## 2016-11-02 DIAGNOSIS — I872 Venous insufficiency (chronic) (peripheral): Secondary | ICD-10-CM | POA: Insufficient documentation

## 2016-11-02 DIAGNOSIS — I890 Lymphedema, not elsewhere classified: Secondary | ICD-10-CM | POA: Insufficient documentation

## 2016-11-02 NOTE — Progress Notes (Signed)
MRN : [id number]  Joseph Flowers is a 83 y.o. ([birth year]) male who presents with chief complaint of  Chief Complaint  Patient presents with  . Follow-up    pt conv ble ven dvt  .  History of Present Illness: The patient returns to the office for followup evaluation regarding leg swelling.  The swelling has persisted and the pain associated with swelling continues. There have not been any interval development of a ulcerations or wounds.  Since the previous visit the patient has been wearing graduated compression stockings and has noted little if any improvement in the lymphedema. The patient has been using compression routinely morning until night.  The patient also states elevation during the day and exercise is being done too.   Current Meds  Medication Sig  . aspirin 325 MG tablet Take 162.5 mg by mouth 2 (two) times daily.   . finasteride (PROSCAR) 5 MG tablet Take 5 mg by mouth daily.   . furosemide (LASIX) 20 MG tablet Take 20 mg by mouth daily.   Marland Kitchen glimepiride (AMARYL) 1 MG tablet   . glimepiride (AMARYL) 4 MG tablet Take 4 mg by mouth daily with breakfast.  . metFORMIN (GLUCOPHAGE) 500 MG tablet Take 500 mg by mouth daily with breakfast.   . metoprolol succinate (TOPROL-XL) 50 MG 24 hr tablet Take 1 tablet (50 mg total) by mouth 2 (two) times daily. Take with or immediately following a meal. (Patient taking differently: Take 50 mg by mouth. Take with or immediately following a meal.)  . ramipril (ALTACE) 10 MG capsule Take 10 mg by mouth daily.   . simvastatin (ZOCOR) 20 MG tablet Take 20 mg by mouth at bedtime.     Past Medical History:  Diagnosis Date  . Atrial fibrillation ([place])    a. first diagnosed 06/2015; b. CHADS2VASc => 6 (CHF, HTN, age x 2, DM, vascualr disease)  . 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 Coronary artery disease involving native heart without angina pectoris 2002   s/p CABG x 5 (Dr. Roxy Manns  . Diabetes mellitus without complication ([place])     . Diastolic CHF ([place]) 9518  . Hyperlipemia   . Hypertension   . S/P CABG x 5 2002   Dr. Roxy Manns    Past Surgical History:  Procedure Laterality Date  . CORONARY ARTERY BYPASS GRAFT    . ENDOSCOPIC RETROGRADE CHOLANGIOPANCREATOGRAPHY (ERCP) WITH PROPOFOL N/A 07/16/2015   Procedure: ENDOSCOPIC RETROGRADE CHOLANGIOPANCREATOGRAPHY (ERCP) WITH PROPOFOL;  Surgeon: Lucilla Lame, MD;  Location: [facility] ENDOSCOPY;  Service: Endoscopy;  Laterality: N/A;    Social History Social History  Substance Use Topics  . Smoking status: Never Smoker  . Smokeless tobacco: Never Used  . Alcohol use No    Family History Family History  Problem Relation Age of Onset  . Cancer Mother   . Cancer Father     No Known Allergies   REVIEW OF SYSTEMS (Negative unless checked)  Constitutional: [] Weight loss  [] Fever  [] Chills Cardiac: [] Chest pain   [] Chest pressure   [] Palpitations   [] Shortness of breath when laying flat   [] Shortness of breath with exertion. Vascular:  [x] Pain in legs with walking   [] Pain in legs at rest  [] History of DVT   [] Phlebitis   [x] Swelling in legs   [] Varicose veins   [] Non-healing ulcers Pulmonary:   [] Uses home oxygen   [] Productive cough   [] Hemoptysis   [] Wheeze  [] COPD   [] Asthma Neurologic:  [] Dizziness   []   Seizures   [] History of stroke   [] History of TIA  [] Aphasia   [] Vissual changes   [] Weakness or numbness in arm   [] Weakness or numbness in leg Musculoskeletal:   [] Joint swelling   [] Joint pain   [] Low back pain Hematologic:  [] Easy bruising  [] Easy bleeding   [] Hypercoagulable state   [] Anemic Gastrointestinal:  [] Diarrhea   [] Vomiting  [] Gastroesophageal reflux/heartburn   [] Difficulty swallowing. Genitourinary:  [] Chronic kidney disease   [] Difficult urination  [] Frequent urination   [] Blood in urine Skin:  [] Rashes   [] Ulcers  Psychological:  [] History of anxiety   []  History of major depression.  Physical Examination  Vitals:   10/30/16 1550  BP: (!) 141/72   Pulse: 75  Resp: 14  Weight: 88.9 kg (196 lb)  Height: 5\' 10"  (1.778 m)   Body mass index is 28.12 kg/m. Gen: WD/WN, NAD Head: [place]/AT, No temporalis wasting.  Ear/Nose/Throat: Hearing grossly intact, nares w/o erythema or drainage Eyes: PER, EOMI, sclera nonicteric.  Neck: Supple, no large masses.   Pulmonary:  Good air movement, no audible wheezing bilaterally, no use of accessory muscles.  Cardiac: RRR, no JVD Vascular: moderate venous stasis changes to the legs bilaterally.  2+ soft pitting edema Vessel Right Left  Radial Palpable Palpable  PT Not Palpable Not Palpable  DP Not Palpable Not Palpable  Gastrointestinal: Non-distended. No guarding/no peritoneal signs.  Musculoskeletal: M/S 5/5 throughout.  No deformity or atrophy.  Neurologic: CN 2-12 intact. Symmetrical.  Speech is fluent. Motor exam as listed above. Psychiatric: Judgment intact, Mood & affect appropriate for pt's clinical situation. Dermatologic: venous  rashes no ulcers noted.  No changes consistent with cellulitis. Lymph : No lichenification or skin changes of chronic lymphedema.  CBC Lab Results  Component Value Date   WBC 5.8 11/13/2015   HGB 13.5 11/13/2015   HCT 38.4 (L) 11/13/2015   MCV 86.8 11/13/2015   PLT 205 11/13/2015    BMET    Component Value Date/Time   NA 133 (L) 11/13/2015 0951   NA 137 08/09/2012 1636   K 4.1 11/13/2015 0951   K 4.1 08/09/2012 1636   CL 104 11/13/2015 0951   CL 105 08/09/2012 1636   CO2 24 11/13/2015 0951   CO2 26 08/09/2012 1636   GLUCOSE 218 (H) 11/13/2015 0951   GLUCOSE 148 (H) 08/09/2012 1636   BUN 14 11/13/2015 0951   BUN 17 08/09/2012 1636   CREATININE 0.96 11/13/2015 0951   CREATININE 1.28 09/30/2013 1517   CALCIUM 9.1 11/13/2015 0951   CALCIUM 9.5 08/09/2012 1636   GFRNONAA >60 11/13/2015 0951   GFRNONAA 51 (L) 09/30/2013 1517   GFRAA >60 11/13/2015 0951   GFRAA 60 (L) 09/30/2013 1517   CrCl cannot be calculated (Patient's most recent lab result  is older than the maximum 21 days allowed.).  COAG Lab Results  Component Value Date   INR 1.16 07/15/2015   INR 0.98 08/18/2014   INR 0.9 08/09/2012    Radiology No results found.  Assessment/Plan 1. Lymphedema  No surgery or intervention at this point in time.    I have reviewed my previous discussion with the patient regarding swelling and why it  causes symptoms.  The patient is doing well with compression and will continue wearing graduated compression stockings class 1 (20-30 mmHg) on a daily basis a prescription was given. The patient will  continue wearing the stockings first thing in the morning and removing them in the evening. The patient is instructed  specifically not to sleep in the stockings.    In addition, behavioral modification including elevation during the day and exercise will be continued.    2. Chronic venous insufficiency No surgery or intervention at this point in time.    I have had a long discussion with the patient regarding venous insufficiency and why it  causes symptoms. I have discussed with the patient the chronic skin changes that accompany venous insufficiency and the long term sequela such as infection and ulceration.  Patient will begin wearing graduated compression stockings class 1 (20-30 mmHg) or compression wraps on a daily basis a prescription was given. The patient will put the stockings on first thing in the morning and removing them in the evening. The patient is instructed specifically not to sleep in the stockings.    In addition, behavioral modification including several periods of elevation of the lower extremities during the day will be continued. I have demonstrated that proper elevation is a position with the ankles at heart level.  The patient is instructed to begin routine exercise, especially walking on a daily basis  Following the review of the ultrasound the patient will follow up in 2-3 months to reassess the degree of swelling  and the control that graduated compression stockings or compression wraps  is offering.   The patient can be assessed for a Lymph Pump at that time  3. Paroxysmal atrial fibrillation (HCC) Continue antiarrhythmia medications as already ordered, these medications have been reviewed and there are no changes at this time.  Continue anticoagulation as ordered by Cardiology Service   4. Essential hypertension Continue antihypertensive medications as already ordered, these medications have been reviewed and there are no changes at this time.   5. Myocardial infarction, unspecified MI type, unspecified artery ([place]) Continue cardiac and antihypertensive medications as already ordered and reviewed, no changes at this time.  Continue statin as ordered and reviewed, no changes at this time  Nitrates PRN for chest pain   6. Controlled type 2 diabetes mellitus without complication, unspecified whether long term insulin use ([place]) Continue hypoglycemic medications as already ordered, these medications have been reviewed and there are no changes at this time.  Hgb A1C to be monitored as already arranged by primary service     Hortencia Pilar, MD  11/02/2016 4:43 PM

## 2016-12-01 ENCOUNTER — Ambulatory Visit (INDEPENDENT_AMBULATORY_CARE_PROVIDER_SITE_OTHER): Payer: Medicare Other | Admitting: Vascular Surgery

## 2018-06-14 ENCOUNTER — Telehealth: Payer: Self-pay | Admitting: Plastic Surgery

## 2018-06-14 NOTE — Telephone Encounter (Signed)
Called patient to confirm appointment scheduled for tomorrow. Patient answered the following questions: °1.Has the patient traveled outside of the state of [place] at all within the past 6 weeks? No °2.Does the patient have a fever or cough at all? No °3.Has the patient been tested for COVID? Had a positive COVID test? No °4. Has the patient been in contact with anyone who has tested positive? No ° °

## 2018-06-15 ENCOUNTER — Other Ambulatory Visit: Payer: Self-pay

## 2018-06-15 ENCOUNTER — Encounter: Payer: Self-pay | Admitting: Plastic Surgery

## 2018-06-15 ENCOUNTER — Ambulatory Visit: Payer: Medicare Other | Admitting: Plastic Surgery

## 2018-06-15 DIAGNOSIS — C44719 Basal cell carcinoma of skin of left lower limb, including hip: Secondary | ICD-10-CM | POA: Diagnosis not present

## 2018-06-15 DIAGNOSIS — C44310 Basal cell carcinoma of skin of unspecified parts of face: Secondary | ICD-10-CM

## 2018-06-15 DIAGNOSIS — C44711 Basal cell carcinoma of skin of unspecified lower limb, including hip: Secondary | ICD-10-CM | POA: Insufficient documentation

## 2018-06-15 NOTE — Progress Notes (Signed)
Patient ID: Joseph Flowers, male    DOB: 10/10/1927, 85 y.o.   MRN: [id number]   Chief Complaint  Patient presents with  . Skin Problem    The patient is an 85 year old male here for evaluation of his leg and nose.  He had a biopsy of his nose and of his left leg and both were found to be basal cell carcinoma he noticed a scabbing and ulceration in both areas.  His nose now is red and has a scab at the tip.  His left leg has a 5 x 5 cm area that is raised and ulcerating.  He has a long history of skin cancer.  He also has a history of hyperlipidemia cardiac disease and diabetes.  He is being treated for all of these.  He lives in [place].   Review of Systems  Constitutional: Negative for appetite change and fatigue.  HENT: Negative.   Eyes: Negative.   Respiratory: Negative for chest tightness and shortness of breath.   Cardiovascular: Positive for leg swelling.  Gastrointestinal: Negative.   Genitourinary: Negative.   Musculoskeletal: Negative.   Psychiatric/Behavioral: Negative.     Past Medical History:  Diagnosis Date  . Atrial fibrillation ([place])    a. first diagnosed 06/2015; b. CHADS2VASc => 6 (CHF, HTN, age x 2, DM, vascualr disease)  . 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 Coronary artery disease involving native heart without angina pectoris 2002   s/p CABG x 5 (Dr. Roxy Manns  . Diabetes mellitus without complication ([place])   . Diastolic CHF ([place]) 5361  . Hyperlipemia   . Hypertension   . S/P CABG x 5 2002   Dr. Roxy Manns    Past Surgical History:  Procedure Laterality Date  . CORONARY ARTERY BYPASS GRAFT    . ENDOSCOPIC RETROGRADE CHOLANGIOPANCREATOGRAPHY (ERCP) WITH PROPOFOL N/A 07/16/2015   Procedure: ENDOSCOPIC RETROGRADE CHOLANGIOPANCREATOGRAPHY (ERCP) WITH PROPOFOL;  Surgeon: Lucilla Lame, MD;  Location: [facility] ENDOSCOPY;  Service: Endoscopy;  Laterality: N/A;      Current Outpatient Medications:  .  aspirin 325 MG tablet, Take 162.5 mg by mouth 2 (two)  times daily. , Disp: , Rfl:  .  finasteride (PROSCAR) 5 MG tablet, Take 5 mg by mouth daily. , Disp: , Rfl:  .  furosemide (LASIX) 20 MG tablet, Take 20 mg by mouth daily. , Disp: , Rfl:  .  glimepiride (AMARYL) 1 MG tablet, , Disp: , Rfl:  .  glimepiride (AMARYL) 4 MG tablet, Take 4 mg by mouth daily with breakfast., Disp: , Rfl:  .  metFORMIN (GLUCOPHAGE) 500 MG tablet, Take 500 mg by mouth daily with breakfast. , Disp: , Rfl:  .  metoprolol succinate (TOPROL-XL) 50 MG 24 hr tablet, Take 1 tablet (50 mg total) by mouth 2 (two) times daily. Take with or immediately following a meal. (Patient taking differently: Take 50 mg by mouth. Take with or immediately following a meal.), Disp: 60 tablet, Rfl: 0 .  ramipril (ALTACE) 10 MG capsule, Take 10 mg by mouth daily. , Disp: , Rfl:  .  simvastatin (ZOCOR) 20 MG tablet, Take 20 mg by mouth at bedtime. , Disp: , Rfl:    Objective:   Vitals:   06/15/18 1429  BP: 113/67  Pulse: 79  Temp: 97.6 F (36.4 C)  SpO2: 96%    Physical Exam Vitals signs and nursing note reviewed.  HENT:     Head: Normocephalic.   Pulmonary:  Effort: Pulmonary effort is normal. No respiratory distress.  Abdominal:     General: Abdomen is flat. There is no distension.     Tenderness: There is no abdominal tenderness.  Musculoskeletal:       Legs:  Skin:    General: Skin is warm.  Neurological:     Mental Status: He is alert and oriented to person, place, and time.  Psychiatric:        Mood and Affect: Mood normal.        Behavior: Behavior normal.     Assessment & Plan:  Basal cell carcinoma of face  Basal cell carcinoma (BCC) of left lower extremity Recommend excision of left leg basal cell carcinoma and nose tip basal cell carcinoma with possible ACell placement and later skin graft. Pictures taken and placed in chart with patient permission. Call placed to Dr. Velna Ochs in [place] to discuss the case.  He is the patient's PCP.   [place], DO

## 2018-06-30 ENCOUNTER — Telehealth: Payer: Self-pay | Admitting: Plastic Surgery

## 2018-06-30 NOTE — Telephone Encounter (Signed)
Called patient to confirm appointment scheduled for tomorrow. Patient's daughter answered the following questions: 1.Has the patient traveled outside of the state of [place] at all within the past 6 weeks? No 2.Does the patient have a fever or cough at all? No 3.Has the patient been tested for COVID? Had a positive COVID test? No 4. Has the patient been in contact with anyone who has tested positive? No

## 2018-07-01 ENCOUNTER — Other Ambulatory Visit: Payer: Self-pay

## 2018-07-01 ENCOUNTER — Ambulatory Visit (INDEPENDENT_AMBULATORY_CARE_PROVIDER_SITE_OTHER): Payer: Medicare Other | Admitting: Plastic Surgery

## 2018-07-01 ENCOUNTER — Encounter: Payer: Self-pay | Admitting: Plastic Surgery

## 2018-07-01 VITALS — BP 145/75 | HR 74 | Temp 97.5°F | Ht 70.0 in | Wt 186.0 lb

## 2018-07-01 DIAGNOSIS — C44310 Basal cell carcinoma of skin of unspecified parts of face: Secondary | ICD-10-CM

## 2018-07-01 DIAGNOSIS — C44719 Basal cell carcinoma of skin of left lower limb, including hip: Secondary | ICD-10-CM

## 2018-07-01 MED ORDER — CEPHALEXIN 500 MG PO CAPS: 500.0000 mg | ORAL_CAPSULE | Freq: Four times a day (QID) | ORAL | 0 refills | Status: AC

## 2018-07-01 MED ORDER — HYDROCODONE-ACETAMINOPHEN 5-325 MG PO TABS: 1.0000 | ORAL_TABLET | Freq: Two times a day (BID) | ORAL | 0 refills | Status: AC | PRN

## 2018-07-01 NOTE — H&P (View-Only) (Signed)
Patient ID: Joseph Flowers, male    DOB: Jun 18, 1927, 85 y.o.   MRN: [id number]   Chief Complaint  Patient presents with  . Skin Problem    The patient is an 85 yrs old male here for his preoperative history and physical for surgery on his leg and nose. He has a biopsy proven skin cancer on his nose and left leg that was reported as basal cell carcinoma.  We are planning for excision.  He does not have any new symptoms or concerns.  The leg lesion is ~ 5 x 5 cm and is ulcerating.    Review of Systems  Constitutional: Negative.  Negative for activity change and appetite change.  Eyes: Negative.   Respiratory: Negative for chest tightness.   Cardiovascular: Positive for leg swelling.  Gastrointestinal: Negative for abdominal pain.  Genitourinary: Negative.   Skin: Positive for wound.  Psychiatric/Behavioral: Negative.     Past Medical History:  Diagnosis Date  . Atrial fibrillation ([place])    a. first diagnosed 06/2015; b. CHADS2VASc => 6 (CHF, HTN, age x 2, DM, vascualr disease)  . 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 Coronary artery disease involving native heart without angina pectoris 2002   s/p CABG x 5 (Dr. Roxy Manns  . Diabetes mellitus without complication ([place])   . Diastolic CHF ([place]) 2683  . Hyperlipemia   . Hypertension   . S/P CABG x 5 2002   Dr. Roxy Manns    Past Surgical History:  Procedure Laterality Date  . CORONARY ARTERY BYPASS GRAFT    . ENDOSCOPIC RETROGRADE CHOLANGIOPANCREATOGRAPHY (ERCP) WITH PROPOFOL N/A 07/16/2015   Procedure: ENDOSCOPIC RETROGRADE CHOLANGIOPANCREATOGRAPHY (ERCP) WITH PROPOFOL;  Surgeon: Lucilla Lame, MD;  Location: [facility] ENDOSCOPY;  Service: Endoscopy;  Laterality: N/A;      Current Outpatient Medications:  .  aspirin 325 MG tablet, Take 162.5 mg by mouth 2 (two) times daily. , Disp: , Rfl:  .  finasteride (PROSCAR) 5 MG tablet, Take 5 mg by mouth daily. , Disp: , Rfl:  .  furosemide (LASIX) 20 MG tablet, Take 20 mg by mouth daily. ,  Disp: , Rfl:  .  glimepiride (AMARYL) 1 MG tablet, , Disp: , Rfl:  .  glimepiride (AMARYL) 4 MG tablet, Take 4 mg by mouth daily with breakfast., Disp: , Rfl:  .  metFORMIN (GLUCOPHAGE) 500 MG tablet, Take 500 mg by mouth daily with breakfast. , Disp: , Rfl:  .  metoprolol succinate (TOPROL-XL) 50 MG 24 hr tablet, Take 1 tablet (50 mg total) by mouth 2 (two) times daily. Take with or immediately following a meal. (Patient taking differently: Take 50 mg by mouth. Take with or immediately following a meal.), Disp: 60 tablet, Rfl: 0 .  ramipril (ALTACE) 10 MG capsule, Take 10 mg by mouth daily. , Disp: , Rfl:  .  simvastatin (ZOCOR) 20 MG tablet, Take 20 mg by mouth at bedtime. , Disp: , Rfl:    Objective:   There were no vitals filed for this visit.  Physical Exam Constitutional:      Appearance: Normal appearance.  HENT:     Head: Normocephalic.  Cardiovascular:     Rate and Rhythm: Normal rate.  Pulmonary:     Effort: Pulmonary effort is normal. No respiratory distress.  Abdominal:     General: Abdomen is flat. There is no distension.  Neurological:     Mental Status: He is alert and oriented to person, place, and  time.  Psychiatric:        Mood and Affect: Mood normal.     Assessment & Plan:  Basal cell carcinoma of face  Basal cell carcinoma (BCC) of left lower extremity  Plan for excision of BCC nose and left leg with possible acell placement as a staged procedure. Once path is back we will plan reconstruction. Prescription sent to pharmacy.  [place], DO

## 2018-07-01 NOTE — Progress Notes (Signed)
Patient ID: Joseph Flowers, male    DOB: 07/21/1927, 85 y.o.   MRN: [id number]   Chief Complaint  Patient presents with  . Skin Problem    The patient is an 85 yrs old male here for his preoperative history and physical for surgery on his leg and nose. He has a biopsy proven skin cancer on his nose and left leg that was reported as basal cell carcinoma.  We are planning for excision.  He does not have any new symptoms or concerns.  The leg lesion is ~ 5 x 5 cm and is ulcerating.    Review of Systems  Constitutional: Negative.  Negative for activity change and appetite change.  Eyes: Negative.   Respiratory: Negative for chest tightness.   Cardiovascular: Positive for leg swelling.  Gastrointestinal: Negative for abdominal pain.  Genitourinary: Negative.   Skin: Positive for wound.  Psychiatric/Behavioral: Negative.     Past Medical History:  Diagnosis Date  . Atrial fibrillation ([place])    a. first diagnosed 06/2015; b. CHADS2VASc => 6 (CHF, HTN, age x 2, DM, vascualr disease)  . 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 Coronary artery disease involving native heart without angina pectoris 2002   s/p CABG x 5 (Dr. Roxy Manns  . Diabetes mellitus without complication ([place])   . Diastolic CHF ([place]) 6546  . Hyperlipemia   . Hypertension   . S/P CABG x 5 2002   Dr. Roxy Manns    Past Surgical History:  Procedure Laterality Date  . CORONARY ARTERY BYPASS GRAFT    . ENDOSCOPIC RETROGRADE CHOLANGIOPANCREATOGRAPHY (ERCP) WITH PROPOFOL N/A 07/16/2015   Procedure: ENDOSCOPIC RETROGRADE CHOLANGIOPANCREATOGRAPHY (ERCP) WITH PROPOFOL;  Surgeon: Lucilla Lame, MD;  Location: [facility] ENDOSCOPY;  Service: Endoscopy;  Laterality: N/A;      Current Outpatient Medications:  .  aspirin 325 MG tablet, Take 162.5 mg by mouth 2 (two) times daily. , Disp: , Rfl:  .  finasteride (PROSCAR) 5 MG tablet, Take 5 mg by mouth daily. , Disp: , Rfl:  .  furosemide (LASIX) 20 MG tablet, Take 20 mg by mouth daily. ,  Disp: , Rfl:  .  glimepiride (AMARYL) 1 MG tablet, , Disp: , Rfl:  .  glimepiride (AMARYL) 4 MG tablet, Take 4 mg by mouth daily with breakfast., Disp: , Rfl:  .  metFORMIN (GLUCOPHAGE) 500 MG tablet, Take 500 mg by mouth daily with breakfast. , Disp: , Rfl:  .  metoprolol succinate (TOPROL-XL) 50 MG 24 hr tablet, Take 1 tablet (50 mg total) by mouth 2 (two) times daily. Take with or immediately following a meal. (Patient taking differently: Take 50 mg by mouth. Take with or immediately following a meal.), Disp: 60 tablet, Rfl: 0 .  ramipril (ALTACE) 10 MG capsule, Take 10 mg by mouth daily. , Disp: , Rfl:  .  simvastatin (ZOCOR) 20 MG tablet, Take 20 mg by mouth at bedtime. , Disp: , Rfl:    Objective:   There were no vitals filed for this visit.  Physical Exam Constitutional:      Appearance: Normal appearance.  HENT:     Head: Normocephalic.  Cardiovascular:     Rate and Rhythm: Normal rate.  Pulmonary:     Effort: Pulmonary effort is normal. No respiratory distress.  Abdominal:     General: Abdomen is flat. There is no distension.  Neurological:     Mental Status: He is alert and oriented to person, place, and  time.  Psychiatric:        Mood and Affect: Mood normal.     Assessment & Plan:  Basal cell carcinoma of face  Basal cell carcinoma (BCC) of left lower extremity  Plan for excision of BCC nose and left leg with possible acell placement as a staged procedure. Once path is back we will plan reconstruction. Prescription sent to pharmacy.  [place], DO

## 2018-07-05 ENCOUNTER — Telehealth: Payer: Self-pay | Admitting: Plastic Surgery

## 2018-07-05 NOTE — Telephone Encounter (Signed)
-----   Message from [place] sent at 07/05/2018 12:03 PM EDT ----- Hello Dr. Marla Roe.   I received a call from the patient's son requesting an order for the COVID  test to be at [facility]. He received a call to schedule the test but was told he had to have the test at [facility] unless there was an order from Dr. Marla Roe for it to be at [facility]. Please could you change the order to [facility]?   Thank you.

## 2018-07-06 ENCOUNTER — Telehealth: Payer: Self-pay | Admitting: Plastic Surgery

## 2018-07-06 NOTE — Telephone Encounter (Signed)
Received call from [facility] who stated they had received a phone call from the patient's son about scheduling COVID swab prior to patient's surgery. Leadership at [facility] stated that patient could have swab done at [facility] but could not complete blood work. Patient's son stated that he would like everything done at [facility] if possible. Informed [facility] that Dr. Marla Roe is currently in surgery as she is the only one who would be able to update orders, but will ask if the patient needs labs, and if so, where could possibly patient have them done. Patient lives in [place] and son has to provide all transportation.

## 2018-07-06 NOTE — Telephone Encounter (Signed)
Called and spoke with Doroteo Bradford, RN who confirmed that patient only needed COVID swab and not other blood work at this time. Returned call to [place] at [facility] to confirm only COVID swab at this time. Will call patient's son with COVID swab appointment.

## 2018-07-07 NOTE — Pre-Procedure Instructions (Signed)
Joseph Flowers  07/07/2018      [facility] [address]), [place] [address] [place] ([place]) [place] [address] Phone: [phone] Fax: [phone]    Your procedure is scheduled on Mon., Jul 12, 2018 from 11:26AM-12:55PM  Report to [facility] "A" at 9:25AM  Call this number if you have problems the morning of surgery:  [phone]   Remember:  Do not eat or drink after midnight on May 17th     Take these medicines the morning of surgery with A SIP OF WATER: Finasteride (PROSCAR) and Metoprolol succinate (TOPROL-XL)  Follow your surgeon's instructions on when to stop Aspirin.  If no instructions were given by your surgeon then you will need to call the office to get those instructions.  As of today, stop taking all Other Aspirin Products, Vitamins, Fish oils, and Herbal medications. Also stop all NSAIDS i.e. Advil, Ibuprofen, Motrin, Aleve, Anaprox, Naproxen, BC, Goody Powders, and all Supplements.     . Do not take Glimepiride (AMARYL) and MetFORMIN (GLUCOPHAGE)  the morning of surgery.   How to Manage Your Diabetes Before and After Surgery  Why is it important to control my blood sugar before and after surgery? . Improving blood sugar levels before and after surgery helps healing and can limit problems. . A way of improving blood sugar control is eating a healthy diet by: o  Eating less sugar and carbohydrates o  Increasing activity/exercise o  Talking with your doctor about reaching your blood sugar goals . High blood sugars (greater than 180 mg/dL) can raise your risk of infections and slow your recovery, so you will need to focus on controlling your diabetes during the weeks before surgery. . Make sure that the doctor who takes care of your diabetes knows about your planned surgery including the date and location.  How do I manage my blood sugar before surgery? . Check your blood sugar at least 4  times a day, starting 2 days before surgery, to make sure that the level is not too high or low. o Check your blood sugar the morning of your surgery when you wake up and every 2 hours until you get to the Short Stay unit. . If your blood sugar is less than 70 mg/dL, you will need to treat for low blood sugar: o Do not take insulin. o Treat a low blood sugar (less than 70 mg/dL) with  cup of clear juice (cranberry or apple), 4 glucose tablets, OR glucose gel. Recheck blood sugar in 15 minutes after treatment (to make sure it is greater than 70 mg/dL). If your blood sugar is not greater than 70 mg/dL on recheck, call [phone] o  for further instructions.                                                   . If your CBG is greater than 220 mg/dL, call the number above for further instructions.  . If you are admitted to the hospital after surgery: o Your blood sugar will be checked by the staff and you will probably be given insulin after surgery (instead of oral diabetes medicines) to make sure you have good blood sugar levels. o The goal for blood sugar control after surgery is 80-180 mg/dL.  Reviewed and Endorsed  by [facility] Patient Education Committee, August 2015   Special instructions:  [facility]- Preparing For Surgery  Before surgery, you can play an important role. Because skin is not sterile, your skin needs to be as free of germs as possible. You can reduce the number of germs on your skin by washing with CHG (chlorahexidine gluconate) Soap before surgery.  CHG is an antiseptic cleaner which kills germs and bonds with the skin to continue killing germs even after washing.    Please do not use if you have an allergy to CHG or antibacterial soaps. If your skin becomes reddened/irritated stop using the CHG.  Do not shave (including legs and underarms) for at least 48 hours prior to first CHG shower. It is OK to shave your face.  Please follow these instructions  carefully.   1. Shower the NIGHT BEFORE SURGERY and the MORNING OF SURGERY with CHG.   2. If you chose to wash your hair, wash your hair first as usual with your normal shampoo.  3. After you shampoo, rinse your hair and body thoroughly to remove the shampoo.  4. Use CHG as you would any other liquid soap. You can apply CHG directly to the skin and wash gently with a scrungie or a clean washcloth.   5. Apply the CHG Soap to your body ONLY FROM THE NECK DOWN.  Do not use on open wounds or open sores. Avoid contact with your eyes, ears, mouth and genitals (private parts). Wash Face and genitals (private parts)  with your normal soap.  6. Wash thoroughly, paying special attention to the area where your surgery will be performed.  7. Thoroughly rinse your body with warm water from the neck down.  8. DO NOT shower/wash with your normal soap after using and rinsing off the CHG Soap.  9. Pat yourself dry with a CLEAN TOWEL.  10. Wear CLEAN PAJAMAS to bed the night before surgery, wear comfortable clothes the morning of surgery  11. Place CLEAN SHEETS on your bed the night of your first shower and DO NOT SLEEP WITH PETS.   Day of Surgery:  Oral Hygiene is also important to reduce your risk of infection.  Remember - BRUSH YOUR TEETH THE MORNING OF SURGERY WITH YOUR REGULAR TOOTHPASTE    Do not wear jewelry.  Do not wear lotions, powders, colognes, or deodorant.  Do not shave 48 hours prior to surgery.  Men may shave face.  Do not bring valuables to the hospital.  [facility] is not responsible for any belongings or valuables.  Contacts, dentures or bridgework may not be worn into surgery.  .  For patients admitted to the hospital, discharge time will be determined by your treatment team.  Patients discharged the day of surgery will not be allowed to drive home.  Please wear clean clothes to the hospital/surgery center.    Please read over the following fact sheets that you were  given. Pain Booklet, Coughing and Deep Breathing and Surgical Site Infection Prevention

## 2018-07-08 ENCOUNTER — Other Ambulatory Visit: Payer: Self-pay

## 2018-07-08 ENCOUNTER — Encounter (HOSPITAL_COMMUNITY): Payer: Self-pay

## 2018-07-08 ENCOUNTER — Inpatient Hospital Stay (HOSPITAL_COMMUNITY): Admission: RE | Admit: 2018-07-08 | Payer: Medicare Other | Source: Ambulatory Visit

## 2018-07-08 ENCOUNTER — Other Ambulatory Visit
Admission: RE | Admit: 2018-07-08 | Discharge: 2018-07-08 | Disposition: A | Discharge status: Home / Self Care | Payer: Medicare Other | Source: Ambulatory Visit | Attending: Plastic Surgery | Admitting: Plastic Surgery

## 2018-07-08 ENCOUNTER — Inpatient Hospital Stay (HOSPITAL_COMMUNITY)
Admission: RE | Admit: 2018-07-08 | Discharge: 2018-07-08 | Disposition: A | Discharge status: Home / Self Care | Payer: Medicare Other | Source: Ambulatory Visit

## 2018-07-08 DIAGNOSIS — Z1159 Encounter for screening for other viral diseases: Secondary | ICD-10-CM | POA: Diagnosis present

## 2018-07-08 HISTORY — DX: Acute myocardial infarction, unspecified: I21.9

## 2018-07-08 HISTORY — DX: Unspecified hearing loss, unspecified ear: H91.90

## 2018-07-08 HISTORY — DX: Basal cell carcinoma of skin, unspecified: C44.91

## 2018-07-08 HISTORY — DX: Presence of spectacles and contact lenses: Z97.3

## 2018-07-08 NOTE — Progress Notes (Signed)
Pt stated that he was not aware of scheduled PAT appointment today and that he had no transportation. Pt denies SOB, chest pain, and being under the care of a cardiologist. Pt requested that nurse call his daughter, Ardean Larsen, to have her complete SDW-pre-op call. Daughter was unable to complete assessment for anesthesia complications, recent labs, EKG and chest x ray; please complete on DOS with pt . Daughter made aware to have pt stop taking  vitamins, fish oil and herbal medications. Do not take any NSAIDs ie: Ibuprofen, Advil, Naproxen (Aleve), Motrin, BC and Goody Powder. Daughter stated that she will contact father and/or surgeon to clarify pre-op Aspirin instructions.   Pt instructed to quarantine after COVID-19 test today.  Coronavirus Screening  Daughter denies that family members experienced the following symptoms:  Cough yes/no: No Fever (>100.30F)  yes/no: No Runny nose yes/no: No Sore throat yes/no: No Difficulty breathing/shortness of breath  yes/no: No  Have you or a family member traveled in the last 14 days and where? yes/no: No  Daughter made aware that hospital visitation restrictions are in effect and the importance of the restrictions.  Daughter verbalized understanding of all pre-op instructions. PA, Anesthesiology, asked to review pt history.

## 2018-07-09 ENCOUNTER — Telehealth: Payer: Self-pay

## 2018-07-09 ENCOUNTER — Telehealth: Payer: Self-pay | Admitting: Plastic Surgery

## 2018-07-09 LAB — NOVEL CORONAVIRUS, NAA (HOSP ORDER, SEND-OUT TO REF LAB; TAT 18-24 HRS): SARS-CoV-2, NAA: NOT DETECTED

## 2018-07-09 NOTE — Progress Notes (Signed)
Anesthesia Chart Review: SAME DAY WORK-UP (SDW)   Case:  [id number] Date/Time:  07/12/18 1111   Procedures:      Excision of Basal Cell Carcinoma of nose (N/A Nose)     Excision of Basal Cell Carcinoma of left leg with Acell Placement (Left Leg Lower)   Anesthesia type:  General   Pre-op diagnosis:  Basal Cell Carcinoma of nose and left leg   Location:  [facility] OR ROOM 08 / [place] [facility]   Surgeon:  Wallace Going, DO      DISCUSSION: Patient is an 85 year old male scheduled for the above procedure. He is a SDW as he was unable to get transportation to his 07/08/18 PAT appointment.  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   He denied chest pain, SOB per PAT phone RN. He has not seen a cardiologist since 2017 (see below). He is followed by his internist Baxter Hire, MD. Patient is on statin, ASA, beta blocker, and ACE-I, and diuretic.   Daughter has reached out to Dr. Marla Roe regarding preoperative ASA instructions.  He will need updated labs prior to surgery. Last labs ([place]) from 05/03/18 showed H/H 12.3/38.3, PLT 219, Cr 1.0, AST 21, ALT 15, A1c 7.0.  COVID test was negative on 07/08/18.  Above reviewed with anesthesiologist Roberts Gaudy, MD. Patient is 85 years old with need for skin cancer excision. Case is posted for GA, although anesthesiologist may consider if other anesthesia options appropriate. He does have known CAD but denied active symptoms during his recent PAT interview and March PCP follow-up. He is on appropriate medications for CAD. It is less clear if he has chronic afib or if more related to his acute illness in 2017. I left a message for his PCP Dr. Edwina Barth about surgery plans and inquiring if any additional input regarding cardiac history/testing and chronicity of afib. I received a phone call from his nurse Caryl Pina. She reported Dr. Edwina Barth has  only been patient's PCP for the past year, but confirmed that no known additional EKGs or cardiac testing/evaluation since 2017. Patient will be further assessed on the day of surgery by his anesthesia team with additional input at that time. He will need an EKG on arrival.    VS: Vitals at 05/19/18 PCP visit: BP 122/82, HR 102, O2 sat 99%, BMI 28.   PROVIDERS: Baxter Hire, MD is PCP [facility], see [facility]). Last seen on 05/19/18 for Medicare Annual Wellness visit. (Previously patient seeing Adrian Prows, MD at the same practice until 05/05/17.)  No active CAD symptoms at that time. Patient was referred to general surgery for biopsy of suspected skin cancer on left leg. - Hortencia Pilar, MD is vascular surgeon. Patient was seen in 2018 for lymphedema and chronic venous insufficiency and was prescribed compression stockings. - Last seen by cardiologist Ida Rogue, MD in 06/2015 during admission for new onset afib in the setting of acute illness (cholangitis with bacteremia). Troponin peak at 0.10, felt likely demand ischemia. Echo ordered, anticipate future ischemic evaluation. Although felt to be a potential candidate for anticoagulation, only ASA recommended at that time given potention need for surgery. Rate control recommended. Patient has had regular IM follow-up, but no additional cardiology visits seen since. - Last oncology visit seen was by Delight Hoh, MD on 11/13/15.   LABS: He is for updated labs on  the day of surgery. A1c 7.0 on 05/03/18.   EKG: Last EKG seen was on 07/18/15 showed afib at 87 bpm, right BBB, anterior infarct (age undetermined).   CV: Echo 07/18/15 (in setting of new onset afib, bacteremia): Study Conclusions - Procedure narrative: Transthoracic echocardiography. The study   was technically difficult, as a result of poor acoustic windows. - Left ventricle: The cavity size was normal. Systolic function was   normal. The estimated  ejection fraction was in the range of 50%   to 55%. Mild Hypokinesis of the mid to apical anterior and   anteroseptal myocardium. The study is not technically sufficient   to allow evaluation of LV diastolic function. - Aortic valve: Trileaflet; severely calcified leaflets.   Transvalvular velocity was increased. There was mild stenosis by   gradient and velocity measirement, mild to moderate by valve area   measurement Peak velocity (S): 249 cm/s. Mean gradient (S): 13 mm   Hg. Peak gradient (S): 25 mm Hg. Valve area (VTI): 1.41 cm^2. - Left atrium: The atrium was mildly dilated. - Pulmonary arteries: Systolic pressure was mildly elevated. PA   peak pressure: 45 mm Hg (S). Impressions: - Rhythm is atrial fibrillation.  Nuclear stress test 05/07/05: IMPRESSION:  1.     Normal LEFT ventricular function.  2.     Partial reversible lateral wall defect consistent with mild  ischemia. Suggest clinical correlation.    Past Medical History:  Diagnosis Date  . Atrial fibrillation ([place])    a. first diagnosed 06/2015; b. CHADS2VASc => 6 (CHF, HTN, age x 2, DM, vascualr disease)  . Basal cell carcinoma (BCC)    left leg and nose  . 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 Coronary artery disease involving native heart without angina pectoris 2002   s/p CABG x 5 (Dr. Roxy Manns  . Diabetes mellitus without complication ([place])   . Diastolic CHF ([place]) 3559  . HOH (hard of hearing)   . Hyperlipemia   . Hypertension   . Myocardial infarction [facility])    per daughter Donita Brooks  . S/P CABG x 5 2002   Dr. Roxy Manns  . Wears glasses     Past Surgical History:  Procedure Laterality Date  . CORONARY ARTERY BYPASS GRAFT    . ENDOSCOPIC RETROGRADE CHOLANGIOPANCREATOGRAPHY (ERCP) WITH PROPOFOL N/A 07/16/2015   Procedure: ENDOSCOPIC RETROGRADE CHOLANGIOPANCREATOGRAPHY (ERCP) WITH PROPOFOL;  Surgeon: Lucilla Lame, MD;  Location: [facility] ENDOSCOPY;  Service: Endoscopy;  Laterality: N/A;    MEDICATIONS: .  aspirin 325 MG tablet  . finasteride (PROSCAR) 5 MG tablet  . furosemide (LASIX) 20 MG tablet  . glimepiride (AMARYL) 2 MG tablet  . glimepiride (AMARYL) 4 MG tablet  . metFORMIN (GLUCOPHAGE) 500 MG tablet  . metoprolol succinate (TOPROL-XL) 50 MG 24 hr tablet  . ramipril (ALTACE) 10 MG capsule  . simvastatin (ZOCOR) 20 MG tablet   No current facility-administered medications for this encounter.     Myra Gianotti, PA-C Surgical Short Stay/Anesthesiology [facility] Phone [phone] [facility] Phone [phone] 07/09/2018 1:52 PM

## 2018-07-09 NOTE — Anesthesia Preprocedure Evaluation (Addendum)
Anesthesia Evaluation  Patient identified by MRN, date of birth, ID band Patient awake    Reviewed: Allergy & Precautions, H&P , NPO status , Patient's Chart, lab work & pertinent test results, reviewed documented beta blocker date and time   Airway Mallampati: II  TM Distance: >3 FB Neck ROM: Full    Dental no notable dental hx. (+) Teeth Intact, Dental Advisory Given   Pulmonary neg pulmonary ROS,    Pulmonary exam normal breath sounds clear to auscultation       Cardiovascular hypertension, Pt. on medications and Pt. on home beta blockers + CAD, + Past MI, + CABG, + Peripheral Vascular Disease and +CHF  + Valvular Problems/Murmurs AS  Rhythm:Regular Rate:Normal     Neuro/Psych negative neurological ROS  negative psych ROS   GI/Hepatic Neg liver ROS, PUD,   Endo/Other  diabetes, Type 2, Oral Hypoglycemic AgentsHypothyroidism   Renal/GU negative Renal ROS  negative genitourinary   Musculoskeletal   Abdominal   Peds  Hematology negative hematology ROS (+)   Anesthesia Other Findings   Reproductive/Obstetrics negative OB ROS                           Anesthesia Physical Anesthesia Plan  ASA: III  Anesthesia Plan: General   Post-op Pain Management:    Induction: Intravenous  PONV Risk Score and Plan: 3 and Ondansetron, Dexamethasone and Treatment may vary due to age or medical condition  Airway Management Planned: Oral ETT  Additional Equipment:   Intra-op Plan:   Post-operative Plan: Extubation in OR  Informed Consent: I have reviewed the patients History and Physical, chart, labs and discussed the procedure including the risks, benefits and alternatives for the proposed anesthesia with the patient or authorized representative who has indicated his/her understanding and acceptance.     Dental advisory given  Plan Discussed with: CRNA and Surgeon  Anesthesia Plan Comments:  (See PAT note written 07/09/2018 by Myra Gianotti, PA-C. History of CAD/CABG 2001, afib in 2017 (in acute illness). Last EKG in 2017. He is for EKG and labs on the day of surgery.  )       Anesthesia Quick Evaluation

## 2018-07-09 NOTE — Telephone Encounter (Signed)
Call from pts daughter re: whether pt needs to discontinue his ASA- she reports that he takes 81mg  ASA 2x/day She states he takes the ASA for preventative measures only - she denies he has any HX of stroke or any blood clot complication I consulted with Dr. Marla Roe & she requests that the pt hold the ASA -starting today until after procedure pts daughter understands the plan of care  [facility]

## 2018-07-09 NOTE — Telephone Encounter (Signed)
Received call from patient's daughter, Ardean Larsen. She would like to clarify whether or not the patient should discontinue Aspirin preoperatively. Call back [phone].

## 2018-07-12 ENCOUNTER — Ambulatory Visit (HOSPITAL_COMMUNITY): Payer: Medicare Other | Admitting: Vascular Surgery

## 2018-07-12 ENCOUNTER — Encounter (HOSPITAL_COMMUNITY)
Admission: RE | Disposition: A | Discharge status: Home / Self Care | Payer: Self-pay | Source: Home / Self Care | Attending: Plastic Surgery

## 2018-07-12 ENCOUNTER — Other Ambulatory Visit: Payer: Self-pay

## 2018-07-12 ENCOUNTER — Ambulatory Visit (HOSPITAL_COMMUNITY)
Admission: RE | Admit: 2018-07-12 | Discharge: 2018-07-12 | Disposition: A | Discharge status: Home / Self Care | Payer: Medicare Other | Attending: Plastic Surgery | Admitting: Plastic Surgery

## 2018-07-12 ENCOUNTER — Ambulatory Visit (HOSPITAL_COMMUNITY): Payer: Medicare Other | Admitting: Physician Assistant

## 2018-07-12 ENCOUNTER — Encounter (HOSPITAL_COMMUNITY): Payer: Self-pay

## 2018-07-12 DIAGNOSIS — I252 Old myocardial infarction: Secondary | ICD-10-CM | POA: Diagnosis not present

## 2018-07-12 DIAGNOSIS — C44311 Basal cell carcinoma of skin of nose: Secondary | ICD-10-CM | POA: Insufficient documentation

## 2018-07-12 DIAGNOSIS — E1151 Type 2 diabetes mellitus with diabetic peripheral angiopathy without gangrene: Secondary | ICD-10-CM | POA: Insufficient documentation

## 2018-07-12 DIAGNOSIS — Z7982 Long term (current) use of aspirin: Secondary | ICD-10-CM | POA: Diagnosis not present

## 2018-07-12 DIAGNOSIS — Z8572 Personal history of non-Hodgkin lymphomas: Secondary | ICD-10-CM | POA: Diagnosis not present

## 2018-07-12 DIAGNOSIS — I4891 Unspecified atrial fibrillation: Secondary | ICD-10-CM | POA: Insufficient documentation

## 2018-07-12 DIAGNOSIS — I5032 Chronic diastolic (congestive) heart failure: Secondary | ICD-10-CM | POA: Diagnosis not present

## 2018-07-12 DIAGNOSIS — C44719 Basal cell carcinoma of skin of left lower limb, including hip: Secondary | ICD-10-CM | POA: Insufficient documentation

## 2018-07-12 DIAGNOSIS — I110 Hypertensive heart disease with heart failure: Secondary | ICD-10-CM | POA: Diagnosis not present

## 2018-07-12 DIAGNOSIS — E785 Hyperlipidemia, unspecified: Secondary | ICD-10-CM | POA: Diagnosis not present

## 2018-07-12 DIAGNOSIS — Z951 Presence of aortocoronary bypass graft: Secondary | ICD-10-CM | POA: Diagnosis not present

## 2018-07-12 DIAGNOSIS — Z7984 Long term (current) use of oral hypoglycemic drugs: Secondary | ICD-10-CM | POA: Insufficient documentation

## 2018-07-12 DIAGNOSIS — Z79899 Other long term (current) drug therapy: Secondary | ICD-10-CM | POA: Insufficient documentation

## 2018-07-12 DIAGNOSIS — I2510 Atherosclerotic heart disease of native coronary artery without angina pectoris: Secondary | ICD-10-CM | POA: Insufficient documentation

## 2018-07-12 HISTORY — PX: EXCISION MASS LOWER EXTREMETIES: SHX6705

## 2018-07-12 HISTORY — PX: BASAL CELL CARCINOMA EXCISION: SHX1214

## 2018-07-12 LAB — BASIC METABOLIC PANEL
Anion gap: 11 (ref 5–15)
BUN: 13 mg/dL (ref 8–23)
CO2: 22 mmol/L (ref 22–32)
Calcium: 9.3 mg/dL (ref 8.9–10.3)
Chloride: 106 mmol/L (ref 98–111)
Creatinine, Ser: 0.99 mg/dL (ref 0.61–1.24)
GFR calc Af Amer: >60 mL/min (ref >60)
GFR calc non Af Amer: >60 mL/min (ref >60)
Glucose, Bld: 145 mg/dL — ABNORMAL HIGH (ref 70–99)
Potassium: 4.1 mmol/L (ref 3.5–5.1)
Sodium: 139 mmol/L (ref 135–145)

## 2018-07-12 LAB — CBC
HCT: 38.8 % — ABNORMAL LOW (ref 39.0–52.0)
Hemoglobin: 12.7 g/dL — ABNORMAL LOW (ref 13.0–17.0)
MCH: 29.3 pg (ref 26.0–34.0)
MCHC: 32.7 g/dL (ref 30.0–36.0)
MCV: 89.6 fL (ref 80.0–100.0)
Platelets: 239 10*3/uL (ref 150–400)
RBC: 4.33 MIL/uL (ref 4.22–5.81)
RDW: 13.1 % (ref 11.5–15.5)
WBC: 6.4 10*3/uL (ref 4.0–10.5)
nRBC: 0 % (ref 0.0–0.2)

## 2018-07-12 LAB — HEMOGLOBIN A1C
Hgb A1c MFr Bld: 6.9 % — ABNORMAL HIGH (ref 4.8–5.6)
Mean Plasma Glucose: 151.33 mg/dL

## 2018-07-12 LAB — GLUCOSE, CAPILLARY
Glucose-Capillary: 143 mg/dL — ABNORMAL HIGH (ref 70–99)
Glucose-Capillary: 135 mg/dL — ABNORMAL HIGH (ref 70–99)

## 2018-07-12 SURGERY — EXCISION, CARCINOMA, BASAL CELL, WITH FROZEN SECTION EXAMINATION
Anesthesia: General | Site: Nose

## 2018-07-12 MED ORDER — MIDAZOLAM HCL 2 MG/2ML IJ SOLN
INTRAMUSCULAR | Status: AC
  Filled 2018-07-12: qty 2

## 2018-07-12 MED ORDER — PROPOFOL 10 MG/ML IV BOLUS
INTRAVENOUS | Status: DC | PRN
  Administered 2018-07-12: 70 mg via INTRAVENOUS
  Administered 2018-07-12: 30 mg via INTRAVENOUS

## 2018-07-12 MED ORDER — PROPOFOL 10 MG/ML IV BOLUS
INTRAVENOUS | Status: AC
  Filled 2018-07-12: qty 20

## 2018-07-12 MED ORDER — LIDOCAINE 2% (20 MG/ML) 5 ML SYRINGE
INTRAMUSCULAR | Status: DC | PRN
  Administered 2018-07-12: 40 mg via INTRAVENOUS

## 2018-07-12 MED ORDER — ROCURONIUM BROMIDE 50 MG/5ML IV SOSY
PREFILLED_SYRINGE | INTRAVENOUS | Status: DC | PRN
  Administered 2018-07-12: 40 mg via INTRAVENOUS

## 2018-07-12 MED ORDER — ONDANSETRON HCL 4 MG/2ML IJ SOLN
INTRAMUSCULAR | Status: DC | PRN
  Administered 2018-07-12: 4 mg via INTRAVENOUS

## 2018-07-12 MED ORDER — SODIUM CHLORIDE 0.9% FLUSH: 3.0000 mL | INTRAVENOUS | Status: DC | PRN

## 2018-07-12 MED ORDER — PHENYLEPHRINE 40 MCG/ML (10ML) SYRINGE FOR IV PUSH (FOR BLOOD PRESSURE SUPPORT)
PREFILLED_SYRINGE | INTRAVENOUS | Status: AC
  Filled 2018-07-12: qty 10

## 2018-07-12 MED ORDER — BUPIVACAINE-EPINEPHRINE 0.25% -1:200000 IJ SOLN
INTRAMUSCULAR | Status: AC
  Filled 2018-07-12: qty 1

## 2018-07-12 MED ORDER — LACTATED RINGERS IV SOLN
INTRAVENOUS | Status: DC
  Administered 2018-07-12: 10:00:00 via INTRAVENOUS

## 2018-07-12 MED ORDER — HYDROCODONE-ACETAMINOPHEN 5-325 MG PO TABS: 1.0000 | ORAL_TABLET | Freq: Three times a day (TID) | ORAL | Status: DC | PRN

## 2018-07-12 MED ORDER — 0.9 % SODIUM CHLORIDE (POUR BTL) OPTIME
TOPICAL | Status: DC | PRN
  Administered 2018-07-12: 1000 mL

## 2018-07-12 MED ORDER — ACETAMINOPHEN 325 MG PO TABS: 650.0000 mg | ORAL_TABLET | ORAL | Status: DC | PRN

## 2018-07-12 MED ORDER — SODIUM CHLORIDE 0.9% FLUSH: 3.0000 mL | Freq: Two times a day (BID) | INTRAVENOUS | Status: DC

## 2018-07-12 MED ORDER — FENTANYL CITRATE (PF) 100 MCG/2ML IJ SOLN
INTRAMUSCULAR | Status: DC | PRN
  Administered 2018-07-12: 50 ug via INTRAVENOUS

## 2018-07-12 MED ORDER — LIDOCAINE-EPINEPHRINE 1 %-1:100000 IJ SOLN
INTRAMUSCULAR | Status: AC
  Filled 2018-07-12: qty 1

## 2018-07-12 MED ORDER — SODIUM CHLORIDE 0.9 % IV SOLN: 250.0000 mL | INTRAVENOUS | Status: DC | PRN

## 2018-07-12 MED ORDER — LIDOCAINE 2% (20 MG/ML) 5 ML SYRINGE
INTRAMUSCULAR | Status: AC
  Filled 2018-07-12: qty 5

## 2018-07-12 MED ORDER — FENTANYL CITRATE (PF) 250 MCG/5ML IJ SOLN
INTRAMUSCULAR | Status: AC
  Filled 2018-07-12: qty 5

## 2018-07-12 MED ORDER — ROCURONIUM BROMIDE 10 MG/ML (PF) SYRINGE
PREFILLED_SYRINGE | INTRAVENOUS | Status: AC
  Filled 2018-07-12: qty 10

## 2018-07-12 MED ORDER — ACETAMINOPHEN 500 MG PO TABS
1000.0000 mg | ORAL_TABLET | Freq: Once | ORAL | Status: AC
  Administered 2018-07-12: 10:00:00 1000 mg via ORAL

## 2018-07-12 MED ORDER — SUCCINYLCHOLINE CHLORIDE 200 MG/10ML IV SOSY
PREFILLED_SYRINGE | INTRAVENOUS | Status: AC
  Filled 2018-07-12: qty 10

## 2018-07-12 MED ORDER — PHENYLEPHRINE 40 MCG/ML (10ML) SYRINGE FOR IV PUSH (FOR BLOOD PRESSURE SUPPORT)
PREFILLED_SYRINGE | INTRAVENOUS | Status: DC | PRN
  Administered 2018-07-12: 40 ug via INTRAVENOUS

## 2018-07-12 MED ORDER — ACETAMINOPHEN 500 MG PO TABS
ORAL_TABLET | ORAL | Status: AC
  Filled 2018-07-12: qty 2

## 2018-07-12 MED ORDER — CHLORHEXIDINE GLUCONATE CLOTH 2 % EX PADS: 6.0000 | MEDICATED_PAD | Freq: Once | CUTANEOUS | Status: DC

## 2018-07-12 MED ORDER — CEFAZOLIN SODIUM-DEXTROSE 2-4 GM/100ML-% IV SOLN
2.0000 g | INTRAVENOUS | Status: AC
  Administered 2018-07-12: 2 g via INTRAVENOUS

## 2018-07-12 MED ORDER — OXYCODONE HCL 5 MG PO TABS
ORAL_TABLET | ORAL | Status: AC
  Filled 2018-07-12: qty 1

## 2018-07-12 MED ORDER — LIDOCAINE-EPINEPHRINE 1 %-1:100000 IJ SOLN
INTRAMUSCULAR | Status: DC | PRN
  Administered 2018-07-12: 11 mL

## 2018-07-12 MED ORDER — ONDANSETRON HCL 4 MG/2ML IJ SOLN
INTRAMUSCULAR | Status: AC
  Filled 2018-07-12: qty 2

## 2018-07-12 MED ORDER — SODIUM CHLORIDE 0.9 % IV SOLN
INTRAVENOUS | Status: DC | PRN
  Administered 2018-07-12: 25 ug/min via INTRAVENOUS

## 2018-07-12 MED ORDER — SUGAMMADEX SODIUM 200 MG/2ML IV SOLN
INTRAVENOUS | Status: DC | PRN
  Administered 2018-07-12: 170 mg via INTRAVENOUS

## 2018-07-12 MED ORDER — CEFAZOLIN SODIUM-DEXTROSE 2-4 GM/100ML-% IV SOLN
INTRAVENOUS | Status: AC
  Filled 2018-07-12: qty 100

## 2018-07-12 MED ORDER — ACETAMINOPHEN 650 MG RE SUPP: 650.0000 mg | RECTAL | Status: DC | PRN

## 2018-07-12 MED ORDER — OXYCODONE HCL 5 MG PO TABS
5.0000 mg | ORAL_TABLET | ORAL | Status: DC | PRN
  Administered 2018-07-12: 5 mg via ORAL

## 2018-07-12 MED ORDER — LIDOCAINE-EPINEPHRINE 0.5 %-1:200000 IJ SOLN
INTRAMUSCULAR | Status: AC
  Filled 2018-07-12: qty 1

## 2018-07-12 SURGICAL SUPPLY — 68 items
APPLICATOR COTTON TIP 6IN STRL (MISCELLANEOUS) ×8 IMPLANT
BALL CTTN LRG ABS STRL LF (GAUZE/BANDAGES/DRESSINGS) ×2
BANDAGE ELASTIC 4 VELCRO ST LF (GAUZE/BANDAGES/DRESSINGS) ×8 IMPLANT
BLADE 10 SAFETY STRL DISP (BLADE) ×4 IMPLANT
BLADE SURG 15 STRL LF DISP TIS (BLADE) ×2 IMPLANT
BLADE SURG 15 STRL SS (BLADE) ×4
BNDG GAUZE ELAST 4 BULKY (GAUZE/BANDAGES/DRESSINGS) ×4 IMPLANT
CANISTER SUCTION 1200CC (MISCELLANEOUS) IMPLANT
CANISTER WOUND CARE 500ML ATS (WOUND CARE) ×4 IMPLANT
COTTONBALL LRG STERILE PKG (GAUZE/BANDAGES/DRESSINGS) ×4 IMPLANT
COVER WAND RF STERILE (DRAPES) ×4 IMPLANT
DECANTER SPIKE VIAL GLASS SM (MISCELLANEOUS) ×4 IMPLANT
DRAPE INCISE IOBAN 66X45 STRL (DRAPES) IMPLANT
DRSG ADAPTIC 3X8 NADH LF (GAUZE/BANDAGES/DRESSINGS) IMPLANT
DRSG CUTIMED SORBACT 7X9 (GAUZE/BANDAGES/DRESSINGS) ×4 IMPLANT
DRSG VAC ATS LRG SENSATRAC (GAUZE/BANDAGES/DRESSINGS) IMPLANT
DRSG VAC ATS MED SENSATRAC (GAUZE/BANDAGES/DRESSINGS) IMPLANT
DRSG VAC ATS SM SENSATRAC (GAUZE/BANDAGES/DRESSINGS) IMPLANT
ELECT CAUTERY BLADE 6.4 (BLADE) ×4 IMPLANT
ELECT NDL TIP 2.8 STRL (NEEDLE) ×2 IMPLANT
ELECT NEEDLE TIP 2.8 STRL (NEEDLE) ×4 IMPLANT
ELECT REM PT RETURN 9FT ADLT (ELECTROSURGICAL) ×4
ELECTRODE REM PT RTRN 9FT ADLT (ELECTROSURGICAL) ×2 IMPLANT
GAUZE 4X4 16PLY RFD (DISPOSABLE) IMPLANT
GAUZE SPONGE 2X2 8PLY STRL LF (GAUZE/BANDAGES/DRESSINGS) IMPLANT
GAUZE SPONGE 4X4 12PLY STRL LF (GAUZE/BANDAGES/DRESSINGS) ×2 IMPLANT
GAUZE XEROFORM 5X9 LF (GAUZE/BANDAGES/DRESSINGS) ×4 IMPLANT
GEL ULTRASOUND 20GR AQUASONIC (MISCELLANEOUS) ×6 IMPLANT
GLOVE BIO SURGEON STRL SZ 6.5 (GLOVE) ×6 IMPLANT
GLOVE BIO SURGEONS STRL SZ 6.5 (GLOVE) ×2
GOWN STRL REUS W/ TWL LRG LVL3 (GOWN DISPOSABLE) ×6 IMPLANT
GOWN STRL REUS W/TWL LRG LVL3 (GOWN DISPOSABLE) ×12
KIT BASIN OR (CUSTOM PROCEDURE TRAY) ×4 IMPLANT
MATRIX WOUND 3-LAYER 7X10 (Tissue) ×1 IMPLANT
MICROMATRIX 1000MG (Tissue) ×4 IMPLANT
NDL HYPO 25GX1X1/2 BEV (NEEDLE) ×2 IMPLANT
NDL PRECISIONGLIDE 27X1.5 (NEEDLE) ×1 IMPLANT
NEEDLE HYPO 25GX1X1/2 BEV (NEEDLE) ×4 IMPLANT
NEEDLE PRECISIONGLIDE 27X1.5 (NEEDLE) ×4 IMPLANT
NS IRRIG 1000ML POUR BTL (IV SOLUTION) ×4 IMPLANT
PACK EENT II TURBAN DRAPE (CUSTOM PROCEDURE TRAY) ×4 IMPLANT
PACK ORTHO EXTREMITY (CUSTOM PROCEDURE TRAY) ×4 IMPLANT
PAD EYE OVAL STERILE LF (GAUZE/BANDAGES/DRESSINGS) IMPLANT
PENCIL BUTTON HOLSTER BLD 10FT (ELECTRODE) ×4 IMPLANT
SOLUTION PARTIC MCRMTRX 1000MG (Tissue) IMPLANT
SPONGE GAUZE 2X2 STER 10/PKG (GAUZE/BANDAGES/DRESSINGS) ×2
STAPLER VISISTAT 35W (STAPLE) IMPLANT
STOCKINETTE IMPERVIOUS 9X36 MD (GAUZE/BANDAGES/DRESSINGS) IMPLANT
STOCKINETTE IMPERVIOUS LG (DRAPES) IMPLANT
SUCTION FRAZIER HANDLE 10FR (MISCELLANEOUS)
SUCTION TUBE FRAZIER 10FR DISP (MISCELLANEOUS) IMPLANT
SUT ETHILON 4 0 PS 2 18 (SUTURE) IMPLANT
SUT ETHILON 5 0 P 3 18 (SUTURE)
SUT ETHILON 5 0 PS 2 18 (SUTURE) IMPLANT
SUT NYLON ETHILON 5-0 P-3 1X18 (SUTURE) IMPLANT
SUT SILK 4 0 P 3 (SUTURE) IMPLANT
SUT VIC AB 5-0 PS2 18 (SUTURE) IMPLANT
SYR BULB 3OZ (MISCELLANEOUS) ×4 IMPLANT
SYR CONTROL 10ML LL (SYRINGE) ×4 IMPLANT
TAPE CLOTH SURG 4X10 WHT LF (GAUZE/BANDAGES/DRESSINGS) ×2 IMPLANT
TOWEL OR 17X24 6PK STRL BLUE (TOWEL DISPOSABLE) ×8 IMPLANT
TOWEL OR 17X26 10 PK STRL BLUE (TOWEL DISPOSABLE) ×4 IMPLANT
TUBE CONNECTING 12'X1/4 (SUCTIONS) ×1
TUBE CONNECTING 12X1/4 (SUCTIONS) ×3 IMPLANT
TUBE CONNECTING 20'X1/4 (TUBING)
TUBE CONNECTING 20X1/4 (TUBING) IMPLANT
WOUND MATRIX 3-LAYER 7X10 (Tissue) ×1 IMPLANT
YANKAUER SUCT BULB TIP NO VENT (SUCTIONS) ×4 IMPLANT

## 2018-07-12 NOTE — Interval H&P Note (Signed)
History and Physical Interval Note:  07/12/2018 10:37 AM  Joseph Flowers  has presented today for surgery, with the diagnosis of Basal Cell Carcinoma of nose and left leg.  The various methods of treatment have been discussed with the patient and family. After consideration of risks, benefits and other options for treatment, the patient has consented to  Procedure(s): Excision of Basal Cell Carcinoma of nose (N/A) Excision of Basal Cell Carcinoma of left leg with Acell Placement (Left) as a surgical intervention.  The patient's history has been reviewed, patient examined, no change in status, stable for surgery.  I have reviewed the patient's chart and labs.  Questions were answered to the patient's satisfaction.     [name]

## 2018-07-12 NOTE — Transfer of Care (Signed)
Immediate Anesthesia Transfer of Care Note  Patient: Joseph Flowers  Procedure(s) Performed: Excision of Basal Cell Carcinoma of nose (N/A Nose) Excision of Basal Cell Carcinoma of left leg with Acell Placement (Left Leg Lower)  Patient Location: PACU  Anesthesia Type:General  Level of Consciousness: awake, alert , oriented and sedated  Airway & Oxygen Therapy: Patient Spontanous Breathing and Patient connected to nasal cannula oxygen  Post-op Assessment: Report given to RN, Post -op Vital signs reviewed and stable and Patient moving all extremities  Post vital signs: Reviewed and stable  Last Vitals:  Vitals Value Taken Time  BP 166/92 07/12/2018 12:26 PM  Temp    Pulse 82 07/12/2018 12:29 PM  Resp 24 07/12/2018 12:29 PM  SpO2 100 % 07/12/2018 12:29 PM  Vitals shown include unvalidated device data.  Last Pain:  Vitals:   07/12/18 0938  TempSrc:   PainSc: 0-No pain      Patients Stated Pain Goal: 3 (44/[id number])  Complications: No apparent anesthesia complications

## 2018-07-12 NOTE — Discharge Instructions (Signed)
INSTRUCTIONS FOR AFTER SURGERY ° ° °You are having surgery.  You will likely have some questions about what to expect following your operation.  The following information will help you and your family understand what to expect when you are discharged from the hospital.  Following these guidelines will help ensure a smooth recovery and reduce risks of complications.   °Postoperative instructions include information on: diet, wound care, medications and physical activity. ° °AFTER SURGERY °Expect to go home after the procedure.  In some cases, you may need to spend one night in the hospital for observation. ° °DIET °This surgery does not require a specific diet.  However, I have to mention that the healthier you eat the better your body can start healing. It is important to increasing your protein intake.  This means limiting the foods with sugar and carbohydrates.  Focus on vegetables and some meat.  If you have any liposuction during your procedure be sure to drink water.  If your urine is bright yellow, then it is concentrated, and you need to drink more water.  As a general rule after surgery, you should have 8 ounces of water every hour while awake.  If you find you are persistently nauseated or unable to take in liquids let us know.  NO TOBACCO USE or EXPOSURE.  This will slow your healing process and increase the risk of a wound. ° °WOUND CARE °You can shower the day after surgery if you don't have a drain.  Use fragrance free soap.  Dial, Dove and Ivory are usually mild on the skin. If you have a drain clean with baby wipes until the drain is removed.  If you have steri-strips / tape directly attached to your skin leave them in place. It is OK to get these wet.  No baths, pools or hot tubs for two weeks. °We close your incision to leave the smallest and best-looking scar. No ointment or creams on your incisions until given the go ahead.  Especially not Neosporin (Too many skin reactions with this one).  A few  weeks after surgery you can use Mederma and start massaging the scar. °We ask you to wear your binder or sports bra for the first 6 weeks around the clock, including while sleeping. This provides added comfort and helps reduce the fluid accumulation at the surgery site. ° °ACTIVITY °No heavy lifting until cleared by the doctor.  It is OK to walk and climb stairs. In fact, moving your legs is very important to decrease your risk of a blood clot.  It will also help keep you from getting deconditioned.  Every 1 to 2 hours get up and walk for 5 minutes. This will help with a quicker recovery back to normal.  Let pain be your guide so you don't do too much.  NO, you cannot do the spring cleaning and don't plan on taking care of anyone else.  This is your time for TLC.  °You will be more comfortable if you sleep and rest with your head elevated either with a few pillows under you or in a recliner.  No stomach sleeping for a few months. ° °WORK °Everyone returns to work at different times. As a rough guide, most people take at least 1 - 2 weeks off prior to returning to work. If you need documentation for your job, bring the forms to your postoperative follow up visit. ° °DRIVING °Arrange for someone to bring you home from the hospital.  You   may be able to drive a few days after surgery but not while taking any narcotics or valium.  BOWEL MOVEMENTS Constipation can occur after anesthesia and while taking pain medication.  It is important to stay ahead for your comfort.  We recommend taking Milk of Magnesia (2 tablespoons; twice a day) while taking the pain pills.  SEROMA This is fluid your body tried to put in the surgical site.  This is normal but if it creates tight skinny skin let us know.  It usually decreases in a few weeks.  WHEN TO CALL Call your surgeon's office if any of the following occur:  Fever 101 degrees F or greater  Excessive bleeding or fluid from the incision site.  Pain that increases  over time without aid from the medications  Redness, warmth, or pus draining from incision sites  Persistent nausea or inability to take in liquids  Severe misshapen area that underwent the operation.  Guide to Wound Care  Proper wound care may reduce the risk of infection, improve healing rates, and limit scarring.  This is a general guide to help care for and manage wounds treated with MicroMatrix or Cytal Wound Matrix.   Dressing Changes The frequency of dressing changes can vary based on which product was applied, the size of the wound, or the amount of wound drainage. Dressing inspections are recommended, at least weekly.   If you have a Wound VAC it will be changed in one week after the first time it is applied.  Then it will be changed once or twice a week.   If you don't have a Wound VAC, then place KY gel on the wound daily and cover with gauze.  Dressing Types Primary Dressing:  Non-adherent dressing goes directly over wounds being treated with the powder or sheet (MicroMatrix and/or Cytal).  Secondary Dressing:  Secures the primary dressing in place and provides extra protection, compression, and absorption.  1. Wash Hands - To help decrease the risk of infection, caregivers should wash their hands for a minimum of 20 seconds and may use medical gloves.   2. Remove the Dressings - Avoid removing product from the wound by carefully removing the applicable dressing(s) at the time points recommended above, or as recommended by the treating physician.  Expected Color and Odor:  It is entirely normal for the wound to have an unpleasant odor and to form a caramel-colored gel as the product absorbs into the wound. It is  important to leave this gel on the wound site.  3. Clean the Wound - Use clean water or saline to gently rinse the wound surface and remove any excess discharge that may be present on the wound. Do not wipe off any of the caramel-colored gel on the wound. Protocol for  cleaning the wound may vary by facility. If you are unsure what to do, ask the treating physician.  4. Inspect the Wound - Proper wound care requires accurate and clinically relevant wound assessment. Protocols for inspecting the wound may vary by facility, but it is important to  [id number] the length and width.  What to look out for:  Large or increased amount of drainage   Surrounding skin is red or hot to touch   Increased pain in or around the wound   Flu-like symptoms, fatigue, decreased appetite, fever   Hard, crusty wound surface with black or brown coloring  5. Apply New Dressings - Dressings should cover the entire wound and be suitable for maintaining  a moist wound environment. Refer to the back of this guide for more information.  Maintain a Hydrated Wound Area While hydration protocols may vary by facility, it is important to keep the wound area moist throughout the healing process. If the wound appears to be dry during dressing changes, select a dressing that will hydrate the wound and maintain that ideal moist environment. If you are unsure what to do, ask the treating physician.  Remodeling Process Every patient heals differently, and no two cases are the same. The size and location of the wound, product type and layering configurations, and general patient health all contribute to how quickly a wound will heal.  While many factors can influence the rate at which the product absorbs, the following can be used as a general guide.   THINGS TO DO: Refrain from smoking High protein diet and limit carbohydrates and suger Protect the wound from trauma Protect the dressing  Micromatrix powder       Cytal Sheet            Sorbact dressing

## 2018-07-12 NOTE — Anesthesia Postprocedure Evaluation (Signed)
Anesthesia Post Note  Patient: Joseph Flowers  Procedure(s) Performed: Excision of Basal Cell Carcinoma of nose (N/A Nose) Excision of Basal Cell Carcinoma of left leg with Acell Placement (Left Leg Lower)     Patient location during evaluation: PACU Anesthesia Type: General Level of consciousness: awake and alert Pain management: pain level controlled Vital Signs Assessment: post-procedure vital signs reviewed and stable Respiratory status: spontaneous breathing, nonlabored ventilation and respiratory function stable Cardiovascular status: blood pressure returned to baseline and stable Postop Assessment: no apparent nausea or vomiting Anesthetic complications: no    Last Vitals:  Vitals:   07/12/18 1311 07/12/18 1326  BP: (!) 158/84 (!) 156/80  Pulse: 84 80  Resp: 19 15  Temp: 36.6 C   SpO2: 97% 99%    Last Pain:  Vitals:   07/12/18 1311  TempSrc:   PainSc: 4                  Montavious Wierzba,W. EDMOND

## 2018-07-12 NOTE — Op Note (Signed)
DATE OF OPERATION: 07/12/2018  LOCATION: [facility]  PREOPERATIVE DIAGNOSIS: left leg and nose skin cancer  POSTOPERATIVE DIAGNOSIS: Same  PROCEDURE:  1. Excision of basal cell carcinoma of left leg 5 x 5 cm with placement of 6 x 6 cm Acell sheet and 500 mg powder 2. Excision of basal cell carcinoma of nose tip and left ala 2 x 2 cm with placement of   SURGEON: Nithila Sumners [place] Wayne Brunker, DO  ASSISTANT: Bonita Cox, RNFA  EBL: 5 cc  CONDITION: Stable  COMPLICATIONS: None  INDICATION: The patient, Joseph Flowers, is a 85 y.o. male born on 1927-06-04, is here for treatment of basal cell carcinoma of left leg and nose.   PROCEDURE DETAILS:  The patient was seen prior to surgery and marked.  The IV antibiotics were given. The patient was taken to the operating room and given a general anesthetic. A standard time out was performed and all information was confirmed by those in the room. SCD was placed on the right leg.   The left leg was prepped and draped.  Local was injected around the area for intraoperative hemostasis and postoperative pain control.  The left leg was marked with a 1 cm margin from the lesion.  The 3-0 Silk was placed at the 12 o'clock position short and 3 o'clock position long stitch.  The #10 blade was used to excise the lesion full thickness.  The Acell powder 500 mg and sheet 6 x 6 cm was applied and secured to the skin with the 5-0 Vicryl.  The sorbact was placed and secured in the same way.  The KY gel was applied with kerlex and ace wrap.  The nose was prepped and draped with betadine.  Local was injected for intraoperative hemostasis and postoperative pain control.  The #15 blade was used to excise the tip and left nasal ala. A short stitch was placed at 12 o'clock and a long stitch was placed at 3 o'clock. The edge of the left nasal ala tip was sent.  Left nasal ala tip and inner nose with short medial and long lateral.  The 2 x 2 cm Acell and  500 mg powder was placed and secured to the skin with 5-0 Vicryl.  The sorbact was applied and secured with the 5-0 Vicryl.  KY gel and gauze was secured to the dressing.  The patient was allowed to wake up and taken to recovery room in stable condition at the end of the case. The family was notified at the end of the case.   The RNFA assisted throughout the case.  The RNFA was essential in retraction and counter traction when needed to make the case progress smoothly.  This retraction and assistance made it possible to see the tissue plans for the procedure.  The assistance was needed for blood control, tissue re-approximation and assisted with closure of the incision site.

## 2018-07-12 NOTE — Anesthesia Procedure Notes (Signed)
Procedure Name: Intubation Date/Time: 07/12/2018 11:10 AM Performed by: Scheryl Darter, CRNA Pre-anesthesia Checklist: Patient identified and Emergency Drugs available Patient Re-evaluated:Patient Re-evaluated prior to induction Oxygen Delivery Method: Circle System Utilized Preoxygenation: Pre-oxygenation with 100% oxygen Induction Type: IV induction and Rapid sequence Ventilation: Mask ventilation without difficulty Laryngoscope Size: Miller and 2 Grade View: Grade I Tube type: Oral Tube size: 7.5 mm Number of attempts: 1 Placement Confirmation: positive ETCO2 Secured at: 23 cm Tube secured with: Tape Dental Injury: Teeth and Oropharynx as per pre-operative assessment

## 2018-07-13 ENCOUNTER — Encounter (HOSPITAL_COMMUNITY): Payer: Self-pay | Admitting: Plastic Surgery

## 2018-07-23 ENCOUNTER — Other Ambulatory Visit: Payer: Self-pay

## 2018-07-23 ENCOUNTER — Encounter: Payer: Self-pay | Admitting: Plastic Surgery

## 2018-07-23 ENCOUNTER — Ambulatory Visit (INDEPENDENT_AMBULATORY_CARE_PROVIDER_SITE_OTHER): Payer: Medicare Other | Admitting: Nurse Practitioner

## 2018-07-23 VITALS — BP 148/75 | HR 66 | Temp 97.6°F | Ht 70.0 in | Wt 188.0 lb

## 2018-07-23 DIAGNOSIS — C44719 Basal cell carcinoma of skin of left lower limb, including hip: Secondary | ICD-10-CM

## 2018-07-23 DIAGNOSIS — C44310 Basal cell carcinoma of skin of unspecified parts of face: Secondary | ICD-10-CM

## 2018-07-23 NOTE — Progress Notes (Addendum)
Patient ID: COREON SIMKINS, male    DOB: [birth year], 85 y.o.   MRN: [id number]   Skin problem Warnie Belair is an 85 yo male here for follow up after excision of two concerning areas.  One on his left leg and one on his nose.  No bleeding.  The sorbact is in place.  We will leave this for another week.  The nose is a little dry.  The leg is incorporating the acell nicely.  Past Medical History:  Diagnosis Date  . Atrial fibrillation ([place])    a. first diagnosed 06/2015; b. CHADS2VASc => 6 (CHF, HTN, age x 2, DM, vascualr disease)  . Basal cell carcinoma (BCC)    left leg and nose  . 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 Coronary artery disease involving native heart without angina pectoris 2002   s/p CABG x 5 (Dr. Roxy Manns  . Diabetes mellitus without complication ([place])   . Diastolic CHF ([place]) 4270  . HOH (hard of hearing)   . Hyperlipemia   . Hypertension   . Myocardial infarction [facility])    per daughter Donita Brooks  . S/P CABG x 5 2002   Dr. Roxy Manns  . Wears glasses     Past Surgical History:  Procedure Laterality Date  . BASAL CELL CARCINOMA EXCISION N/A 07/12/2018   Procedure: Excision of Basal Cell Carcinoma of nose;  Surgeon: Wallace Going, DO;  Location: [facility];  Service: Plastics;  Laterality: N/A;  . CORONARY ARTERY BYPASS GRAFT    . ENDOSCOPIC RETROGRADE CHOLANGIOPANCREATOGRAPHY (ERCP) WITH PROPOFOL N/A 07/16/2015   Procedure: ENDOSCOPIC RETROGRADE CHOLANGIOPANCREATOGRAPHY (ERCP) WITH PROPOFOL;  Surgeon: Lucilla Lame, MD;  Location: [facility];  Service: Endoscopy;  Laterality: N/A;  . EXCISION MASS LOWER EXTREMETIES Left 07/12/2018   Procedure: Excision of Basal Cell Carcinoma of left leg with Acell Placement;  Surgeon: Wallace Going, DO;  Location: [facility];  Service: Plastics;  Laterality: Left;      Current Outpatient Medications:  .  aspirin 325 MG tablet, Take 162.5 mg by mouth 2 (two) times daily. , Disp: , Rfl:  .  finasteride (PROSCAR) 5 MG  tablet, Take 5 mg by mouth daily. , Disp: , Rfl:  .  furosemide (LASIX) 20 MG tablet, Take 20 mg by mouth daily. , Disp: , Rfl:  .  glimepiride (AMARYL) 2 MG tablet, Take 2 mg by mouth daily with breakfast. , Disp: , Rfl:  .  glimepiride (AMARYL) 4 MG tablet, Take 4 mg by mouth daily with breakfast., Disp: , Rfl:  .  metFORMIN (GLUCOPHAGE) 500 MG tablet, Take 500 mg by mouth daily with breakfast. , Disp: , Rfl:  .  metoprolol succinate (TOPROL-XL) 50 MG 24 hr tablet, Take 1 tablet (50 mg total) by mouth 2 (two) times daily. Take with or immediately following a meal. (Patient taking differently: Take 50 mg by mouth daily. Take with or immediately following a meal.), Disp: 60 tablet, Rfl: 0 .  ramipril (ALTACE) 10 MG capsule, Take 10 mg by mouth daily. , Disp: , Rfl:  .  simvastatin (ZOCOR) 20 MG tablet, Take 20 mg by mouth at bedtime. , Disp: , Rfl:    Objective:   Vitals:   07/23/18 1343  BP: (!) 148/75  Pulse: 66  Temp: 97.6 F (36.4 C)  SpO2: 99%    Physical Exam  General: alert, calm, no acute distress HEENT: Neck:  Chest: symmetrical rise and fall Lungs: unlabored  breathing Breast:  Cardiac: +2 bilateral radial pulse, cap refill <3 sec Abdomen: GU:  Musculoskeletal: MAEx4 Neuro: A&O x3, calm, cooperative, steady gait Extremities:  Skin: as above Assessment & Plan:  Basal Cell Carcinoma of the face and left lower extremity.  Margins positive on the nose and will need re-excision. Continue KY to both areas daily as able.  May get it we.

## 2018-08-02 ENCOUNTER — Telehealth: Payer: Self-pay | Admitting: Plastic Surgery

## 2018-08-02 NOTE — Telephone Encounter (Signed)
Patient's daughter called to confirm appointment scheduled for tomorrow. She answered the following questions: 1. To the best of your knowledge, have you been in close contact with any one with a confirmed diagnosis of COVID-19? No 2. Have you had any one or more of the following; fever, chills, cough, shortness of breath, or any flu-like symptoms? No 3. Have you been diagnosed with or have a previous diagnosis of COVID 19? No 4. I am going to go over a few other symptoms with you. Please let me know if you are experiencing any of the following: None of the below a. Ear, nose, or throat discomfort b. A sore throat c. Headache d. Muscle pain e. Diarrhea f. Loss of taste or smell

## 2018-08-03 ENCOUNTER — Other Ambulatory Visit: Payer: Self-pay

## 2018-08-03 ENCOUNTER — Encounter: Payer: Self-pay | Admitting: Plastic Surgery

## 2018-08-03 ENCOUNTER — Ambulatory Visit: Payer: Medicare Other | Admitting: Nurse Practitioner

## 2018-08-03 VITALS — BP 148/75 | HR 88 | Temp 98.1°F | Ht 70.0 in | Wt 187.0 lb

## 2018-08-03 DIAGNOSIS — C44310 Basal cell carcinoma of skin of unspecified parts of face: Secondary | ICD-10-CM

## 2018-08-03 DIAGNOSIS — C44719 Basal cell carcinoma of skin of left lower limb, including hip: Secondary | ICD-10-CM

## 2018-08-03 NOTE — Patient Instructions (Signed)
Instructions for wound care:  1. Place KY gel and gauze over the leg wound once a day  2. Place vaseline and bandaids over the nose wound once a day  3. You can shower starting on 08/07/18  4. Follow up in the office in 2 weeks

## 2018-08-03 NOTE — Progress Notes (Signed)
Patient ID: Joseph Flowers, male    DOB: Dec 28, 1927, 85 y.o.   MRN: [id number]   Chief Complaint  Patient presents with  . Follow-up    2 weeks    Joseph Flowers is an 85 yo male POD #22 s/p excision of basal cell carcinoma of the nose and left lower leg. He presents with his daughter for follow up. He has been applying vaseline to the nose and KY gel to the left leg wound. He does the dressing changes himself. Denies any difficulty performing the dressing changes. Denies any pain or discomfort. He does have some bleeding from the leg wound during showers.    Review of Systems  Past Medical History:  Diagnosis Date  . Atrial fibrillation ([place])    a. first diagnosed 06/2015; b. CHADS2VASc => 6 (CHF, HTN, age x 2, DM, vascualr disease)  . Basal cell carcinoma (BCC)    left leg and nose  . 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 Coronary artery disease involving native heart without angina pectoris 2002   s/p CABG x 5 (Dr. Roxy Manns  . Diabetes mellitus without complication ([place])   . Diastolic CHF ([place]) 3491  . HOH (hard of hearing)   . Hyperlipemia   . Hypertension   . Myocardial infarction [facility])    per daughter Joseph Flowers  . S/P CABG x 5 2002   Dr. Roxy Manns  . Wears glasses     Past Surgical History:  Procedure Laterality Date  . BASAL CELL CARCINOMA EXCISION N/A 07/12/2018   Procedure: Excision of Basal Cell Carcinoma of nose;  Surgeon: Wallace Going, DO;  Location: [place];  Service: Plastics;  Laterality: N/A;  . CORONARY ARTERY BYPASS GRAFT    . ENDOSCOPIC RETROGRADE CHOLANGIOPANCREATOGRAPHY (ERCP) WITH PROPOFOL N/A 07/16/2015   Procedure: ENDOSCOPIC RETROGRADE CHOLANGIOPANCREATOGRAPHY (ERCP) WITH PROPOFOL;  Surgeon: Lucilla Lame, MD;  Location: [facility];  Service: Endoscopy;  Laterality: N/A;  . EXCISION MASS LOWER EXTREMETIES Left 07/12/2018   Procedure: Excision of Basal Cell Carcinoma of left leg with Acell Placement;  Surgeon: Wallace Going, DO;   Location: [place];  Service: Plastics;  Laterality: Left;      Current Outpatient Medications:  .  aspirin 325 MG tablet, Take 162.5 mg by mouth 2 (two) times daily. , Disp: , Rfl:  .  finasteride (PROSCAR) 5 MG tablet, Take 5 mg by mouth daily. , Disp: , Rfl:  .  furosemide (LASIX) 20 MG tablet, Take 20 mg by mouth daily. , Disp: , Rfl:  .  glimepiride (AMARYL) 2 MG tablet, Take 2 mg by mouth daily with breakfast. , Disp: , Rfl:  .  glimepiride (AMARYL) 4 MG tablet, Take 4 mg by mouth daily with breakfast., Disp: , Rfl:  .  metFORMIN (GLUCOPHAGE) 500 MG tablet, Take 500 mg by mouth daily with breakfast. , Disp: , Rfl:  .  metoprolol succinate (TOPROL-XL) 50 MG 24 hr tablet, Take 1 tablet (50 mg total) by mouth 2 (two) times daily. Take with or immediately following a meal. (Patient taking differently: Take 50 mg by mouth daily. Take with or immediately following a meal.), Disp: 60 tablet, Rfl: 0 .  ramipril (ALTACE) 10 MG capsule, Take 10 mg by mouth daily. , Disp: , Rfl:  .  simvastatin (ZOCOR) 20 MG tablet, Take 20 mg by mouth at bedtime. , Disp: , Rfl:    Objective:   Vitals:   08/03/18 1344  BP: Mar[place] Kitchen)  148/75  Pulse: 88  Temp: 98.1 F (36.7 C)  SpO2: 96%    Physical Exam  General: alert, calm, no acute distress HEENT: scabbed nose wound, no bleeding or surrounding edema Chest: symmetrical rise and fall Lungs: unlabored breathing Musculoskeletal: MAEx4 Neuro: A&O x3, calm, cooperative, steady gait Extremities: left lower leg wound with granulating tissue, no bleeding, no foul odor Skin: "see nose and extremities"   Assessment & Plan:  [name] is an 85 yo male POD #22 s/p excision of basal cell carcinoma of the nose and left lower leg. He is making good progress on wound healing. No signs of infection. He is doing well with his own care and dressing changes. The nose wound was covered with vaseline and bandaids. The left leg wound was covered with Adaptic, KY gel, and  gauze. Daily dressing changes with KY gel and gauze. He should wait to shower again until Saturday 6/13.    Alfredo Batty, NP

## 2018-08-16 ENCOUNTER — Telehealth: Payer: Self-pay | Admitting: Plastic Surgery

## 2018-08-16 NOTE — Telephone Encounter (Signed)
Called patient and spoke with patient's daughter to confirm appointment scheduled for tomorrow. Patient's daughter answered the following questions: 1. To the best of your knowledge, have you been in close contact with any one with a confirmed diagnosis of COVID-19? No 2. Have you had any one or more of the following; fever, chills, cough, shortness of breath, or any flu-like symptoms? No 3. Have you been diagnosed with or have a previous diagnosis of COVID 19? No 4. I am going to go over a few other symptoms with you. Please let me know if you are experiencing any of the following: None of the below a. Ear, nose, or throat discomfort b. A sore throat c. Headache d. Muscle pain e. Diarrhea f. Loss of taste or smell

## 2018-08-17 ENCOUNTER — Encounter: Payer: Self-pay | Admitting: Plastic Surgery

## 2018-08-17 ENCOUNTER — Other Ambulatory Visit: Payer: Self-pay

## 2018-08-17 ENCOUNTER — Ambulatory Visit: Payer: Medicare Other | Admitting: Plastic Surgery

## 2018-08-17 VITALS — BP 123/68 | HR 78 | Temp 97.8°F | Ht 70.0 in | Wt 185.0 lb

## 2018-08-17 DIAGNOSIS — C44719 Basal cell carcinoma of skin of left lower limb, including hip: Secondary | ICD-10-CM

## 2018-08-17 DIAGNOSIS — C44310 Basal cell carcinoma of skin of unspecified parts of face: Secondary | ICD-10-CM

## 2018-08-17 NOTE — Progress Notes (Signed)
Patient ID: Joseph Flowers, male    DOB: 10-25-1927, 85 y.o.   MRN: [id number]   Chief Complaint  Patient presents with  . Follow-up    2 weeks on (L) leg and nose    Joseph Flowers is an 85 yo male s/p excision of basal cell carcinoma of the nose and left lower leg on 07/12/18. He presents with his daughter for follow up. Pathology of left leg demonstrated free margins. Pathology of nose demonstrated atypical basaloid cells within margins. He has been applying vaseline to his nose and KY gel to the left leg wound. He feels an occasional stinging sensation in his left leg wound. He states it bled a little after his shower today. His daughter thinks the leg wound looks a little worse.   Review of Systems  Constitutional: Negative.   HENT:       Nose wound  Eyes: Negative.   Respiratory: Negative.   Cardiovascular: Negative.   Gastrointestinal: Negative.   Genitourinary: Negative.   Musculoskeletal: Negative.   Skin:       Left leg and nose wound  Neurological: Negative.     Past Medical History:  Diagnosis Date  . Atrial fibrillation ([place])    a. first diagnosed 06/2015; b. CHADS2VASc => 6 (CHF, HTN, age x 2, DM, vascualr disease)  . Basal cell carcinoma (BCC)    left leg and nose  . 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 Coronary artery disease involving native heart without angina pectoris 2002   s/p CABG x 5 (Dr. Roxy Manns  . Diabetes mellitus without complication ([place])   . Diastolic CHF ([place]) 0086  . HOH (hard of hearing)   . Hyperlipemia   . Hypertension   . Myocardial infarction [facility])    per daughter Joseph Flowers  . S/P CABG x 5 2002   Dr. Roxy Manns  . Wears glasses     Past Surgical History:  Procedure Laterality Date  . BASAL CELL CARCINOMA EXCISION N/A 07/12/2018   Procedure: Excision of Basal Cell Carcinoma of nose;  Surgeon: Wallace Going, DO;  Location: [facility];  Service: Plastics;  Laterality: N/A;  . CORONARY ARTERY BYPASS GRAFT    . ENDOSCOPIC  RETROGRADE CHOLANGIOPANCREATOGRAPHY (ERCP) WITH PROPOFOL N/A 07/16/2015   Procedure: ENDOSCOPIC RETROGRADE CHOLANGIOPANCREATOGRAPHY (ERCP) WITH PROPOFOL;  Surgeon: Lucilla Lame, MD;  Location: [facility];  Service: Endoscopy;  Laterality: N/A;  . EXCISION MASS LOWER EXTREMETIES Left 07/12/2018   Procedure: Excision of Basal Cell Carcinoma of left leg with Acell Placement;  Surgeon: Wallace Going, DO;  Location: [place];  Service: Plastics;  Laterality: Left;      Current Outpatient Medications:  .  aspirin 325 MG tablet, Take 162.5 mg by mouth 2 (two) times daily. , Disp: , Rfl:  .  finasteride (PROSCAR) 5 MG tablet, Take 5 mg by mouth daily. , Disp: , Rfl:  .  furosemide (LASIX) 20 MG tablet, Take 20 mg by mouth daily. , Disp: , Rfl:  .  glimepiride (AMARYL) 2 MG tablet, Take 2 mg by mouth daily with breakfast. , Disp: , Rfl:  .  glimepiride (AMARYL) 4 MG tablet, Take 4 mg by mouth daily with breakfast., Disp: , Rfl:  .  metFORMIN (GLUCOPHAGE) 500 MG tablet, Take 500 mg by mouth daily with breakfast. , Disp: , Rfl:  .  metoprolol succinate (TOPROL-XL) 50 MG 24 hr tablet, Take 1 tablet (50 mg total) by mouth 2 (two)  times daily. Take with or immediately following a meal. (Patient taking differently: Take 50 mg by mouth daily. Take with or immediately following a meal.), Disp: 60 tablet, Rfl: 0 .  ramipril (ALTACE) 10 MG capsule, Take 10 mg by mouth daily. , Disp: , Rfl:  .  simvastatin (ZOCOR) 20 MG tablet, Take 20 mg by mouth at bedtime. , Disp: , Rfl:    Objective:   Vitals:   08/17/18 1451  BP: 123/68  Pulse: 78  Temp: 97.8 F (36.6 C)  SpO2: 95%    Physical Exam  General: alert, calm, no acute distress HEENT: scabbed nose wound, no bleeding or surrounding edema Chest: symmetrical rise and fall Lungs: unlabored breathing Musculoskeletal: MAEx4 Neuro: A&O x3, calm, cooperative, steady gait Extremities: left lower leg wound with granulating tissue, no bleeding, no foul  odor, mild surrounding erythema with 3 small weeping vesicles of inferior aspect of leg wound Skin: "see nose and extremities"  Assessment & Plan:  Joseph Flowers is an 85 yo male s/p excision of basal cell carcinoma of the nose and left lower leg on 07/12/18. He is making good progress on wound healing. He will eventually need further excision on the nose due to involved margins. The left leg wound was covered with donated Acell powder, Acell matrix sheet, Adaptic, KY gel, gauze, and kerlex. Daily dressing changes with KY gel and gauze, while keeping Acell and Adaptic in place. No shower for one week. Return in 2 weeks.   Picture placed in chart with patient's consent.  Alfredo Batty, NP

## 2018-08-30 ENCOUNTER — Telehealth: Payer: Self-pay | Admitting: Plastic Surgery

## 2018-08-30 NOTE — Telephone Encounter (Signed)
Called patient's daughter to confirm appointment scheduled for tomorrow. Patient's daughter answered the following questions: 1. To the best of your knowledge, have you been in close contact with any one with a confirmed diagnosis of COVID-19? No 2. Have you had any one or more of the following; fever, chills, cough, shortness of breath, or any flu-like symptoms? No 3. Have you been diagnosed with or have a previous diagnosis of COVID 19? No 4. I am going to go over a few other symptoms with you. Please let me know if you are experiencing any of the following: None of the below a. Ear, nose, or throat discomfort b. A sore throat c. Headache d. Muscle pain e. Diarrhea f. Loss of taste or smell

## 2018-08-30 NOTE — Progress Notes (Signed)
Patient ID: ARIE POWELL, male    DOB: 03-03-1927, 85 y.o.   MRN: [id number]   No chief complaint on file.  Arland Usery is an 85 yo male s/p excision of basal cell carcinoma of the nose and left lower leg on 07/12/18. He presents with his daughter for wound care follow up. Pathology of left leg demonstrated free margins. Pathology of nose demonstrated atypical basaloid cells within margins. He has been applying KY gel and covering the left leg wound with gauze. His daughter thinks the left leg looks better. Patient fell in a gas station parking lot on the way to this appointment. His glasses fogged up and he missed a step. His right hand immediately began to swell and bruise. He also sustained a small skin tear on the right elbow. He did not lose consciousness. Patient's daughter plans to take him to the ED after this visit to have the right hand evaluated.    Review of Systems  Constitutional:       Fall  HENT: Negative.   Respiratory: Negative.   Cardiovascular: Negative.   Gastrointestinal: Negative.   Genitourinary: Negative.   Musculoskeletal:       Swelling, pain, and bruising on right hand  Skin:       Wound left leg and right elbow  Neurological: Negative.     Past Medical History:  Diagnosis Date  . Atrial fibrillation ([place])    a. first diagnosed 06/2015; b. CHADS2VASc => 6 (CHF, HTN, age x 2, DM, vascualr disease)  . Basal cell carcinoma (BCC)    left leg and nose  . 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 Coronary artery disease involving native heart without angina pectoris 2002   s/p CABG x 5 (Dr. Roxy Manns  . Diabetes mellitus without complication ([place])   . Diastolic CHF ([place]) 6384  . HOH (hard of hearing)   . Hyperlipemia   . Hypertension   . Myocardial infarction [facility])    per daughter Donita Brooks  . S/P CABG x 5 2002   Dr. Roxy Manns  . Wears glasses     Past Surgical History:  Procedure Laterality Date  . BASAL CELL CARCINOMA EXCISION N/A 07/12/2018   Procedure: Excision of Basal Cell Carcinoma of nose;  Surgeon: Wallace Going, DO;  Location: [place];  Service: Plastics;  Laterality: N/A;  . CORONARY ARTERY BYPASS GRAFT    . ENDOSCOPIC RETROGRADE CHOLANGIOPANCREATOGRAPHY (ERCP) WITH PROPOFOL N/A 07/16/2015   Procedure: ENDOSCOPIC RETROGRADE CHOLANGIOPANCREATOGRAPHY (ERCP) WITH PROPOFOL;  Surgeon: Lucilla Lame, MD;  Location: [facility];  Service: Endoscopy;  Laterality: N/A;  . EXCISION MASS LOWER EXTREMETIES Left 07/12/2018   Procedure: Excision of Basal Cell Carcinoma of left leg with Acell Placement;  Surgeon: Wallace Going, DO;  Location: [place];  Service: Plastics;  Laterality: Left;      Current Outpatient Medications:  .  aspirin 325 MG tablet, Take 162.5 mg by mouth 2 (two) times daily. , Disp: , Rfl:  .  finasteride (PROSCAR) 5 MG tablet, Take 5 mg by mouth daily. , Disp: , Rfl:  .  furosemide (LASIX) 20 MG tablet, Take 20 mg by mouth daily. , Disp: , Rfl:  .  glimepiride (AMARYL) 2 MG tablet, Take 2 mg by mouth daily with breakfast. , Disp: , Rfl:  .  glimepiride (AMARYL) 4 MG tablet, Take 4 mg by mouth daily with breakfast., Disp: , Rfl:  .  metFORMIN (GLUCOPHAGE) 500 MG  tablet, Take 500 mg by mouth daily with breakfast. , Disp: , Rfl:  .  metoprolol succinate (TOPROL-XL) 50 MG 24 hr tablet, Take 1 tablet (50 mg total) by mouth 2 (two) times daily. Take with or immediately following a meal. (Patient taking differently: Take 50 mg by mouth daily. Take with or immediately following a meal.), Disp: 60 tablet, Rfl: 0 .  ramipril (ALTACE) 10 MG capsule, Take 10 mg by mouth daily. , Disp: , Rfl:  .  simvastatin (ZOCOR) 20 MG tablet, Take 20 mg by mouth at bedtime. , Disp: , Rfl:    Objective:   There were no vitals filed for this visit.  Physical Exam  General: alert, calm, no acute distress HEENT: scabbed nose wound, no bleeding or surrounding edema Chest: symmetrical rise and fall Lungs: unlabored breathing  Musculoskeletal: MAEx4 Neuro: A&O x3, calm, cooperative, steady gait Extremities: right hand edema, ecchymosis, and tenderness Skin: 1.5 x 1 cm skin tear to right elbow   Assessment & Plan:  Basal cell carcinoma of face  Basal cell carcinoma (BCC) of left lower extremity  Moustafa Mossa is an 85 yo male s/p excision of basal cell carcinoma of the nose and left lower leg on 07/12/18. His nose and left leg are healing well. He will eventually need further excision on the nose due to involved margins. The left leg wound was dressed with xeroform and gauze. Patient fell immediately prior to arrival and sustained a right elbow skin tear and right hand injury. The elbow was dressed with xeroform and dry gauze. An ice pack was applied to the hand.   May apply KY gel or vaseline and gauze to the left leg wound. May shower. Return in 3 weeks.      Alfredo Batty, NP

## 2018-08-31 ENCOUNTER — Other Ambulatory Visit: Payer: Self-pay

## 2018-08-31 ENCOUNTER — Ambulatory Visit: Payer: Medicare Other | Admitting: Nurse Practitioner

## 2018-08-31 ENCOUNTER — Encounter: Payer: Self-pay | Admitting: Nurse Practitioner

## 2018-08-31 VITALS — BP 115/67 | HR 71 | Temp 98.6°F

## 2018-08-31 DIAGNOSIS — C44310 Basal cell carcinoma of skin of unspecified parts of face: Secondary | ICD-10-CM

## 2018-08-31 DIAGNOSIS — C44719 Basal cell carcinoma of skin of left lower limb, including hip: Secondary | ICD-10-CM

## 2018-09-27 ENCOUNTER — Telehealth: Payer: Self-pay

## 2018-09-27 NOTE — Telephone Encounter (Signed)
Called patient to confirm appointment scheduled for tomorrow. Patient's daughter answered the following questions: 1. To the best of your knowledge, have you been in close contact with any one with a confirmed diagnosis of COVID-19? No 2. Have you had any one or more of the following; fever, chills, cough, shortness of breath, or any flu-like symptoms? No 3. Have you been diagnosed with or have a previous diagnosis of COVID 19? No 4. I am going to go over a few other symptoms with you. Please let me know if you are experiencing any of the following: None of the below a. Ear, nose, or throat discomfort b. A sore throat c. Headache d. Muscle pain e. Diarrhea f. Loss of taste or smell   

## 2018-09-28 ENCOUNTER — Ambulatory Visit: Payer: Medicare Other | Admitting: Plastic Surgery

## 2018-09-28 ENCOUNTER — Encounter: Payer: Self-pay | Admitting: Plastic Surgery

## 2018-09-28 ENCOUNTER — Other Ambulatory Visit: Payer: Self-pay

## 2018-09-28 VITALS — BP 135/75 | HR 85 | Temp 98.4°F | Ht 70.0 in | Wt 185.0 lb

## 2018-09-28 DIAGNOSIS — C44310 Basal cell carcinoma of skin of unspecified parts of face: Secondary | ICD-10-CM | POA: Diagnosis not present

## 2018-09-28 DIAGNOSIS — C44719 Basal cell carcinoma of skin of left lower limb, including hip: Secondary | ICD-10-CM | POA: Diagnosis not present

## 2018-09-28 NOTE — Progress Notes (Signed)
   Subjective:    Patient ID: Joseph Flowers, male    DOB: 1927-11-28, 85 y.o.   MRN: [id number]  The patient is an 85 year old male here for follow-up on his left leg wound he also had excision of basal cell carcinoma of his nose.  He does not want to do any further surgery on his nose at this time he may be ever he understands that there are still some positive margins.  We may be able to get him to do 5-FU but he wants to get the leg cleared first.  This is reasonable.  The wound on the left leg is filling in nicely.  It is a little more swollen today than his last visit.  The ACell is still incorporating nicely.   Review of Systems  Constitutional: Negative for activity change and appetite change.  Respiratory: Negative for shortness of breath.   Cardiovascular: Positive for leg swelling.  Gastrointestinal: Negative for abdominal pain.  Genitourinary: Negative.   Musculoskeletal: Positive for gait problem.  Skin: Positive for wound.  Psychiatric/Behavioral: Negative.        Objective:   Physical Exam Vitals signs and nursing note reviewed.  Constitutional:      Appearance: Normal appearance.  HENT:     Head: Normocephalic.  Cardiovascular:     Rate and Rhythm: Normal rate.  Pulmonary:     Effort: Pulmonary effort is normal.  Neurological:     General: No focal deficit present.     Mental Status: He is alert.  Psychiatric:        Mood and Affect: Mood normal.        Behavior: Behavior normal.       Assessment & Plan:     ICD-10-CM   1. Basal cell carcinoma of face  C44.310   2. Basal cell carcinoma (BCC) of left lower extremity  C44.719     Continue with daily dressing changes can now do Vaseline.  Needs to keep leg elevated and wrapped due to the chronic swelling.  This is probably related to his vein graft surgery. Pictures were obtained of the patient and placed in the chart with the patient's or guardian's permission.

## 2018-09-29 ENCOUNTER — Telehealth: Payer: Self-pay | Admitting: *Deleted

## 2018-09-29 NOTE — Telephone Encounter (Signed)
Per Dr. Marla Roe see if we can get wound supplies (Collagen,Gauze pad,Tape,and Kerlix Bandage Roll) sent to the patient's home.  Faxed orders to [facility] along with Demographics,copy of Insurance card, and last office visit notes.  Faxed confirmation received.//AB/CMA

## 2018-10-21 ENCOUNTER — Telehealth: Payer: Self-pay

## 2018-10-21 NOTE — Telephone Encounter (Signed)

## 2018-10-22 ENCOUNTER — Encounter: Payer: Self-pay | Admitting: Nurse Practitioner

## 2018-10-22 ENCOUNTER — Other Ambulatory Visit: Payer: Self-pay

## 2018-10-22 ENCOUNTER — Ambulatory Visit: Payer: Medicare Other | Admitting: Nurse Practitioner

## 2018-10-22 VITALS — BP 134/73 | HR 91 | Temp 97.8°F | Ht 70.0 in | Wt 188.2 lb

## 2018-10-22 DIAGNOSIS — C44310 Basal cell carcinoma of skin of unspecified parts of face: Secondary | ICD-10-CM | POA: Diagnosis not present

## 2018-10-22 DIAGNOSIS — C44719 Basal cell carcinoma of skin of left lower limb, including hip: Secondary | ICD-10-CM

## 2018-10-22 NOTE — Progress Notes (Signed)
Patient ID: Joseph Flowers, male    DOB: Dec 22, 1927, 85 y.o.   MRN: [id number]  C.C.: wound care follow up  Joseph Flowers is an 85 yo male s/p excision of basal cell carcinoma of the nose and left lower legon 07/12/18. He presents with his daughter for wound care follow up. Pathology of left leg demonstrated free margins. Pathology of nose demonstrated atypical basaloidcells within margins. Patient states he does not want any further surgery on his nose, "unless absolutely necessary." He would like to get the leg wound healed first. He has been applying vaseline to the leg wound. Denies any pain. He states the left leg swelling has gotten better. He wears compression socks.   Review of Systems  Constitutional: Negative.   HENT: Negative.   Respiratory: Negative.   Cardiovascular: Negative.   Gastrointestinal: Negative.   Genitourinary: Negative.   Musculoskeletal: Negative.   Skin: Positive for wound.  Neurological: Negative.     Past Medical History:  Diagnosis Date  . Atrial fibrillation ([place])    a. first diagnosed 06/2015; b. CHADS2VASc => 6 (CHF, HTN, age x 2, DM, vascualr disease)  . Basal cell carcinoma (BCC)    left leg and nose  . 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 Coronary artery disease involving native heart without angina pectoris 2002   s/p CABG x 5 (Dr. Roxy Manns  . Diabetes mellitus without complication ([place])   . Diastolic CHF ([place]) 99991111  . HOH (hard of hearing)   . Hyperlipemia   . Hypertension   . Myocardial infarction [facility])    per daughter Donita Brooks  . S/P CABG x 5 2002   Dr. Roxy Manns  . Wears glasses     Past Surgical History:  Procedure Laterality Date  . BASAL CELL CARCINOMA EXCISION N/A 07/12/2018   Procedure: Excision of Basal Cell Carcinoma of nose;  Surgeon: Wallace Going, DO;  Location: [place];  Service: Plastics;  Laterality: N/A;  . CORONARY ARTERY BYPASS GRAFT    . ENDOSCOPIC RETROGRADE CHOLANGIOPANCREATOGRAPHY (ERCP) WITH PROPOFOL  N/A 07/16/2015   Procedure: ENDOSCOPIC RETROGRADE CHOLANGIOPANCREATOGRAPHY (ERCP) WITH PROPOFOL;  Surgeon: Lucilla Lame, MD;  Location: [facility] ENDOSCOPY;  Service: Endoscopy;  Laterality: N/A;  . EXCISION MASS LOWER EXTREMETIES Left 07/12/2018   Procedure: Excision of Basal Cell Carcinoma of left leg with Acell Placement;  Surgeon: Wallace Going, DO;  Location: [place];  Service: Plastics;  Laterality: Left;      Current Outpatient Medications:  .  aspirin 325 MG tablet, Take 162.5 mg by mouth 2 (two) times daily. , Disp: , Rfl:  .  finasteride (PROSCAR) 5 MG tablet, Take 5 mg by mouth daily. , Disp: , Rfl:  .  furosemide (LASIX) 20 MG tablet, Take 20 mg by mouth daily. , Disp: , Rfl:  .  glimepiride (AMARYL) 2 MG tablet, Take 2 mg by mouth daily with breakfast. , Disp: , Rfl:  .  glimepiride (AMARYL) 4 MG tablet, Take 4 mg by mouth daily with breakfast., Disp: , Rfl:  .  metFORMIN (GLUCOPHAGE) 500 MG tablet, Take 500 mg by mouth daily with breakfast. , Disp: , Rfl:  .  metoprolol succinate (TOPROL-XL) 50 MG 24 hr tablet, Take 1 tablet (50 mg total) by mouth 2 (two) times daily. Take with or immediately following a meal. (Patient taking differently: Take 50 mg by mouth daily. Take with or immediately following a meal.), Disp: 60 tablet, Rfl: 0 .  ramipril (ALTACE) 10 MG capsule, Take 10 mg by mouth daily. , Disp: , Rfl:  .  simvastatin (ZOCOR) 20 MG tablet, Take 20 mg by mouth at bedtime. , Disp: , Rfl:    Objective:   Vitals:   10/22/18 1315  BP: 134/73  Pulse: 91  Temp: 97.8 F (36.6 C)  SpO2: 93%    Physical Exam  General: alert, calm, no acute distress HEENT:slight asymmetry of nares; clean, dry, intact, and approximated nasal incision Neck: supple, full ROM Chest: symmetrical rise and fall Lungs: unlabored breathing Cardiac: +2 bilateral radial pulse, cap refill <3 sec Musculoskeletal: MAEx4 Neuro: A&O x3, calm, cooperative, steady gait Extremities: +1 non-pitting edema in  left lower extremity Skin: 3 x 3.5 cm circular open wound with fibrous tissue on medial left lower leg       Assessment & Plan:  Basal cell carcinoma of face  Basal cell carcinoma (BCC) of left lower extremity  Joseph Flowers is an 85 yo male s/p excision of basal cell carcinoma of the nose and left lower legon 07/12/18. The nasal wound has healed very nicely. He understands the margins are positive and will consider further surgery after his leg heals. The leg wound healing has stalled a bit. There is some fibrous tissue within the wound. Will first attempt Santyl cream. Unsure if this will be covered by insurance. Will consider debridement in the OR if no improvement with Santyl. Return in 1 month. Patient's daughter will call if unable to obtain Santyl or no improvement within next 2 weeks.   Picture placed in chart with patient's consent.  Alfredo Batty, NP

## 2018-11-25 ENCOUNTER — Telehealth: Payer: Self-pay

## 2018-11-25 NOTE — Telephone Encounter (Signed)
Called patient's daughter to confirm appointment scheduled for tomorrow. Patient's daughter answered the following questions: °1. To the best of your knowledge, have you been in close contact with any one with a confirmed diagnosis of COVID-19? No °2. Have you had any one or more of the following; fever, chills, cough, shortness of breath, or any flu-like symptoms? No °3. Have you been diagnosed with or have a previous diagnosis of COVID 19? No °4. I am going to go over a few other symptoms with you. Please let me know if you are experiencing any of the following: None of the below °a. Ear, nose, or throat discomfort °b. A sore throat °c. Headache °d. Muscle pain °e. Diarrhea °f. Loss of taste or smell  ° °

## 2018-11-25 NOTE — Progress Notes (Signed)
Subjective:     Patient ID: Joseph Flowers, male    DOB: 07/18/1927, 85 y.o.   MRN: [id number]  Chief Complaint  Patient presents with  . Follow-up    1 mos on nose and (L) lower leg    HPI: The patient is a 85 y.o. male here for follow-up after excision of basal cell carcinoma of the nose and left lower leg on 07/12/2018.  Pathology of nose demonstrated atypical basaloid cells within the margins.  At previous visit he mentioned that he did not want to undergo any further surgery on his nose unless it is absolutely necessary.  He was last seen approximately 1 month ago on 10/22/2018.  Today he reports that he has been doing well.  He has been applying Santyl cream every 48 hours.  He reports the last time after he left he had his lower lateral shin on his daughter's car and had a bump.  It has improved for the past month but there is still a noticeable soft tissue injury and bump on his lower left extremity.  No sign of hematoma or seroma, feels like to soft tissue swelling.  He has not had any fever, chills, nausea, vomiting.  Reports that the wound has been doing well over the past week.  It is 1.5 x 2 cm in size, hyper granulated.  There is new epithelial tissue surrounding the wound.  He has a small new lesion on the anterior shin approximately 20 cm above his ankle that looks suspicious, recommended follow-up with dermatology.   Review of Systems  Constitutional: Negative for chills, diaphoresis, fever, malaise/fatigue and weight loss.  Respiratory: Negative for cough, shortness of breath and wheezing.   Cardiovascular: Negative.   Genitourinary: Negative.   Musculoskeletal: Positive for myalgias.  Skin: Positive for rash. Negative for itching.  Neurological: Negative for dizziness and headaches.     Objective:   Vital Signs BP (!) 152/70 (BP Location: Left Arm, Patient Position: Sitting, Cuff Size: Normal)   Pulse 83   Temp 97.7 F (36.5 C) (Temporal)   Ht 5\' 10"   (1.778 m)   Wt 189 lb 3.2 oz (85.8 kg)   SpO2 96%   BMI 27.15 kg/m  Vital Signs and Nursing Note Reviewed  Physical Exam  Constitutional: He is oriented to person, place, and time and well-developed, well-nourished, and in no distress.  HENT:  Head: Normocephalic.  Cardiovascular: Normal rate.  Pulmonary/Chest: Effort normal.  Musculoskeletal: Normal range of motion.        General: Tenderness, deformity and edema present.       Legs:       Feet:  Neurological: He is alert and oriented to person, place, and time. Gait normal.  Skin: Skin is warm and dry. No rash noted. He is not diaphoretic. No erythema. No pallor.  Dry  Psychiatric: Mood and affect normal.    Assessment/Plan:     ICD-10-CM   1. Basal cell carcinoma of face  C44.310   2. Basal cell carcinoma (BCC) of left lower extremity  C44.719     Continue with Santyl cream as previously.  Consider wrapping lower extremity with Ace wrap for swelling.  Keep leg elevated.  Daughter reports that he lives alone and has been doing wound care on his own.  Overall there is some improvement and he can continue Santyl.  May need debridement in the operating room.  He also should go see his dermatologist for evaluation of his left lower extremity  new lesion on his shin.  We spoke more about the basal cell carcinoma of his nose including the margins and our recommendation to receive further surgery for excision of margins.  daughter and the patient understood and said they will think about it.  They would like to continue with wound care on the ankle and think about surgery on the nose after that.  No fever, chills, nausea, vomiting.  Follow-up in 1 month.  Carola Rhine Bubber Rothert, PA-C 11/26/2018, 3:14 PM

## 2018-11-26 ENCOUNTER — Ambulatory Visit: Payer: Medicare Other | Admitting: Surgical

## 2018-11-26 ENCOUNTER — Other Ambulatory Visit: Payer: Self-pay

## 2018-11-26 ENCOUNTER — Encounter: Payer: Self-pay | Admitting: Surgical

## 2018-11-26 VITALS — BP 152/70 | HR 83 | Temp 97.7°F | Ht 70.0 in | Wt 189.2 lb

## 2018-11-26 DIAGNOSIS — C44310 Basal cell carcinoma of skin of unspecified parts of face: Secondary | ICD-10-CM | POA: Diagnosis not present

## 2018-11-26 DIAGNOSIS — C44719 Basal cell carcinoma of skin of left lower limb, including hip: Secondary | ICD-10-CM | POA: Diagnosis not present

## 2018-12-27 ENCOUNTER — Telehealth: Payer: Self-pay

## 2018-12-27 NOTE — Telephone Encounter (Signed)
Called patient to confirm appointment scheduled for tomorrow. Patient's daughter answered the following questions: 1. To the best of your knowledge, have you been in close contact with any one with a confirmed diagnosis of COVID-19? No 2. Have you had any one or more of the following; fever, chills, cough, shortness of breath, or any flu-like symptoms? No 3. Have you been diagnosed with or have a previous diagnosis of COVID 19? No 4. I am going to go over a few other symptoms with you. Please let me know if you are experiencing any of the following: None of the below a. Ear, nose, or throat discomfort b. A sore throat c. Headache d. Muscle pain e. Diarrhea f. Loss of taste or smell   

## 2018-12-28 ENCOUNTER — Ambulatory Visit: Payer: Medicare Other | Admitting: Plastic Surgery

## 2019-01-11 ENCOUNTER — Other Ambulatory Visit: Payer: Self-pay

## 2019-01-11 ENCOUNTER — Encounter: Payer: Self-pay | Admitting: Surgical

## 2019-01-11 ENCOUNTER — Ambulatory Visit (INDEPENDENT_AMBULATORY_CARE_PROVIDER_SITE_OTHER): Payer: Medicare Other | Admitting: Surgical

## 2019-01-11 VITALS — BP 136/82 | HR 75 | Temp 97.7°F | Ht 70.0 in | Wt 187.0 lb

## 2019-01-11 DIAGNOSIS — C44719 Basal cell carcinoma of skin of left lower limb, including hip: Secondary | ICD-10-CM

## 2019-01-11 DIAGNOSIS — C44310 Basal cell carcinoma of skin of unspecified parts of face: Secondary | ICD-10-CM | POA: Diagnosis not present

## 2019-01-11 NOTE — Progress Notes (Signed)
Subjective:     Patient ID: Joseph Flowers, male    DOB: 1927-10-23, 85 y.o.   MRN: [id number]  Chief Complaint  Patient presents with  . Follow-up    1 month    HPI: The patient is a 85 y.o. male here for follow-up after excision of basal cell carcinoma of his nose and his left lower leg on 07/12/2018.  He is aware that the pathology of his nose demonstrated basal cell carcinoma with that margins being involved, would like to wait for leg to heal prior to additional nose surgery.   He reports he is doing well. His nose is well healed, with small scab at the left apex.   LLE wound is doing well, he has been applying santyl to the area every other day.  He has not been covering it with a bandage.  He is very happy with the progress.  He reports no pain and no difficulty or interference with walking.  He reports that he has still been active, has been raking leaves at his house without any issues.  He would like to know more information about the procedure in regards to the reexcision of his nasal basal cell carcinoma.  He has chronic left-sided leg swelling, per daughter this started after removal of left leg veins for heart surgery.  Review of Systems  Constitutional: Positive for activity change. Negative for appetite change, chills, diaphoresis, fatigue and fever.  Respiratory: Negative.   Cardiovascular: Positive for leg swelling (Left side).  Gastrointestinal: Negative.   Genitourinary: Negative.   Musculoskeletal: Negative.   Neurological: Negative.      Objective:   Vital Signs BP 136/82 (BP Location: Left Arm, Patient Position: Sitting, Cuff Size: Large)   Pulse 75   Temp 97.7 F (36.5 C) (Temporal)   Ht 5\' 10"  (1.778 m)   Wt 187 lb (84.8 kg)   SpO2 91%   BMI 26.83 kg/m  Vital Signs and Nursing Note Reviewed  Physical Exam  Constitutional: He is oriented to person, place, and time and well-developed, well-nourished, and in no distress.  HENT:  Head:  Normocephalic and atraumatic.  Cardiovascular: Normal rate.  Pulmonary/Chest: Effort normal.  Musculoskeletal: Normal range of motion.        General: Tenderness and edema (Left leg) present.       Feet:     Comments: Left leg hyper granulated wound that is 2.5 x 3 cm x + 0.2.  There is new epithelium surrounding the wound.  No surrounding erythema, there is some edema of his left lower extremity.  The left lower extremity is tender to palpation.    Neurological: He is alert and oriented to person, place, and time. Gait normal.  Skin: Skin is warm and dry. No rash noted. He is not diaphoretic. No erythema. No pallor.  Psychiatric: Mood and affect normal.          Assessment/Plan:     ICD-10-CM   1. Basal cell carcinoma (BCC) of left lower extremity  C44.719   2. Basal cell carcinoma of face  C44.310      Mr. Amadon is doing well.  He is very pleased with his progress.  Left lower extremity wound with hyper granulated tissue. There is fresh epithelium surrounding the wound.  Continue with Santyl every other day and Vaseline on off days.  He reports that he has been displacing the bandage on it, recommend wrapping with Kerlix for compression, this will assist with preventing additional hyper  granulation.  Follow-up in 3 weeks for reevaluation of left lower extremity wound and to talk more about additional procedures for reexcision of basal cell carcinoma of his nose.  Pictures were obtained of the patient and placed in the chart with the patient's or guardian's permission.   Carola Rhine Lasheena Frieze, PA-C 01/11/2019, 2:39 PM

## 2019-02-01 ENCOUNTER — Other Ambulatory Visit: Payer: Self-pay

## 2019-02-01 ENCOUNTER — Ambulatory Visit: Payer: Medicare Other | Admitting: Plastic Surgery

## 2019-02-01 ENCOUNTER — Encounter: Payer: Self-pay | Admitting: Plastic Surgery

## 2019-02-01 VITALS — BP 156/92 | HR 83 | Temp 97.5°F | Ht 70.0 in | Wt 190.2 lb

## 2019-02-01 DIAGNOSIS — C44310 Basal cell carcinoma of skin of unspecified parts of face: Secondary | ICD-10-CM | POA: Diagnosis not present

## 2019-02-01 DIAGNOSIS — C44719 Basal cell carcinoma of skin of left lower limb, including hip: Secondary | ICD-10-CM

## 2019-02-01 NOTE — Progress Notes (Signed)
   Subjective:    Patient ID: Joseph Flowers, male    DOB: 1927/10/14, 85 y.o.   MRN: [id number]  The patient is an 85 year old male here with his daughter for follow-up on his multiple skin cancers.  Overall he is doing extremely well.  The wound on his left leg is 1 x 1 cm.  He has been using Santyl on this.  The nose has completely healed.  He does not want to do anything about that at this time.  None of the areas appear to be infected and overall he is doing extremely well.   Review of Systems  Constitutional: Negative.   Eyes: Negative.   Respiratory: Negative.   Cardiovascular: Negative.   Gastrointestinal: Negative.   Genitourinary: Negative.   Skin: Positive for wound.  Psychiatric/Behavioral: Negative.        Objective:   Physical Exam Vitals signs and nursing note reviewed.  Constitutional:      Appearance: Normal appearance.  Cardiovascular:     Rate and Rhythm: Normal rate.  Pulmonary:     Effort: Pulmonary effort is normal.  Musculoskeletal:       Legs:  Skin:    General: Skin is warm.  Neurological:     Mental Status: He is alert. Mental status is at baseline.  Psychiatric:        Mood and Affect: Mood normal.        Behavior: Behavior normal.        Thought Content: Thought content normal.         Assessment & Plan:     ICD-10-CM   1. Basal cell carcinoma (BCC) of left lower extremity  C44.719   2. Basal cell carcinoma of face  C44.310     I recommend doing Vaseline on his leg.  With a small Band-Aid.  Let us know if there is any change. I would like to see him back in several months and we can decide on any more excision of his nose.  In the meantime his daughters can keep an eye on it and let us know if there is any breakdown in the skin. Pictures were obtained of the patient and placed in the chart with the patient's or guardian's permission.

## 2019-03-08 ENCOUNTER — Other Ambulatory Visit: Payer: Self-pay

## 2019-03-08 ENCOUNTER — Ambulatory Visit: Payer: Medicare Other | Admitting: Plastic Surgery

## 2019-03-08 ENCOUNTER — Encounter: Payer: Self-pay | Admitting: Plastic Surgery

## 2019-03-08 VITALS — BP 144/81 | HR 104 | Temp 97.5°F | Ht 70.0 in | Wt 189.6 lb

## 2019-03-08 DIAGNOSIS — C44310 Basal cell carcinoma of skin of unspecified parts of face: Secondary | ICD-10-CM

## 2019-03-08 DIAGNOSIS — C44719 Basal cell carcinoma of skin of left lower limb, including hip: Secondary | ICD-10-CM

## 2019-03-08 NOTE — Progress Notes (Signed)
Subjective:     Patient ID: Joseph Flowers, male    DOB: Sep 25, 1927, 86 y.o.   MRN: [id number]  Chief Complaint  Patient presents with  . Follow-up    1 mos on Bascal cell carcinoma of (L) lower extremity and face    HPI: The patient is a 86 y.o. male here with his son for follow-up after excision of basal cell carcinoma on his nose and his lower left leg on 07/12/2018.    He reports that he is doing very well and feels that both his nose and his leg are doing good. Incision on his nose has healed well. He is aware that the pathology of his nose demonstrated basal cell carcinoma with margins being involved.  His leg wound has healed well (photo in chart). He has been keeping it covered with a Band-Aid. There is dry skin around the wound and some chronic leg swelling.  Review of Systems  Constitutional: Negative for chills and fever.  HENT: Negative for congestion and sore throat.   Respiratory: Negative for cough.   Skin: Negative for pallor and rash.       Dry skin on left leg and some chronic swelling     Objective:   Vital Signs BP (!) 144/81 (BP Location: Left Arm, Patient Position: Sitting, Cuff Size: Normal)   Pulse (!) 104   Temp (!) 97.5 F (36.4 C) (Temporal)   Ht 5\' 10"  (1.778 m)   Wt 189 lb 9.6 oz (86 kg)   SpO2 97%   BMI 27.20 kg/m  Vital Signs and Nursing Note Reviewed  Physical Exam  Constitutional: He is oriented to person, place, and time and well-developed, well-nourished, and in no distress.  HENT:  Head: Normocephalic and atraumatic.  Eyes: EOM are normal.  Pulmonary/Chest: Effort normal.  Musculoskeletal:        General: Normal range of motion.     Cervical back: Normal range of motion.       Feet:  Neurological: He is alert and oriented to person, place, and time. Gait normal.  Skin: Skin is warm and dry. No rash noted. No erythema. No pallor.  Psychiatric: Mood, memory, affect and judgment normal.      Assessment/Plan:     ICD-10-CM     1. Basal cell carcinoma of face  C44.310   2. Basal cell carcinoma (BCC) of left lower extremity  C44.719    Joseph Flowers is doing very well today. The incision on his nose has healed well.  He is aware that the pathology indicates basal cell carcinoma with the margins involved. Re-excision should be considered to obtain clean margins. He and his son verbalized understanding that they should watch this place carefully for any skin canges.  The incision on his leg is healing very well.  Recommend moisturizing the skin around the incision and the incision itself with Vaseline or unscented moisturizer.   Recommend using sunscreen and/or large brimmed hat any time he's in the sun. He would like to call if he feels he needs additional follow up and/or if he sees and skin changes to his nose incision area.   Pictures were obtained of the patient and placed in the chart with the patient's or guardian's permission.  The [place] was signed into law in 2016 which includes the topic of electronic health records.  This provides immediate access to information in MyChart.  This includes consultation notes, operative notes, office notes, lab results and pathology  reports.  If you have any questions about what you read please let us know at your next visit or call us at the office.  We are right here with you.    Threasa Heads, PA-C 03/08/2019, 3:30 PM

## 2019-06-02 ENCOUNTER — Emergency Department: Payer: Medicare Other

## 2019-06-02 ENCOUNTER — Other Ambulatory Visit: Payer: Self-pay

## 2019-06-02 ENCOUNTER — Inpatient Hospital Stay
Admission: EM | Admit: 2019-06-02 | Discharge: 2019-06-09 | DRG: 291 | Disposition: A | Discharge status: Hospice | Payer: Medicare Other | Attending: Student | Admitting: Student

## 2019-06-02 DIAGNOSIS — Z79899 Other long term (current) drug therapy: Secondary | ICD-10-CM

## 2019-06-02 DIAGNOSIS — Z7901 Long term (current) use of anticoagulants: Secondary | ICD-10-CM

## 2019-06-02 DIAGNOSIS — I509 Heart failure, unspecified: Secondary | ICD-10-CM

## 2019-06-02 DIAGNOSIS — I5031 Acute diastolic (congestive) heart failure: Secondary | ICD-10-CM

## 2019-06-02 DIAGNOSIS — Z20822 Contact with and (suspected) exposure to covid-19: Secondary | ICD-10-CM | POA: Diagnosis present

## 2019-06-02 DIAGNOSIS — Z7982 Long term (current) use of aspirin: Secondary | ICD-10-CM

## 2019-06-02 DIAGNOSIS — I080 Rheumatic disorders of both mitral and aortic valves: Secondary | ICD-10-CM | POA: Diagnosis present

## 2019-06-02 DIAGNOSIS — E119 Type 2 diabetes mellitus without complications: Secondary | ICD-10-CM | POA: Diagnosis present

## 2019-06-02 DIAGNOSIS — I4510 Unspecified right bundle-branch block: Secondary | ICD-10-CM | POA: Diagnosis present

## 2019-06-02 DIAGNOSIS — E876 Hypokalemia: Secondary | ICD-10-CM | POA: Diagnosis present

## 2019-06-02 DIAGNOSIS — I252 Old myocardial infarction: Secondary | ICD-10-CM

## 2019-06-02 DIAGNOSIS — R05 Cough: Secondary | ICD-10-CM | POA: Diagnosis present

## 2019-06-02 DIAGNOSIS — Z8572 Personal history of non-Hodgkin lymphomas: Secondary | ICD-10-CM

## 2019-06-02 DIAGNOSIS — T17908A Unspecified foreign body in respiratory tract, part unspecified causing other injury, initial encounter: Secondary | ICD-10-CM

## 2019-06-02 DIAGNOSIS — Z66 Do not resuscitate: Secondary | ICD-10-CM | POA: Diagnosis present

## 2019-06-02 DIAGNOSIS — E875 Hyperkalemia: Secondary | ICD-10-CM

## 2019-06-02 DIAGNOSIS — Z7189 Other specified counseling: Secondary | ICD-10-CM

## 2019-06-02 DIAGNOSIS — H9190 Unspecified hearing loss, unspecified ear: Secondary | ICD-10-CM | POA: Diagnosis present

## 2019-06-02 DIAGNOSIS — R54 Age-related physical debility: Secondary | ICD-10-CM | POA: Diagnosis present

## 2019-06-02 DIAGNOSIS — I110 Hypertensive heart disease with heart failure: Secondary | ICD-10-CM | POA: Diagnosis not present

## 2019-06-02 DIAGNOSIS — I2510 Atherosclerotic heart disease of native coronary artery without angina pectoris: Secondary | ICD-10-CM | POA: Diagnosis present

## 2019-06-02 DIAGNOSIS — Z809 Family history of malignant neoplasm, unspecified: Secondary | ICD-10-CM

## 2019-06-02 DIAGNOSIS — I4892 Unspecified atrial flutter: Secondary | ICD-10-CM | POA: Diagnosis present

## 2019-06-02 DIAGNOSIS — Z7984 Long term (current) use of oral hypoglycemic drugs: Secondary | ICD-10-CM

## 2019-06-02 DIAGNOSIS — I5043 Acute on chronic combined systolic (congestive) and diastolic (congestive) heart failure: Secondary | ICD-10-CM | POA: Diagnosis present

## 2019-06-02 DIAGNOSIS — I2489 Other forms of acute ischemic heart disease: Secondary | ICD-10-CM

## 2019-06-02 DIAGNOSIS — J9601 Acute respiratory failure with hypoxia: Secondary | ICD-10-CM

## 2019-06-02 DIAGNOSIS — N400 Enlarged prostate without lower urinary tract symptoms: Secondary | ICD-10-CM | POA: Diagnosis present

## 2019-06-02 DIAGNOSIS — I248 Other forms of acute ischemic heart disease: Secondary | ICD-10-CM | POA: Diagnosis present

## 2019-06-02 DIAGNOSIS — Z85828 Personal history of other malignant neoplasm of skin: Secondary | ICD-10-CM

## 2019-06-02 DIAGNOSIS — I872 Venous insufficiency (chronic) (peripheral): Secondary | ICD-10-CM | POA: Diagnosis present

## 2019-06-02 DIAGNOSIS — Z515 Encounter for palliative care: Secondary | ICD-10-CM | POA: Diagnosis present

## 2019-06-02 DIAGNOSIS — E785 Hyperlipidemia, unspecified: Secondary | ICD-10-CM | POA: Diagnosis present

## 2019-06-02 DIAGNOSIS — Z951 Presence of aortocoronary bypass graft: Secondary | ICD-10-CM

## 2019-06-02 DIAGNOSIS — I4821 Permanent atrial fibrillation: Secondary | ICD-10-CM | POA: Diagnosis present

## 2019-06-02 DIAGNOSIS — N179 Acute kidney failure, unspecified: Secondary | ICD-10-CM

## 2019-06-02 DIAGNOSIS — M7989 Other specified soft tissue disorders: Secondary | ICD-10-CM | POA: Diagnosis present

## 2019-06-02 LAB — CBC
HCT: 45.1 % (ref 39.0–52.0)
Hemoglobin: 14.7 g/dL (ref 13.0–17.0)
MCH: 29.2 pg (ref 26.0–34.0)
MCHC: 32.6 g/dL (ref 30.0–36.0)
MCV: 89.7 fL (ref 80.0–100.0)
Platelets: 224 10*3/uL (ref 150–400)
RBC: 5.03 MIL/uL (ref 4.22–5.81)
RDW: 14.8 % (ref 11.5–15.5)
WBC: 8 10*3/uL (ref 4.0–10.5)
nRBC: 0 % (ref 0.0–0.2)

## 2019-06-02 LAB — COMPREHENSIVE METABOLIC PANEL
ALT: 36 U/L (ref 0–44)
AST: 37 U/L (ref 15–41)
Albumin: 3.1 g/dL — ABNORMAL LOW (ref 3.5–5.0)
Alkaline Phosphatase: 153 U/L — ABNORMAL HIGH (ref 38–126)
Anion gap: 10 (ref 5–15)
BUN: 38 mg/dL — ABNORMAL HIGH (ref 8–23)
CO2: 24 mmol/L (ref 22–32)
Calcium: 9.5 mg/dL (ref 8.9–10.3)
Chloride: 104 mmol/L (ref 98–111)
Creatinine, Ser: 1.21 mg/dL (ref 0.61–1.24)
GFR calc Af Amer: >60 mL/min (ref >60)
GFR calc non Af Amer: 53 mL/min — ABNORMAL LOW (ref >60)
Glucose, Bld: 179 mg/dL — ABNORMAL HIGH (ref 70–99)
Potassium: 5.6 mmol/L — ABNORMAL HIGH (ref 3.5–5.1)
Sodium: 138 mmol/L (ref 135–145)
Total Bilirubin: 1.2 mg/dL (ref 0.3–1.2)
Total Protein: 6.5 g/dL (ref 6.5–8.1)

## 2019-06-02 LAB — BRAIN NATRIURETIC PEPTIDE: B Natriuretic Peptide: 3392 pg/mL — ABNORMAL HIGH (ref 0.0–100.0)

## 2019-06-02 LAB — LACTIC ACID, PLASMA: Lactic Acid, Venous: 2.9 mmol/L (ref 0.5–1.9)

## 2019-06-02 LAB — TROPONIN I (HIGH SENSITIVITY): Troponin I (High Sensitivity): 51 ng/L — ABNORMAL HIGH (ref <18)

## 2019-06-02 MED ORDER — FUROSEMIDE 10 MG/ML IJ SOLN
40.0000 mg | Freq: Once | INTRAMUSCULAR | Status: AC
  Administered 2019-06-02: 40 mg via INTRAVENOUS
  Filled 2019-06-02: qty 4

## 2019-06-02 NOTE — ED Triage Notes (Signed)
Pt in with co bilat leg swelling and redness from knees down. Saw pmd for the same a week ago and was not prescribed any new meds. States has had swelling for 3 weeks but redness has worsened and now has weeping noted. Pt states does have some shob, for 2-3 days. No distress noted at this time.

## 2019-06-02 NOTE — ED Provider Notes (Signed)
[facility] Emergency Department Provider Note  ____________________________________________  Time seen: Approximately 11:43 PM  I have reviewed the triage vital signs and the nursing notes.   HISTORY  Chief Complaint Leg Swelling   HPI Joseph Flowers is a 86 y.o. male with a history of atrial fibrillation, CAD status post CABG, diastolic CHF, hypertension, hyperlipidemia who presents for evaluation of bilateral leg swelling.  Patient complaining of progressively worsening bilateral leg swelling for the last 3 weeks.  Over the last 2 to 3 days has had redness and weeping as well.  Is complaining of mild shortness of breath with ambulation.  No orthopnea.  Patient lives alone and does not have a cardiologist.  Endorses compliance with his 20 mg daily dose of furosemide.  Reports noticing decrease amount of urine output over the last several weeks.  Denies any chest pain, fever, chills, history of PE or DVT, leg pain.   Past Medical History:  Diagnosis Date  . Atrial fibrillation ([place])    a. first diagnosed 06/2015; b. CHADS2VASc => 6 (CHF, HTN, age x 2, DM, vascualr disease)  . Basal cell carcinoma (BCC)    left leg and nose  . 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 Coronary artery disease involving native heart without angina pectoris 2002   s/p CABG x 5 (Dr. Roxy Manns  . Diabetes mellitus without complication ([place])   . Diastolic CHF ([place]) 99991111  . HOH (hard of hearing)   . Hyperlipemia   . Hypertension   . Myocardial infarction [facility])    per daughter Donita Brooks  . S/P CABG x 5 2002   Dr. Roxy Manns  . Wears glasses     Patient Active Problem List   Diagnosis Date Noted  . Basal cell carcinoma of face 06/15/2018  . Basal cell carcinoma of leg 06/15/2018  . Lymphedema 11/02/2016  . Chronic venous insufficiency 11/02/2016  . PAD (peripheral artery disease) ([place]) 09/25/2016  . Leg pain 09/25/2016  . 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 Benign fibroma of prostate 08/02/2015  . Controlled type 2 diabetes mellitus without complication ([place]) 123456  . H/O renal calculi 08/02/2015  . HLD (hyperlipidemia) 08/02/2015  . BP (high blood pressure) 08/02/2015  . Adult hypothyroidism 08/02/2015  . Myocardial infarction ([place]) 08/02/2015  . SOB (shortness of breath)   . Atrial fibrillation ([place])   . Coronary artery disease involving native heart without angina pectoris   . S/P CABG x 5   . Diastolic CHF ([place])   . Postpyloric ulcer   . Esophagitis, unspecified   . Acquired hypertrophic pyloric stenosis   . Cholangitis 07/14/2015  . Calculus of gallbladder with biliary obstruction but without cholecystitis   . H/O neoplasm 06/25/2015  . Bowel obstruction ([place])   . SBO (small bowel obstruction) ([place])   . Intestinal obstruction due to adhesions ([place]) 08/18/2014  . CAD in native artery 03/22/2014  . Difficult or painful urination 02/02/2012  . Incomplete bladder emptying 02/02/2012  . Flu vaccine need 02/02/2012  . Excessive urination at night 02/02/2012  . Benign prostatic hyperplasia with urinary obstruction 02/02/2012  . Symptoms involving urinary system 02/02/2012    Past Surgical History:  Procedure Laterality Date  . BASAL CELL CARCINOMA EXCISION N/A 07/12/2018   Procedure: Excision of Basal Cell Carcinoma of nose;  Surgeon: Wallace Going, DO;  Location: [place];  Service: Plastics;  Laterality: N/A;  .  CORONARY ARTERY BYPASS GRAFT    . ENDOSCOPIC RETROGRADE CHOLANGIOPANCREATOGRAPHY (ERCP) WITH PROPOFOL N/A 07/16/2015   Procedure: ENDOSCOPIC RETROGRADE CHOLANGIOPANCREATOGRAPHY (ERCP) WITH PROPOFOL;  Surgeon: Lucilla Lame, MD;  Location: [facility] ENDOSCOPY;  Service: Endoscopy;  Laterality: N/A;  . EXCISION MASS LOWER EXTREMETIES Left 07/12/2018   Procedure: Excision of Basal Cell Carcinoma of left leg with Acell Placement;  Surgeon: Wallace Going, DO;  Location: [place];  Service:  Plastics;  Laterality: Left;    Prior to Admission medications   Medication Sig Start Date End Date Taking? Authorizing Provider  aspirin EC 81 MG tablet Take 81 mg by mouth 2 (two) times daily.   Yes [provider]  finasteride (PROSCAR) 5 MG tablet Take 5 mg by mouth daily.  06/12/14  Yes [provider]  furosemide (LASIX) 20 MG tablet Take 20 mg by mouth daily.  08/11/14  Yes [provider]  glimepiride (AMARYL) 2 MG tablet Take 2 mg by mouth daily with breakfast.  07/19/16  Yes [provider]  glimepiride (AMARYL) 4 MG tablet Take 4 mg by mouth daily with breakfast. 06/04/15  Yes [provider]  metFORMIN (GLUCOPHAGE) 500 MG tablet Take 500 mg by mouth daily with breakfast.  08/11/14  Yes [provider]  metoprolol succinate (TOPROL-XL) 50 MG 24 hr tablet Take 1 tablet (50 mg total) by mouth 2 (two) times daily. Take with or immediately following a meal. Patient taking differently: Take 50 mg by mouth daily. Take with or immediately following a meal. 07/20/15  Yes [name], MD  ramipril (ALTACE) 10 MG capsule Take 10 mg by mouth daily.  08/11/14  Yes [provider]  SANTYL ointment APPLY TO LEFT LEG EVERY OTHER DAY 10/23/18  Yes [provider]  simvastatin (ZOCOR) 20 MG tablet Take 20 mg by mouth at bedtime.  08/11/14  Yes [provider]    Allergies Patient has no known allergies.  Family History  Problem Relation Age of Onset  . Cancer Mother   . Cancer Father     Social History Social History   Tobacco Use  . Smoking status: Never Smoker  . Smokeless tobacco: Never Used  Substance Use Topics  . Alcohol use: No    Alcohol/week: 0.0 standard drinks  . Drug use: No    Review of Systems  Constitutional: Negative for fever. Eyes: Negative for visual changes. ENT: Negative for sore throat. Neck: No neck pain  Cardiovascular: Negative for chest pain. Respiratory: + shortness of  breath. Gastrointestinal: Negative for abdominal pain, vomiting or diarrhea. Genitourinary: Negative for dysuria. Musculoskeletal: Negative for back pain. + b/l leg swelling Skin: Negative for rash. Neurological: Negative for headaches, weakness or numbness. Psych: No SI or HI  ____________________________________________   PHYSICAL EXAM:  VITAL SIGNS: ED Triage Vitals  Enc Vitals Group     BP 06/02/19 2038 110/66     Pulse Rate 06/02/19 2038 87     Resp 06/02/19 2038 20     Temp 06/02/19 2038 97.7 F (36.5 C)     Temp Source 06/02/19 2038 Oral     SpO2 06/02/19 2038 93 %     Weight 06/02/19 2039 184 lb (83.5 kg)     Height --      Head Circumference --      Peak Flow --      Pain Score 06/02/19 2039 0     Pain Loc --      Pain Edu? --  Excl. in [place]? --     Constitutional: Alert and oriented. Well appearing and in no apparent distress. HEENT:      Head: Normocephalic and atraumatic.         Eyes: Conjunctivae are normal. Sclera is non-icteric.       Mouth/Throat: Mucous membranes are moist.       Neck: Supple with no signs of meningismus. Cardiovascular: Regular rate and rhythm. No murmurs, gallops, or rubs. 2+ symmetrical distal pulses are present in all extremities.  Respiratory: Normal respiratory effort. Lungs are clear to auscultation bilaterally. No wheezes, crackles, or rhonchi.  Gastrointestinal: Soft, non tender. Musculoskeletal: 3+ pitting edema on b/l LE with overlying erythema, cool to the touch, no warmth, mild clear weeping, normal distal pulses.  Neurologic: Normal speech and language. Face is symmetric. Moving all extremities. No gross focal neurologic deficits are appreciated. Skin: Skin is warm, dry and intact. No rash noted. Psychiatric: Mood and affect are normal. Speech and behavior are normal.  ____________________________________________   LABS (all labs ordered are listed, but only abnormal results are displayed)  Labs Reviewed    COMPREHENSIVE METABOLIC PANEL - Abnormal; Notable for the following components:      Result Value   Potassium 5.6 (*)    Glucose, Bld 179 (*)    BUN 38 (*)    Albumin 3.1 (*)    Alkaline Phosphatase 153 (*)    GFR calc non Af Amer 53 (*)    All other components within normal limits  BRAIN NATRIURETIC PEPTIDE - Abnormal; Notable for the following components:   B Natriuretic Peptide 3,392.0 (*)    All other components within normal limits  LACTIC ACID, PLASMA - Abnormal; Notable for the following components:   Lactic Acid, Venous 2.9 (*)    All other components within normal limits  TROPONIN I (HIGH SENSITIVITY) - Abnormal; Notable for the following components:   Troponin I (High Sensitivity) 51 (*)    All other components within normal limits  RESPIRATORY PANEL BY RT PCR (FLU A&B, COVID)  CBC  LACTIC ACID, PLASMA  TROPONIN I (HIGH SENSITIVITY)  TROPONIN I (HIGH SENSITIVITY)   ____________________________________________  EKG  ED ECG REPORT I, Rudene Re, the attending physician, personally viewed and interpreted this ECG.  Normal sinus rhythm, rate of 97, normal intervals, normal axis, right bundle branch block.  Unchanged from prior. ____________________________________________  RADIOLOGY  I have personally reviewed the images performed during this visit and I agree with the Radiologist's read.   Interpretation by Radiologist:  DG Chest 2 View  Result Date: 06/02/2019 CLINICAL DATA:  86 year old male with shortness of breath EXAM: CHEST - 2 VIEW COMPARISON:  Chest radiograph dated 07/18/2015. FINDINGS: There is mild cardiomegaly with vascular congestion and probable trace bilateral pleural effusions. Pneumonia is not excluded. Clinical correlation is recommended. Median sternotomy wires and CABG vascular clips noted. Atherosclerotic calcification of the aortic arch. Osteopenia with degenerative changes of the spine. No acute osseous pathology. IMPRESSION: Mild  cardiomegaly with findings of CHF. Pneumonia is not excluded. Electronically Signed   By: Anner Crete M.D.   On: 06/02/2019 21:03   US Venous Img Lower Bilateral  Result Date: 06/02/2019 CLINICAL DATA:  Initial evaluation for acute lower extremities swelling for 3 weeks EXAM: BILATERAL LOWER EXTREMITY VENOUS DOPPLER ULTRASOUND TECHNIQUE: Gray-scale sonography with graded compression, as well as color Doppler and duplex ultrasound were performed to evaluate the lower extremity deep venous systems from the level of the common femoral vein and including  the common femoral, femoral, profunda femoral, popliteal and calf veins including the posterior tibial, peroneal and gastrocnemius veins when visible. The superficial great saphenous vein was also interrogated. Spectral Doppler was utilized to evaluate flow at rest and with distal augmentation maneuvers in the common femoral, femoral and popliteal veins. COMPARISON:  None. FINDINGS: RIGHT LOWER EXTREMITY Common Femoral Vein: No evidence of thrombus. Normal compressibility, respiratory phasicity and response to augmentation. Saphenofemoral Junction: No evidence of thrombus. Normal compressibility and flow on color Doppler imaging. Profunda Femoral Vein: No evidence of thrombus. Normal compressibility and flow on color Doppler imaging. Femoral Vein: No evidence of thrombus. Normal compressibility, respiratory phasicity and response to augmentation. Popliteal Vein: No evidence of thrombus. Normal compressibility, respiratory phasicity and response to augmentation. Calf Veins: Veins of the calf are not well visualized. No obvious thrombus or other abnormality. Superficial Great Saphenous Vein: No evidence of thrombus. Normal compressibility. Venous Reflux:  None. Other Findings: Diffuse soft tissue/interstitial edema seen throughout the subcutaneous soft tissues of the lower leg/calf. LEFT LOWER EXTREMITY Common Femoral Vein: No evidence of thrombus. Normal  compressibility, respiratory phasicity and response to augmentation. Saphenofemoral Junction: No evidence of thrombus. Normal compressibility and flow on color Doppler imaging. Profunda Femoral Vein: No evidence of thrombus. Normal compressibility and flow on color Doppler imaging. Femoral Vein: No evidence of thrombus. Normal compressibility, respiratory phasicity and response to augmentation. Popliteal Vein: No evidence of thrombus. Normal compressibility, respiratory phasicity and response to augmentation. Calf Veins: Veins of the calf are not well visualized. No obvious thrombus or other abnormality. Superficial Great Saphenous Vein: No evidence of thrombus. Normal compressibility. Venous Reflux:  None. Other Findings: Diffuse soft tissue/interstitial edema seen throughout the subcutaneous soft tissues of the lower leg/calf. IMPRESSION: 1. No evidence of deep venous thrombosis in either lower extremity. 2. Diffuse soft tissue/interstitial edema within the lower legs/calves bilaterally. Electronically Signed   By: Jeannine Boga M.D.   On: 06/02/2019 22:44     ____________________________________________   PROCEDURES  Procedure(s) performed:yes .1-3 Lead EKG Interpretation Performed by: Rudene Re, MD Authorized by: Rudene Re, MD     Interpretation: normal     ECG rate:  97   ECG rate assessment: normal     Rhythm: sinus rhythm     Ectopy: none     Conduction: normal     Critical Care performed: yes  CRITICAL CARE Performed by: Rudene Re  ?  Total critical care time: 30 min  Critical care time was exclusive of separately billable procedures and treating other patients.  Critical care was necessary to treat or prevent imminent or life-threatening deterioration.  Critical care was time spent personally by me on the following activities: development of treatment plan with patient and/or surrogate as well as nursing, discussions with consultants,  evaluation of patient's response to treatment, examination of patient, obtaining history from patient or surrogate, ordering and performing treatments and interventions, ordering and review of laboratory studies, ordering and review of radiographic studies, pulse oximetry and re-evaluation of patient's condition.  ____________________________________________   INITIAL IMPRESSION / ASSESSMENT AND PLAN / ED COURSE  86 y.o. male with a history of atrial fibrillation, CAD status post CABG, diastolic CHF, hypertension, hyperlipidemia who presents for evaluation of bilateral leg swelling x 3 weeks and SOB x 2 days.  Patient looks volume overloaded with 3+ pitting edema.  No oxygen requirement at rest but patient desats to 84% on ambulation.  Chest x-ray was reviewed by me showing findings consistent with CHF.  Labs  reviewed by me also consistent with CHF with a BNP of 3392, mild elevated troponin of 51 consistent with demand ischemia.  Patient also has mild AKI and mild hyperkalemia with potassium of 5.6.  No hyperkalemic changes on EKG.  Old records reviewed showing that patient has not had an echocardiogram since 2017.  Due to hypoxia with ambulation, decreased mobility, abnormal labs, the fact the patient lives alone, and the fact that he does not have a cardiologist I do believe patient warrants admission for echocardiogram, establishment of care of cardiology, and safe diuresis.  Will discuss with the hospitalist service.  Venous Dopplers of bilateral lower extremities were done showing no evidence of DVT.  Low suspicion for infection with no warmth, no fever, no chills, no leukocytosis.  No antibiotics indicated at this time.  _________________________ 12:17 AM on 06/03/2019 -----------------------------------------  Work up and findings were discussed with hospitalist Dr. Sidney Ace who will admit patient.       _____________________________________________ Please note:  Patient was evaluated in  Emergency Department today for the symptoms described in the history of present illness. Patient was evaluated in the context of the global COVID-19 pandemic, which necessitated consideration that the patient might be at risk for infection with the SARS-CoV-2 virus that causes COVID-19. Institutional protocols and algorithms that pertain to the evaluation of patients at risk for COVID-19 are in a state of rapid change based on information released by regulatory bodies including the CDC and federal and state organizations. These policies and algorithms were followed during the patient's care in the ED.  Some ED evaluations and interventions may be delayed as a result of limited staffing during the pandemic.   ____________________________________________   FINAL CLINICAL IMPRESSION(S) / ED DIAGNOSES   Final diagnoses:  Acute on chronic congestive heart failure, unspecified heart failure type ([place])  Demand ischemia of myocardium (HCC)  AKI (acute kidney injury) ([place])  Hyperkalemia  Acute respiratory failure with hypoxia ([place])      NEW MEDICATIONS STARTED DURING THIS VISIT:  ED Discharge Orders    None       Note:  This document was prepared using Dragon voice recognition software and may include unintentional dictation errors.    Alfred Levins, [place], MD 06/03/19 [phone]

## 2019-06-02 NOTE — ED Notes (Signed)
MD at bedside. 

## 2019-06-02 NOTE — ED Notes (Signed)
Pt able to ambulate well without assistance and did not report increased SOB. No significant WOB noted but oxygen saturation dropped and HR elevated. MD aware.

## 2019-06-03 ENCOUNTER — Encounter: Payer: Self-pay | Admitting: Family Medicine

## 2019-06-03 ENCOUNTER — Inpatient Hospital Stay: Admit: 2019-06-03 | Payer: Medicare Other

## 2019-06-03 DIAGNOSIS — E875 Hyperkalemia: Secondary | ICD-10-CM

## 2019-06-03 DIAGNOSIS — I509 Heart failure, unspecified: Secondary | ICD-10-CM

## 2019-06-03 DIAGNOSIS — I5031 Acute diastolic (congestive) heart failure: Secondary | ICD-10-CM

## 2019-06-03 DIAGNOSIS — I252 Old myocardial infarction: Secondary | ICD-10-CM | POA: Diagnosis not present

## 2019-06-03 DIAGNOSIS — I4892 Unspecified atrial flutter: Secondary | ICD-10-CM

## 2019-06-03 DIAGNOSIS — I4821 Permanent atrial fibrillation: Secondary | ICD-10-CM | POA: Diagnosis present

## 2019-06-03 DIAGNOSIS — Z8572 Personal history of non-Hodgkin lymphomas: Secondary | ICD-10-CM | POA: Diagnosis not present

## 2019-06-03 DIAGNOSIS — I2510 Atherosclerotic heart disease of native coronary artery without angina pectoris: Secondary | ICD-10-CM | POA: Diagnosis present

## 2019-06-03 DIAGNOSIS — Z20822 Contact with and (suspected) exposure to covid-19: Secondary | ICD-10-CM | POA: Diagnosis present

## 2019-06-03 DIAGNOSIS — J9601 Acute respiratory failure with hypoxia: Secondary | ICD-10-CM | POA: Diagnosis present

## 2019-06-03 DIAGNOSIS — I080 Rheumatic disorders of both mitral and aortic valves: Secondary | ICD-10-CM | POA: Diagnosis present

## 2019-06-03 DIAGNOSIS — I4510 Unspecified right bundle-branch block: Secondary | ICD-10-CM | POA: Diagnosis present

## 2019-06-03 DIAGNOSIS — I872 Venous insufficiency (chronic) (peripheral): Secondary | ICD-10-CM | POA: Diagnosis present

## 2019-06-03 DIAGNOSIS — N179 Acute kidney failure, unspecified: Secondary | ICD-10-CM | POA: Diagnosis present

## 2019-06-03 DIAGNOSIS — L03119 Cellulitis of unspecified part of limb: Secondary | ICD-10-CM

## 2019-06-03 DIAGNOSIS — E119 Type 2 diabetes mellitus without complications: Secondary | ICD-10-CM | POA: Diagnosis present

## 2019-06-03 DIAGNOSIS — Z951 Presence of aortocoronary bypass graft: Secondary | ICD-10-CM | POA: Diagnosis not present

## 2019-06-03 DIAGNOSIS — E785 Hyperlipidemia, unspecified: Secondary | ICD-10-CM | POA: Diagnosis present

## 2019-06-03 DIAGNOSIS — I110 Hypertensive heart disease with heart failure: Secondary | ICD-10-CM | POA: Diagnosis present

## 2019-06-03 DIAGNOSIS — M7989 Other specified soft tissue disorders: Secondary | ICD-10-CM | POA: Diagnosis present

## 2019-06-03 DIAGNOSIS — Z85828 Personal history of other malignant neoplasm of skin: Secondary | ICD-10-CM | POA: Diagnosis not present

## 2019-06-03 DIAGNOSIS — Z515 Encounter for palliative care: Secondary | ICD-10-CM | POA: Diagnosis present

## 2019-06-03 DIAGNOSIS — I248 Other forms of acute ischemic heart disease: Secondary | ICD-10-CM | POA: Diagnosis present

## 2019-06-03 DIAGNOSIS — H9190 Unspecified hearing loss, unspecified ear: Secondary | ICD-10-CM | POA: Diagnosis present

## 2019-06-03 DIAGNOSIS — Z66 Do not resuscitate: Secondary | ICD-10-CM | POA: Diagnosis present

## 2019-06-03 DIAGNOSIS — Z7189 Other specified counseling: Secondary | ICD-10-CM | POA: Diagnosis not present

## 2019-06-03 DIAGNOSIS — N400 Enlarged prostate without lower urinary tract symptoms: Secondary | ICD-10-CM | POA: Diagnosis present

## 2019-06-03 DIAGNOSIS — I5043 Acute on chronic combined systolic (congestive) and diastolic (congestive) heart failure: Secondary | ICD-10-CM | POA: Diagnosis present

## 2019-06-03 LAB — CBC WITH DIFFERENTIAL/PLATELET
Abs Immature Granulocytes: 0.03 10*3/uL (ref 0.00–0.07)
Basophils Absolute: 0.1 10*3/uL (ref 0.0–0.1)
Basophils Relative: 1 %
Eosinophils Absolute: 0.2 10*3/uL (ref 0.0–0.5)
Eosinophils Relative: 3 %
HCT: 45.6 % (ref 39.0–52.0)
Hemoglobin: 15.1 g/dL (ref 13.0–17.0)
Immature Granulocytes: 0 %
Lymphocytes Relative: 13 %
Lymphs Abs: 1 10*3/uL (ref 0.7–4.0)
MCH: 29.3 pg (ref 26.0–34.0)
MCHC: 33.1 g/dL (ref 30.0–36.0)
MCV: 88.4 fL (ref 80.0–100.0)
Monocytes Absolute: 0.6 10*3/uL (ref 0.1–1.0)
Monocytes Relative: 7 %
Neutro Abs: 6 10*3/uL (ref 1.7–7.7)
Neutrophils Relative %: 76 %
Platelets: 224 10*3/uL (ref 150–400)
RBC: 5.16 MIL/uL (ref 4.22–5.81)
RDW: 14.8 % (ref 11.5–15.5)
WBC: 7.9 10*3/uL (ref 4.0–10.5)
nRBC: 0 % (ref 0.0–0.2)

## 2019-06-03 LAB — GLUCOSE, CAPILLARY
Glucose-Capillary: 128 mg/dL — ABNORMAL HIGH (ref 70–99)
Glucose-Capillary: 135 mg/dL — ABNORMAL HIGH (ref 70–99)
Glucose-Capillary: 118 mg/dL — ABNORMAL HIGH (ref 70–99)
Glucose-Capillary: 209 mg/dL — ABNORMAL HIGH (ref 70–99)
Glucose-Capillary: 167 mg/dL — ABNORMAL HIGH (ref 70–99)
Glucose-Capillary: 126 mg/dL — ABNORMAL HIGH (ref 70–99)

## 2019-06-03 LAB — RESPIRATORY PANEL BY RT PCR (FLU A&B, COVID)
Influenza A by PCR: NEGATIVE
Influenza B by PCR: NEGATIVE
SARS Coronavirus 2 by RT PCR: NEGATIVE

## 2019-06-03 LAB — TSH: TSH: 7.684 u[IU]/mL — ABNORMAL HIGH (ref 0.350–4.500)

## 2019-06-03 LAB — MAGNESIUM: Magnesium: 2.2 mg/dL (ref 1.7–2.4)

## 2019-06-03 LAB — TROPONIN I (HIGH SENSITIVITY)
Troponin I (High Sensitivity): 51 ng/L — ABNORMAL HIGH (ref <18)
Troponin I (High Sensitivity): 51 ng/L — ABNORMAL HIGH (ref <18)

## 2019-06-03 MED ORDER — INSULIN ASPART 100 UNIT/ML ~~LOC~~ SOLN: 0.0000 [IU] | SUBCUTANEOUS | Status: DC

## 2019-06-03 MED ORDER — FUROSEMIDE 10 MG/ML IJ SOLN
80.0000 mg | Freq: Two times a day (BID) | INTRAMUSCULAR | Status: DC
  Administered 2019-06-03 – 2019-06-09 (×12): 80 mg via INTRAVENOUS
  Filled 2019-06-03 (×14): qty 8

## 2019-06-03 MED ORDER — SODIUM CHLORIDE 0.9% FLUSH
3.0000 mL | INTRAVENOUS | Status: DC | PRN
  Administered 2019-06-03: 3 mL via INTRAVENOUS

## 2019-06-03 MED ORDER — ONDANSETRON HCL 4 MG/2ML IJ SOLN: 4.0000 mg | Freq: Four times a day (QID) | INTRAMUSCULAR | Status: DC | PRN

## 2019-06-03 MED ORDER — ALPRAZOLAM 0.5 MG PO TABS: 0.2500 mg | ORAL_TABLET | Freq: Two times a day (BID) | ORAL | Status: DC | PRN

## 2019-06-03 MED ORDER — ACETAMINOPHEN 325 MG PO TABS: 650.0000 mg | ORAL_TABLET | ORAL | Status: DC | PRN

## 2019-06-03 MED ORDER — RAMIPRIL 10 MG PO CAPS: 10.0000 mg | ORAL_CAPSULE | Freq: Every day | ORAL | Status: DC

## 2019-06-03 MED ORDER — FINASTERIDE 5 MG PO TABS
5.0000 mg | ORAL_TABLET | Freq: Every day | ORAL | Status: DC
  Administered 2019-06-03 – 2019-06-09 (×7): 5 mg via ORAL
  Filled 2019-06-03 (×8): qty 1

## 2019-06-03 MED ORDER — ASPIRIN EC 81 MG PO TBEC
81.0000 mg | DELAYED_RELEASE_TABLET | Freq: Two times a day (BID) | ORAL | Status: DC
  Administered 2019-06-03 – 2019-06-09 (×14): 81 mg via ORAL
  Filled 2019-06-03 (×14): qty 1

## 2019-06-03 MED ORDER — NEPRO/CARBSTEADY PO LIQD
237.0000 mL | Freq: Two times a day (BID) | ORAL | Status: DC
  Administered 2019-06-04 – 2019-06-09 (×10): 237 mL via ORAL

## 2019-06-03 MED ORDER — COLLAGENASE 250 UNIT/GM EX OINT
TOPICAL_OINTMENT | CUTANEOUS | Status: DC
  Filled 2019-06-03: qty 30

## 2019-06-03 MED ORDER — APIXABAN 5 MG PO TABS
5.0000 mg | ORAL_TABLET | Freq: Two times a day (BID) | ORAL | Status: DC
  Administered 2019-06-03 – 2019-06-09 (×13): 5 mg via ORAL
  Filled 2019-06-03 (×13): qty 1

## 2019-06-03 MED ORDER — GLIMEPIRIDE 4 MG PO TABS: 4.0000 mg | ORAL_TABLET | Freq: Every day | ORAL | Status: DC

## 2019-06-03 MED ORDER — FUROSEMIDE 10 MG/ML IJ SOLN
40.0000 mg | Freq: Two times a day (BID) | INTRAMUSCULAR | Status: DC
  Administered 2019-06-03: 40 mg via INTRAVENOUS
  Filled 2019-06-03: qty 4

## 2019-06-03 MED ORDER — FUROSEMIDE 10 MG/ML IJ SOLN
40.0000 mg | Freq: Once | INTRAMUSCULAR | Status: AC
  Administered 2019-06-03: 40 mg via INTRAVENOUS
  Filled 2019-06-03: qty 4

## 2019-06-03 MED ORDER — METOPROLOL SUCCINATE ER 50 MG PO TB24
50.0000 mg | ORAL_TABLET | Freq: Every day | ORAL | Status: DC
  Administered 2019-06-03 – 2019-06-05 (×3): 50 mg via ORAL
  Filled 2019-06-03 (×3): qty 1

## 2019-06-03 MED ORDER — ZOLPIDEM TARTRATE 5 MG PO TABS: 5.0000 mg | ORAL_TABLET | Freq: Every evening | ORAL | Status: DC | PRN

## 2019-06-03 MED ORDER — SODIUM CHLORIDE 0.9 % IV SOLN
1.0000 g | INTRAVENOUS | Status: DC
  Administered 2019-06-03: 1 g via INTRAVENOUS
  Filled 2019-06-03: qty 10

## 2019-06-03 MED ORDER — SIMVASTATIN 20 MG PO TABS
20.0000 mg | ORAL_TABLET | Freq: Every day | ORAL | Status: DC
  Administered 2019-06-03 – 2019-06-08 (×7): 20 mg via ORAL
  Filled 2019-06-03 (×4): qty 1
  Filled 2019-06-03: qty 2
  Filled 2019-06-03 (×2): qty 1

## 2019-06-03 MED ORDER — SODIUM CHLORIDE 0.9 % IV SOLN: 250.0000 mL | INTRAVENOUS | Status: DC | PRN

## 2019-06-03 MED ORDER — ENOXAPARIN SODIUM 40 MG/0.4ML ~~LOC~~ SOLN: 40.0000 mg | SUBCUTANEOUS | Status: DC

## 2019-06-03 MED ORDER — INSULIN ASPART 100 UNIT/ML ~~LOC~~ SOLN
0.0000 [IU] | Freq: Three times a day (TID) | SUBCUTANEOUS | Status: DC
  Administered 2019-06-03: 2 [IU] via SUBCUTANEOUS
  Administered 2019-06-03: 3 [IU] via SUBCUTANEOUS
  Administered 2019-06-04: 2 [IU] via SUBCUTANEOUS
  Administered 2019-06-04 – 2019-06-05 (×3): 3 [IU] via SUBCUTANEOUS
  Administered 2019-06-05: 2 [IU] via SUBCUTANEOUS
  Administered 2019-06-06: 3 [IU] via SUBCUTANEOUS
  Administered 2019-06-06: 2 [IU] via SUBCUTANEOUS
  Administered 2019-06-06: 1 [IU] via SUBCUTANEOUS
  Administered 2019-06-06: 2 [IU] via SUBCUTANEOUS
  Administered 2019-06-07: 1 [IU] via SUBCUTANEOUS
  Administered 2019-06-07: 2 [IU] via SUBCUTANEOUS
  Administered 2019-06-07: 7 [IU] via SUBCUTANEOUS
  Administered 2019-06-07: 3 [IU] via SUBCUTANEOUS
  Administered 2019-06-08: 2 [IU] via SUBCUTANEOUS
  Administered 2019-06-08 (×2): 3 [IU] via SUBCUTANEOUS
  Administered 2019-06-09: 1 [IU] via SUBCUTANEOUS
  Administered 2019-06-09: 3 [IU] via SUBCUTANEOUS
  Filled 2019-06-03 (×21): qty 1

## 2019-06-03 MED ORDER — GLIMEPIRIDE 2 MG PO TABS: 2.0000 mg | ORAL_TABLET | Freq: Every day | ORAL | Status: DC

## 2019-06-03 MED ORDER — SODIUM CHLORIDE 0.9% FLUSH
3.0000 mL | Freq: Two times a day (BID) | INTRAVENOUS | Status: DC
  Administered 2019-06-03 – 2019-06-09 (×13): 3 mL via INTRAVENOUS

## 2019-06-03 NOTE — Consult Note (Signed)
[facility] Cardiology Consultation Note  Patient ID: Joseph Flowers, MRN: [id number], DOB/AGE: [birth year] 86 y.o. Admit date: 06/02/2019   Date of Consult: 06/03/2019 Primary Physician: Baxter Hire, MD Primary Cardiologist none  Chief Complaint:  Chief Complaint  Patient presents with  . Leg Swelling   Reason for Consult: Heart failure  HPI: 86 y.o. male with known previous history of atrial fibrillation flutter status post ablation diastolic dysfunction heart failure coronary artery disease status post coronary bypass graft hypertension hyperlipidemia who has had significant new onset of severe lower extremity edema shortness of breath weakness and fatigue.  The patient has been seen in the emergency room for which she has a chest x-ray showing pulmonary edema and congestive heart failure additionally the patient has severe lower extremity edema with 2+ edema and ulcerations with a troponin of 51.  The patient also has an EKG showing atrial flutter with controlled ventricular rate and right bundle branch block and in addition to that has had no evidence of chest discomfort.  He has received intravenous Lasix at this time for which the patient has had some improvements.  He is lying flat at this time and slightly improved.  Past Medical History:  Diagnosis Date  . Atrial fibrillation ([place])    a. first diagnosed 06/2015; b. CHADS2VASc => 6 (CHF, HTN, age x 2, DM, vascualr disease)  . Basal cell carcinoma (BCC)    left leg and nose  . 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 Coronary artery disease involving native heart without angina pectoris 2002   s/p CABG x 5 (Dr. Roxy Manns  . Diabetes mellitus without complication ([place])   . Diastolic CHF ([place]) 99991111  . HOH (hard of hearing)   . Hyperlipemia   . Hypertension   . Myocardial infarction [facility])    per daughter Donita Brooks  . S/P CABG x 5 2002   Dr. Roxy Manns  . Wears glasses       Surgical History:  Past Surgical History:   Procedure Laterality Date  . BASAL CELL CARCINOMA EXCISION N/A 07/12/2018   Procedure: Excision of Basal Cell Carcinoma of nose;  Surgeon: Wallace Going, DO;  Location: [facility];  Service: Plastics;  Laterality: N/A;  . CORONARY ARTERY BYPASS GRAFT    . ENDOSCOPIC RETROGRADE CHOLANGIOPANCREATOGRAPHY (ERCP) WITH PROPOFOL N/A 07/16/2015   Procedure: ENDOSCOPIC RETROGRADE CHOLANGIOPANCREATOGRAPHY (ERCP) WITH PROPOFOL;  Surgeon: Lucilla Lame, MD;  Location: [facility] ENDOSCOPY;  Service: Endoscopy;  Laterality: N/A;  . EXCISION MASS LOWER EXTREMETIES Left 07/12/2018   Procedure: Excision of Basal Cell Carcinoma of left leg with Acell Placement;  Surgeon: Wallace Going, DO;  Location: [facility];  Service: Plastics;  Laterality: Left;     Home Meds: Prior to Admission medications   Medication Sig Start Date End Date Taking? Authorizing Provider  aspirin EC 81 MG tablet Take 81 mg by mouth 2 (two) times daily.   Yes [provider]  finasteride (PROSCAR) 5 MG tablet Take 5 mg by mouth daily.  06/12/14  Yes [provider]  furosemide (LASIX) 20 MG tablet Take 20 mg by mouth daily.  08/11/14  Yes [provider]  glimepiride (AMARYL) 2 MG tablet Take 2 mg by mouth daily with breakfast.  07/19/16  Yes [provider]  glimepiride (AMARYL) 4 MG tablet Take 4 mg by mouth daily with breakfast. 06/04/15  Yes [provider]  metFORMIN (GLUCOPHAGE) 500 MG tablet Take 500 mg by mouth daily  with breakfast.  08/11/14  Yes [provider]  metoprolol succinate (TOPROL-XL) 50 MG 24 hr tablet Take 1 tablet (50 mg total) by mouth 2 (two) times daily. Take with or immediately following a meal. Patient taking differently: Take 50 mg by mouth daily. Take with or immediately following a meal. 07/20/15  Yes [name], MD  ramipril (ALTACE) 10 MG capsule Take 10 mg by mouth daily.  08/11/14  Yes [provider]  SANTYL ointment APPLY TO LEFT LEG EVERY  OTHER DAY 10/23/18  Yes [provider]  simvastatin (ZOCOR) 20 MG tablet Take 20 mg by mouth at bedtime.  08/11/14  Yes [provider]    Inpatient Medications:  . apixaban  5 mg Oral BID  . aspirin EC  81 mg Oral BID  . collagenase   Topical QODAY  . finasteride  5 mg Oral Daily  . furosemide  40 mg Intravenous Q12H  . insulin aspart  0-9 Units Subcutaneous TID PC & HS  . metoprolol succinate  50 mg Oral Daily  . simvastatin  20 mg Oral QHS  . sodium chloride flush  3 mL Intravenous Q12H   . sodium chloride      Allergies: No Known Allergies  Social History   Socioeconomic History  . Marital status: Widowed    Spouse name: Not on file  . Number of children: Not on file  . Years of education: Not on file  . Highest education level: Not on file  Occupational History  . Not on file  Tobacco Use  . Smoking status: Never Smoker  . Smokeless tobacco: Never Used  Substance and Sexual Activity  . Alcohol use: No    Alcohol/week: 0.0 standard drinks  . Drug use: No  . Sexual activity: Not on file  Other Topics Concern  . Not on file  Social History Narrative  . Not on file   Social Determinants of Health   Financial Resource Strain:   . Difficulty of Paying Living Expenses:   Food Insecurity:   . Worried About Charity fundraiser in the Last Year:   . Arboriculturist in the Last Year:   Transportation Needs:   . Film/video editor (Medical):   Marland Kitchen Lack of Transportation (Non-Medical):   Physical Activity:   . Days of Exercise per Week:   . Minutes of Exercise per Session:   Stress:   . Feeling of Stress :   Social Connections:   . Frequency of Communication with Friends and Family:   . Frequency of Social Gatherings with Friends and Family:   . Attends Religious Services:   . Active Member of Clubs or Organizations:   . Attends Archivist Meetings:   Marland Kitchen Marital Status:   Intimate Partner Violence:   . Fear of Current or  Ex-Partner:   . Emotionally Abused:   Marland Kitchen Physically Abused:   . Sexually Abused:      Family History  Problem Relation Age of Onset  . Cancer Mother   . Cancer Father      Review of Systems Positive for shortness of breath edema Negative for: General:  chills, fever, night sweats or weight changes.  Cardiovascular: PND orthopnea syncope dizziness  Dermatological skin lesions rashes Respiratory: Cough congestion Urologic: Frequent urination urination at night and hematuria Abdominal: negative for nausea, vomiting, diarrhea, bright red blood per rectum, melena, or hematemesis Neurologic: negative for visual changes, and/or hearing changes  All other systems reviewed  and are otherwise negative except as noted above.  Labs: No results for input(s): CKTOTAL, CKMB, TROPONINI in the last 72 hours. Lab Results  Component Value Date   WBC 7.9 06/03/2019   HGB 15.1 06/03/2019   HCT 45.6 06/03/2019   MCV 88.4 06/03/2019   PLT 224 06/03/2019    Recent Labs  Lab 06/02/19 2124  NA 138  K 5.6*  CL 104  CO2 24  BUN 38*  CREATININE 1.21  CALCIUM 9.5  PROT 6.5  BILITOT 1.2  ALKPHOS 153*  ALT 36  AST 37  GLUCOSE 179*   No results found for: CHOL, HDL, LDLCALC, TRIG No results found for: DDIMER  Radiology/Studies:  DG Chest 2 View  Result Date: 06/02/2019 CLINICAL DATA:  86 year old male with shortness of breath EXAM: CHEST - 2 VIEW COMPARISON:  Chest radiograph dated 07/18/2015. FINDINGS: There is mild cardiomegaly with vascular congestion and probable trace bilateral pleural effusions. Pneumonia is not excluded. Clinical correlation is recommended. Median sternotomy wires and CABG vascular clips noted. Atherosclerotic calcification of the aortic arch. Osteopenia with degenerative changes of the spine. No acute osseous pathology. IMPRESSION: Mild cardiomegaly with findings of CHF. Pneumonia is not excluded. Electronically Signed   By: Anner Crete M.D.   On: 06/02/2019 21:03    US Venous Img Lower Bilateral  Result Date: 06/02/2019 CLINICAL DATA:  Initial evaluation for acute lower extremities swelling for 3 weeks EXAM: BILATERAL LOWER EXTREMITY VENOUS DOPPLER ULTRASOUND TECHNIQUE: Gray-scale sonography with graded compression, as well as color Doppler and duplex ultrasound were performed to evaluate the lower extremity deep venous systems from the level of the common femoral vein and including the common femoral, femoral, profunda femoral, popliteal and calf veins including the posterior tibial, peroneal and gastrocnemius veins when visible. The superficial great saphenous vein was also interrogated. Spectral Doppler was utilized to evaluate flow at rest and with distal augmentation maneuvers in the common femoral, femoral and popliteal veins. COMPARISON:  None. FINDINGS: RIGHT LOWER EXTREMITY Common Femoral Vein: No evidence of thrombus. Normal compressibility, respiratory phasicity and response to augmentation. Saphenofemoral Junction: No evidence of thrombus. Normal compressibility and flow on color Doppler imaging. Profunda Femoral Vein: No evidence of thrombus. Normal compressibility and flow on color Doppler imaging. Femoral Vein: No evidence of thrombus. Normal compressibility, respiratory phasicity and response to augmentation. Popliteal Vein: No evidence of thrombus. Normal compressibility, respiratory phasicity and response to augmentation. Calf Veins: Veins of the calf are not well visualized. No obvious thrombus or other abnormality. Superficial Great Saphenous Vein: No evidence of thrombus. Normal compressibility. Venous Reflux:  None. Other Findings: Diffuse soft tissue/interstitial edema seen throughout the subcutaneous soft tissues of the lower leg/calf. LEFT LOWER EXTREMITY Common Femoral Vein: No evidence of thrombus. Normal compressibility, respiratory phasicity and response to augmentation. Saphenofemoral Junction: No evidence of thrombus. Normal compressibility  and flow on color Doppler imaging. Profunda Femoral Vein: No evidence of thrombus. Normal compressibility and flow on color Doppler imaging. Femoral Vein: No evidence of thrombus. Normal compressibility, respiratory phasicity and response to augmentation. Popliteal Vein: No evidence of thrombus. Normal compressibility, respiratory phasicity and response to augmentation. Calf Veins: Veins of the calf are not well visualized. No obvious thrombus or other abnormality. Superficial Great Saphenous Vein: No evidence of thrombus. Normal compressibility. Venous Reflux:  None. Other Findings: Diffuse soft tissue/interstitial edema seen throughout the subcutaneous soft tissues of the lower leg/calf. IMPRESSION: 1. No evidence of deep venous thrombosis in either lower extremity. 2. Diffuse soft tissue/interstitial edema within  the lower legs/calves bilaterally. Electronically Signed   By: Jeannine Boga M.D.   On: 06/02/2019 22:44    EKG: Atrial flutter with controlled ventricular rate and right bundle branch block  Weights: Filed Weights   06/02/19 2039  Weight: 83.5 kg     Physical Exam: Blood pressure 102/81, pulse 99, temperature 97.7 F (36.5 C), temperature source Oral, resp. rate 18, weight 83.5 kg, SpO2 100 %. Body mass index is 26.4 kg/m. General: Well developed, well nourished, in no acute distress. Head eyes ears nose throat: Normocephalic, atraumatic, sclera non-icteric, no xanthomas, nares are without discharge. No apparent thyromegaly and/or mass  Lungs: Normal respiratory effort.  no wheezes, basilar rales, some rhonchi.  Heart: RRR with normal S1 S2. no murmur gallop, no rub, PMI is normal size and placement, carotid upstroke normal without bruit, jugular venous pressure is normal Abdomen: Soft, non-tender, non-distended with normoactive bowel sounds. No hepatomegaly. No rebound/guarding. No obvious abdominal masses. Abdominal aorta is normal size without bruit Extremities: 2+ edema.  no cyanosis, no clubbing, positive ulcers  Peripheral : 2+ bilateral upper extremity pulses, 2+ bilateral femoral pulses, 2+ bilateral dorsal pedal pulse Neuro: Alert and oriented. No facial asymmetry. No focal deficit. Moves all extremities spontaneously. Musculoskeletal: Normal muscle tone without kyphosis Psych:  Responds to questions appropriately with a normal affect.    Assessment: 86 year old male with acute on chronic diastolic dysfunction heart failure coronary artery disease status post coronary to bypass graft hypertension hyperlipidemia diabetes and atrial flutter with controlled ventricular rate needing further medical management  Plan: 1.  Intravenous Lasix for lower extremity edema pulmonary edema and acute on chronic diastolic dysfunction heart failure 2.  Echocardiogram for LV systolic dysfunction valvular heart disease contributing to that above and adjustments as necessary 3.  Further troponin evaluation to assess for the possibility of myocardial infarction 4.  Continue medication management for heart rate control of atrial flutter with a goal heart rate between 60 and 80 bpm 5.  Further diagnostic testing and treatment options after above  Signed, Corey Skains M.D. [facility] 06/03/2019, 8:44 AM

## 2019-06-03 NOTE — Progress Notes (Signed)
Initial Nutrition Assessment  RD working remotely.  DOCUMENTATION CODES:   Not applicable  INTERVENTION:  Provide Nepro Shake po BID, each supplement provides 425 kcal and 19 grams protein.  NUTRITION DIAGNOSIS:   Inadequate oral intake related to decreased appetite as evidenced by meal completion < 50%.  GOAL:   Patient will meet greater than or equal to 90% of their needs  MONITOR:   PO intake, Supplement acceptance, Labs, Weight trends, I & O's  REASON FOR ASSESSMENT:   Consult Diet education  ASSESSMENT:   86 year old male with PMHx of DM, HTN, HLD, hx lymphoma, CHF, CAD s/p CABG x5 2002, A-fib admitted with acute CHF, bilateral lower extremity cellulitis, and hyperkalemia.   Attempted to call patient over the phone but he was unable to answer. According to chart patient is only eating 30% of his meals at this time. In setting of advanced age patient is at risk for loss of lean body mass and development of malnutrition if poor PO intake continues. He will benefit from oral nutrition supplement at this time to help meet calorie/protein needs. Will use a low potassium supplement at this time in setting of hyperkalemia. Patient is not appropriate for diet education at this time.  According to chart patient was 86.3 kg on 02/01/2019 and 86 kg on 03/08/2019. He is currently documented to be 88.9 kg (196 lbs) but this weight may be falsely elevated from fluid status.  Medications reviewed and include: Eliquis, Lasix 80 mg BID IV, Novolog 0-9 units TID and QHS.  Labs reviewed: CBG 118-209, Potassium 5.6, BUN 38.  Patient is at risk for malnutrition. Unable to determine if patient meets criteria for malnutrition at this time.  NUTRITION - FOCUSED PHYSICAL EXAM:  Unable to complete at this time.  Diet Order:   Diet Order            Diet heart healthy/carb modified Room service appropriate? Yes; Fluid consistency: Thin; Fluid restriction: 1500 mL Fluid  Diet effective now             EDUCATION NEEDS:   Not appropriate for education at this time(Patient with poor PO intake and at risk for malnutrition.)  Skin:  Skin Assessment: (skin assessment not completed at this time)  Last BM:  06/03/2019 medium type 6  Height:   Ht Readings from Last 1 Encounters:  06/03/19 5\' 10"  (1.778 m)   Weight:   Wt Readings from Last 1 Encounters:  06/03/19 88.9 kg   BMI:  Body mass index is 28.12 kg/m.  Estimated Nutritional Needs:   Kcal:  1900-2100  Protein:  100-110 grams  Fluid:  per MD  Jacklynn Barnacle, MS, RD, LDN Pager number available on Amion

## 2019-06-03 NOTE — Progress Notes (Signed)
REDs reading on admission is 44%

## 2019-06-03 NOTE — H&P (Addendum)
Joseph Flowers at [place] NAME: Joseph Flowers    MR#:  [id number]  DATE OF BIRTH:  Nov 24, 1927  DATE OF ADMISSION:  06/02/2019  PRIMARY CARE PHYSICIAN: Baxter Hire, MD   REQUESTING/REFERRING PHYSICIAN: Gonzella Lex, MD CHIEF COMPLAINT:   Chief Complaint  Patient presents with  . Leg Swelling    HISTORY OF PRESENT ILLNESS:  [name]  is a 86 y.o. Caucasian male with a known history of atrial ablation, diastolic CHF, type diabetes mellitus, coronary artery disease status post CABG, hypertension and dyslipidemia, who presented to the emergency room with acute onset of worsening dyspnea as well as lower extremity edema.  The patient has been having dyspnea on exertion.  He denies any significant cough or wheezing. He was noted to have low pulse oximetry that was down to 84% on ambulation on room air.  No fever or chills.  No nausea or vomiting or abdominal pain.  Denies any dysuria, oliguria or hematuria or flank pain.  Upon presentation to the emergency room, vital signs were within normal.  Labs revealed BNP of 3392 and high-sensitivity troponin I of 51 twice.  CMP was remarkable for a BUN of 38 and creatinine 1.21 and potassium was 5.6 and CBC was within normal.  Bilateral lower extremity venous duplex revealed no evidence for DVT.  It showed diffuse soft tissue/interstitial edema of the lower legs and calves bilaterally. EKG showed atrial flutter with variable AV block with rate of 97, right bundle branch block with T wave inversion anteroseptally and T wave inversion laterally.  The patient's most recent 2D echo was in 2017 and revealed an EF of 50 to 55%.  Diastolic function was difficult to assess then.  The patient was given 40 mg IV Lasix.  He will be admitted to a cardiac progressive unit bed for further evaluation and management. PAST MEDICAL HISTORY:   Past Medical History:  Diagnosis Date  . Atrial fibrillation ([place])    a. first diagnosed  06/2015; b. CHADS2VASc => 6 (CHF, HTN, age x 2, DM, vascualr disease)  . Basal cell carcinoma (BCC)    left leg and nose  . 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 Coronary artery disease involving native heart without angina pectoris 2002   s/p CABG x 5 (Dr. Roxy Manns  . Diabetes mellitus without complication ([place])   . Diastolic CHF ([place]) 99991111  . HOH (hard of hearing)   . Hyperlipemia   . Hypertension   . Myocardial infarction [facility])    per daughter Donita Brooks  . S/P CABG x 5 2002   Dr. Roxy Manns  . Wears glasses     PAST SURGICAL HISTORY:   Past Surgical History:  Procedure Laterality Date  . BASAL CELL CARCINOMA EXCISION N/A 07/12/2018   Procedure: Excision of Basal Cell Carcinoma of nose;  Surgeon: Wallace Going, DO;  Location: [facility];  Service: Plastics;  Laterality: N/A;  . CORONARY ARTERY BYPASS GRAFT    . ENDOSCOPIC RETROGRADE CHOLANGIOPANCREATOGRAPHY (ERCP) WITH PROPOFOL N/A 07/16/2015   Procedure: ENDOSCOPIC RETROGRADE CHOLANGIOPANCREATOGRAPHY (ERCP) WITH PROPOFOL;  Surgeon: Lucilla Lame, MD;  Location: [facility];  Service: Endoscopy;  Laterality: N/A;  . EXCISION MASS LOWER EXTREMETIES Left 07/12/2018   Procedure: Excision of Basal Cell Carcinoma of left leg with Acell Placement;  Surgeon: Wallace Going, DO;  Location: [place];  Service: Plastics;  Laterality: Left;    SOCIAL HISTORY:   Social History   Tobacco  Use  . Smoking status: Never Smoker  . Smokeless tobacco: Never Used  Substance Use Topics  . Alcohol use: No    Alcohol/week: 0.0 standard drinks    FAMILY HISTORY:   Family History  Problem Relation Age of Onset  . Cancer Mother   . Cancer Father     DRUG ALLERGIES:  No Known Allergies  REVIEW OF SYSTEMS:   ROS As per history of present illness. All pertinent systems were reviewed above. Constitutional,  HEENT, cardiovascular, respiratory, GI, GU, musculoskeletal, neuro, psychiatric, endocrine,  integumentary and hematologic systems  were reviewed and are otherwise  negative/unremarkable except for positive findings mentioned above in the HPI.   MEDICATIONS AT HOME:   Prior to Admission medications   Medication Sig Start Date End Date Taking? Authorizing Provider  aspirin EC 81 MG tablet Take 81 mg by mouth 2 (two) times daily.   Yes [provider]  finasteride (PROSCAR) 5 MG tablet Take 5 mg by mouth daily.  06/12/14  Yes [provider]  furosemide (LASIX) 20 MG tablet Take 20 mg by mouth daily.  08/11/14  Yes [provider]  glimepiride (AMARYL) 2 MG tablet Take 2 mg by mouth daily with breakfast.  07/19/16  Yes [provider]  glimepiride (AMARYL) 4 MG tablet Take 4 mg by mouth daily with breakfast. 06/04/15  Yes [provider]  metFORMIN (GLUCOPHAGE) 500 MG tablet Take 500 mg by mouth daily with breakfast.  08/11/14  Yes [provider]  metoprolol succinate (TOPROL-XL) 50 MG 24 hr tablet Take 1 tablet (50 mg total) by mouth 2 (two) times daily. Take with or immediately following a meal. Patient taking differently: Take 50 mg by mouth daily. Take with or immediately following a meal. 07/20/15  Yes [name], MD  ramipril (ALTACE) 10 MG capsule Take 10 mg by mouth daily.  08/11/14  Yes [provider]  SANTYL ointment APPLY TO LEFT LEG EVERY OTHER DAY 10/23/18  Yes [provider]  simvastatin (ZOCOR) 20 MG tablet Take 20 mg by mouth at bedtime.  08/11/14  Yes [provider]      VITAL SIGNS:  Blood pressure 117/83, pulse (!) 108, temperature 97.7 F (36.5 C), temperature source Oral, resp. rate (!) 26, weight 83.5 kg, SpO2 97 %.  PHYSICAL EXAMINATION:  Physical Exam  GENERAL:  86 y.o.-year-old Caucasian male patient lying in the bed with mild respiratory distress with conversational dyspnea. EYES: Pupils equal, round, reactive to light and accommodation. No scleral icterus. Extraocular muscles intact.  HEENT: Head  atraumatic, normocephalic. Oropharynx and nasopharynx clear.  NECK:  Supple, no jugular venous distention. No thyroid enlargement, no tenderness.  LUNGS: Diminished bibasilar breath sounds with bibasal rales. CARDIOVASCULAR: Regular rate and rhythm, S1, S2 normal. No murmurs, rubs, or gallops.  ABDOMEN: Soft, nondistended, nontender. Bowel sounds present. No organomegaly or mass.  EXTREMITIES: 2-3+ bilateral lower extremity pitting edema with erythema with no clubbing or cyanosis. NEUROLOGIC: Cranial nerves II through XII are intact. Muscle strength 5/5 in all extremities. Sensation intact. Gait not checked.  PSYCHIATRIC: The patient is alert and oriented x 3.  Normal affect and good eye contact. SKIN: Bilateral lower extremity erythema with extension from his ankles to upper legs with demarcation, mild tenderness without warmth.    LABORATORY PANEL:   CBC Recent Labs  Lab 06/02/19 2124  WBC 8.0  HGB 14.7  HCT 45.1  PLT 224   ------------------------------------------------------------------------------------------------------------------  Chemistries  Recent Labs  Lab 06/02/19 2124  NA 138  K 5.6*  CL 104  CO2 24  GLUCOSE 179*  BUN 38*  CREATININE 1.21  CALCIUM 9.5  AST 37  ALT 36  ALKPHOS 153*  BILITOT 1.2   ------------------------------------------------------------------------------------------------------------------  Cardiac Enzymes No results for input(s): TROPONINI in the last 168 hours. ------------------------------------------------------------------------------------------------------------------  RADIOLOGY:  DG Chest 2 View  Result Date: 06/02/2019 CLINICAL DATA:  86 year old male with shortness of breath EXAM: CHEST - 2 VIEW COMPARISON:  Chest radiograph dated 07/18/2015. FINDINGS: There is mild cardiomegaly with vascular congestion and probable trace bilateral pleural effusions. Pneumonia is not excluded. Clinical correlation is recommended. Median  sternotomy wires and CABG vascular clips noted. Atherosclerotic calcification of the aortic arch. Osteopenia with degenerative changes of the spine. No acute osseous pathology. IMPRESSION: Mild cardiomegaly with findings of CHF. Pneumonia is not excluded. Electronically Signed   By: Anner Crete M.D.   On: 06/02/2019 21:03   US Venous Img Lower Bilateral  Result Date: 06/02/2019 CLINICAL DATA:  Initial evaluation for acute lower extremities swelling for 3 weeks EXAM: BILATERAL LOWER EXTREMITY VENOUS DOPPLER ULTRASOUND TECHNIQUE: Gray-scale sonography with graded compression, as well as color Doppler and duplex ultrasound were performed to evaluate the lower extremity deep venous systems from the level of the common femoral vein and including the common femoral, femoral, profunda femoral, popliteal and calf veins including the posterior tibial, peroneal and gastrocnemius veins when visible. The superficial great saphenous vein was also interrogated. Spectral Doppler was utilized to evaluate flow at rest and with distal augmentation maneuvers in the common femoral, femoral and popliteal veins. COMPARISON:  None. FINDINGS: RIGHT LOWER EXTREMITY Common Femoral Vein: No evidence of thrombus. Normal compressibility, respiratory phasicity and response to augmentation. Saphenofemoral Junction: No evidence of thrombus. Normal compressibility and flow on color Doppler imaging. Profunda Femoral Vein: No evidence of thrombus. Normal compressibility and flow on color Doppler imaging. Femoral Vein: No evidence of thrombus. Normal compressibility, respiratory phasicity and response to augmentation. Popliteal Vein: No evidence of thrombus. Normal compressibility, respiratory phasicity and response to augmentation. Calf Veins: Veins of the calf are not well visualized. No obvious thrombus or other abnormality. Superficial Great Saphenous Vein: No evidence of thrombus. Normal compressibility. Venous Reflux:  None. Other  Findings: Diffuse soft tissue/interstitial edema seen throughout the subcutaneous soft tissues of the lower leg/calf. LEFT LOWER EXTREMITY Common Femoral Vein: No evidence of thrombus. Normal compressibility, respiratory phasicity and response to augmentation. Saphenofemoral Junction: No evidence of thrombus. Normal compressibility and flow on color Doppler imaging. Profunda Femoral Vein: No evidence of thrombus. Normal compressibility and flow on color Doppler imaging. Femoral Vein: No evidence of thrombus. Normal compressibility, respiratory phasicity and response to augmentation. Popliteal Vein: No evidence of thrombus. Normal compressibility, respiratory phasicity and response to augmentation. Calf Veins: Veins of the calf are not well visualized. No obvious thrombus or other abnormality. Superficial Great Saphenous Vein: No evidence of thrombus. Normal compressibility. Venous Reflux:  None. Other Findings: Diffuse soft tissue/interstitial edema seen throughout the subcutaneous soft tissues of the lower leg/calf. IMPRESSION: 1. No evidence of deep venous thrombosis in either lower extremity. 2. Diffuse soft tissue/interstitial edema within the lower legs/calves bilaterally. Electronically Signed   By: Jeannine Boga M.D.   On: 06/02/2019 22:44      IMPRESSION AND PLAN:   1.  Acute CHF, likely diastolic. -The patient will be admitted to a cardiac progressive unit bed. -He will be diuresed with IV Lasix. -We will follow serial troponin I's. -We will obtain a 2D echo  and a cardiology consultation with [facility] clinic.   -I notified Dr. Nehemiah Massed about the patient.  2.  Bilateral lower extremity moderate nonpurulent cellulitis. -The patient will be placed on IV Rocephin. -Warm compresses will be applied.  3.  Hyperkalemia. -We will hold ramipril. -This should improve with diuresis with IV Lasix.  4.  Atrial flutter with history of atrial fibrillation. -The patient CHA2DS2-VASc score is  6. -She will be started on p.o. Eliquis.  The patient has no current clear contraindications to anticoagulation. -We will continue Toprol-XL.  3.  Type 2 diabetes mellitus. -The patient will be placed on supplement coverage with NovoLog. -We will continue Amaryl and hold off Glucophage.  4.  Hypertension. -We will continue Toprol-XL and hold ramipril.  5.  BPH. -Continue Proscar.  6.  Dyslipidemia. -We will continue statin therapy.  7.  Coronary artery disease. -We will continue beta-blocker therapy as well as aspirin and ramipril.  8.  DVT prophylaxis. -The patient will be on p.o. Eliquis.  All the records are reviewed and case discussed with ED provider. The plan of care was discussed in details with the patient.  I answered all questions. The patient agreed to proceed with the above mentioned plan. Further management will depend upon hospital course.   CODE STATUS: Full code  TOTAL TIME TAKING CARE OF THIS PATIENT: 55 minutes.    Christel Mormon M.D on 06/03/2019 at 12:29 AM  [facility]   From 7 PM-7 AM, contact night-coverage [email or web address]  CC: Primary care physician; Baxter Hire, MD   Note: This dictation was prepared with Dragon dictation along with smaller phrase technology. Any transcriptional errors that result from this process are unintentional.

## 2019-06-03 NOTE — Progress Notes (Signed)
PROGRESS NOTE    Joseph Flowers  [id number] DOB: [birth year] DOA: 06/02/2019 PCP: Baxter Hire, MD      Brief Narrative:  Joseph Flowers is a 86 y.o. M with permanent A. fib status post atrial ablation on Eliquis, dCHF, DM, CAD s/p CABG remote, and HTN who presented with several days worsening dyspnea as well as severe lower extremity edema.  He noted his home oxygen down to 84% on ambulation.  In the ER he 04/27/1990, at bedtime troponin elevated slightly but stable.  Creatinine 1.2, potassium 5.6.  Bilateral lower extremity duplex negative for DVT, but there is severe swelling and redness of bilateral calves.  EKG showed atrial flutter with normal rate.  Patient was started on IV Lasix and admitted to the hospitalist service.            Assessment & Plan:  Acute on chronic diastolic CHF -Furosemide 80 mg IV twice a day  -K supplement -Strict I/Os, daily weights, telemetry  -Daily monitoring renal function -Echo pending -Consult Cardiology    Permanent atrial fibrillation status post ablation -Continue Eliquis -Continue metoprolol  Diabetes -Continue sliding scale corrections -Hold home Metformin and glimepiride  Coronary disease, secondary prevention Hypertension -Hold home ramipril -Continue home simvastatin  Cellulitis, ruled out -Stop antibiotics  Hypokalemia -Hold metoprolol -Continue IV Lasix  BPH -Continue finasteride       Disposition: The patient was admitted with acute on chronic diastolic CHF.   I will discharge when his fluid status has been resolved and he was off oxygen.           MDM: The below labs and imaging reports were reviewed and summarized above.  Medication management as above.  This is a no charge note, further details, please see H&P by Dr. Alanda Slim from earlier today.   DVT prophylaxis: Not applicable, on Eliquis Code Status: Full code Family Communication: Daughter by phone    Consultants:    Cardiology  Procedures:   Echo  Antimicrobials:      Culture data:              Subjective: Patient is feeling tired, weak.  Somewhat out of breath.  Objective: Vitals:   06/03/19 0930 06/03/19 1100 06/03/19 1216 06/03/19 1659  BP: 101/75 111/82 109/73 102/83  Pulse:   95 72  Resp:   16 18  Temp:   (!) 97.5 F (36.4 C) 97.7 F (36.5 C)  TempSrc:      SpO2:  100% 97% 96%  Weight:   88.9 kg   Height:   5\' 10"  (1.778 m)     Intake/Output Summary (Last 24 hours) at 06/03/2019 1742 Last data filed at 06/03/2019 1633 Gross per 24 hour  Intake 340 ml  Output 675 ml  Net -335 ml   Filed Weights   06/02/19 2039 06/03/19 1216  Weight: 83.5 kg 88.9 kg    Examination: The patient was seen and examined    Data Reviewed: I have personally reviewed following labs and imaging studies:  CBC: Recent Labs  Lab 06/02/19 2124 06/03/19 0336  WBC 8.0 7.9  NEUTROABS  --  6.0  HGB 14.7 15.1  HCT 45.1 45.6  MCV 89.7 88.4  PLT 224 [id number]   Basic Metabolic Panel: Recent Labs  Lab 06/02/19 2124 06/03/19 0336  NA 138  --   K 5.6*  --   CL 104  --   CO2 24  --   GLUCOSE 179*  --   BUN 38*  --  CREATININE 1.21  --   CALCIUM 9.5  --   MG  --  2.2   GFR: Estimated Creatinine Clearance: 46.5 mL/min (by C-G formula based on SCr of 1.21 mg/dL). Liver Function Tests: Recent Labs  Lab 06/02/19 2124  AST 37  ALT 36  ALKPHOS 153*  BILITOT 1.2  PROT 6.5  ALBUMIN 3.1*   No results for input(s): LIPASE, AMYLASE in the last 168 hours. No results for input(s): AMMONIA in the last 168 hours. Coagulation Profile: No results for input(s): INR, PROTIME in the last 168 hours. Cardiac Enzymes: No results for input(s): CKTOTAL, CKMB, CKMBINDEX, TROPONINI in the last 168 hours. BNP (last 3 results) No results for input(s): PROBNP in the last 8760 hours. HbA1C: No results for input(s): HGBA1C in the last 72 hours. CBG: Recent Labs  Lab 06/03/19 0349  06/03/19 0534 06/03/19 0902 06/03/19 1217 06/03/19 1657  GLUCAP 128* 135* 118* 209* 167*   Lipid Profile: No results for input(s): CHOL, HDL, LDLCALC, TRIG, CHOLHDL, LDLDIRECT in the last 72 hours. Thyroid Function Tests: Recent Labs    06/03/19 0336  TSH 7.684*   Anemia Panel: No results for input(s): VITAMINB12, FOLATE, FERRITIN, TIBC, IRON, RETICCTPCT in the last 72 hours. Urine analysis:    Component Value Date/Time   COLORURINE YELLOW (A) 07/14/2015 1035   APPEARANCEUR CLEAR (A) 07/14/2015 1035   LABSPEC 1.026 07/14/2015 1035   PHURINE 5.0 07/14/2015 1035   GLUCOSEU >500 (A) 07/14/2015 1035   HGBUR 1+ (A) 07/14/2015 1035   BILIRUBINUR NEGATIVE 07/14/2015 1035   KETONESUR 1+ (A) 07/14/2015 1035   PROTEINUR 30 (A) 07/14/2015 1035   NITRITE NEGATIVE 07/14/2015 1035   LEUKOCYTESUR NEGATIVE 07/14/2015 1035   Sepsis Labs: @LABRCNTIP (procalcitonin:4,lacticacidven:4)  ) Recent Results (from the past 240 hour(s))  Respiratory Panel by RT PCR (Flu A&B, Covid) - Nasopharyngeal Swab     Status: None   Collection Time: 06/03/19  1:34 AM   Specimen: Nasopharyngeal Swab  Result Value Ref Range Status   SARS Coronavirus 2 by RT PCR NEGATIVE NEGATIVE Final    Comment: (NOTE) SARS-CoV-2 target nucleic acids are NOT DETECTED. The SARS-CoV-2 RNA is generally detectable in upper respiratoy specimens during the acute phase of infection. The lowest concentration of SARS-CoV-2 viral copies this assay can detect is 131 copies/mL. A negative result does not preclude SARS-Cov-2 infection and should not be used as the sole basis for treatment or other patient management decisions. A negative result may occur with  improper specimen collection/handling, submission of specimen other than nasopharyngeal swab, presence of viral mutation(s) within the areas targeted by this assay, and inadequate number of viral copies (<131 copies/mL). A negative result must be combined with  clinical observations, patient history, and epidemiological information. The expected result is Negative. Fact Sheet for Patients:  PinkCheek.be Fact Sheet for Healthcare Providers:  GravelBags.it This test is not yet ap proved or cleared by the [place] FDA and  has been authorized for detection and/or diagnosis of SARS-CoV-2 by FDA under an Emergency Use Authorization (EUA). This EUA will remain  in effect (meaning this test can be used) for the duration of the COVID-19 declaration under Section 564(b)(1) of the Act, 21 U.S.C. section 360bbb-3(b)(1), unless the authorization is terminated or revoked sooner.    Influenza A by PCR NEGATIVE NEGATIVE Final   Influenza B by PCR NEGATIVE NEGATIVE Final    Comment: (NOTE) The Xpert Xpress SARS-CoV-2/FLU/RSV assay is intended as an aid in  the diagnosis of influenza from  Nasopharyngeal swab specimens and  should not be used as a sole basis for treatment. Nasal washings and  aspirates are unacceptable for Xpert Xpress SARS-CoV-2/FLU/RSV  testing. Fact Sheet for Patients: PinkCheek.be Fact Sheet for Healthcare Providers: GravelBags.it This test is not yet approved or cleared by the [place] FDA and  has been authorized for detection and/or diagnosis of SARS-CoV-2 by  FDA under an Emergency Use Authorization (EUA). This EUA will remain  in effect (meaning this test can be used) for the duration of the  Covid-19 declaration under Section 564(b)(1) of the Act, 21  U.S.C. section 360bbb-3(b)(1), unless the authorization is  terminated or revoked. Performed at [facility], [address] [place], [place] [address]          Radiology Studies: DG Chest 2 View  Result Date: 06/02/2019 CLINICAL DATA:  86 year old male with shortness of breath EXAM: CHEST - 2 VIEW COMPARISON:  Chest radiograph dated  07/18/2015. FINDINGS: There is mild cardiomegaly with vascular congestion and probable trace bilateral pleural effusions. Pneumonia is not excluded. Clinical correlation is recommended. Median sternotomy wires and CABG vascular clips noted. Atherosclerotic calcification of the aortic arch. Osteopenia with degenerative changes of the spine. No acute osseous pathology. IMPRESSION: Mild cardiomegaly with findings of CHF. Pneumonia is not excluded. Electronically Signed   By: Anner Crete M.D.   On: 06/02/2019 21:03   US Venous Img Lower Bilateral  Result Date: 06/02/2019 CLINICAL DATA:  Initial evaluation for acute lower extremities swelling for 3 weeks EXAM: BILATERAL LOWER EXTREMITY VENOUS DOPPLER ULTRASOUND TECHNIQUE: Gray-scale sonography with graded compression, as well as color Doppler and duplex ultrasound were performed to evaluate the lower extremity deep venous systems from the level of the common femoral vein and including the common femoral, femoral, profunda femoral, popliteal and calf veins including the posterior tibial, peroneal and gastrocnemius veins when visible. The superficial great saphenous vein was also interrogated. Spectral Doppler was utilized to evaluate flow at rest and with distal augmentation maneuvers in the common femoral, femoral and popliteal veins. COMPARISON:  None. FINDINGS: RIGHT LOWER EXTREMITY Common Femoral Vein: No evidence of thrombus. Normal compressibility, respiratory phasicity and response to augmentation. Saphenofemoral Junction: No evidence of thrombus. Normal compressibility and flow on color Doppler imaging. Profunda Femoral Vein: No evidence of thrombus. Normal compressibility and flow on color Doppler imaging. Femoral Vein: No evidence of thrombus. Normal compressibility, respiratory phasicity and response to augmentation. Popliteal Vein: No evidence of thrombus. Normal compressibility, respiratory phasicity and response to augmentation. Calf Veins: Veins of  the calf are not well visualized. No obvious thrombus or other abnormality. Superficial Great Saphenous Vein: No evidence of thrombus. Normal compressibility. Venous Reflux:  None. Other Findings: Diffuse soft tissue/interstitial edema seen throughout the subcutaneous soft tissues of the lower leg/calf. LEFT LOWER EXTREMITY Common Femoral Vein: No evidence of thrombus. Normal compressibility, respiratory phasicity and response to augmentation. Saphenofemoral Junction: No evidence of thrombus. Normal compressibility and flow on color Doppler imaging. Profunda Femoral Vein: No evidence of thrombus. Normal compressibility and flow on color Doppler imaging. Femoral Vein: No evidence of thrombus. Normal compressibility, respiratory phasicity and response to augmentation. Popliteal Vein: No evidence of thrombus. Normal compressibility, respiratory phasicity and response to augmentation. Calf Veins: Veins of the calf are not well visualized. No obvious thrombus or other abnormality. Superficial Great Saphenous Vein: No evidence of thrombus. Normal compressibility. Venous Reflux:  None. Other Findings: Diffuse soft tissue/interstitial edema seen throughout the subcutaneous soft tissues of the lower leg/calf. IMPRESSION: 1.  No evidence of deep venous thrombosis in either lower extremity. 2. Diffuse soft tissue/interstitial edema within the lower legs/calves bilaterally. Electronically Signed   By: Jeannine Boga M.D.   On: 06/02/2019 22:44        Scheduled Meds: . apixaban  5 mg Oral BID  . aspirin EC  81 mg Oral BID  . collagenase   Topical QODAY  . [START ON 06/04/2019] feeding supplement (NEPRO CARB STEADY)  237 mL Oral BID BM  . finasteride  5 mg Oral Daily  . furosemide  80 mg Intravenous BID  . insulin aspart  0-9 Units Subcutaneous TID PC & HS  . metoprolol succinate  50 mg Oral Daily  . simvastatin  20 mg Oral QHS  . sodium chloride flush  3 mL Intravenous Q12H   Continuous Infusions: .  sodium chloride       LOS: 0 days    Time spent: 20 minutes    Edwin Dada, MD [facility] 06/03/2019, 5:42 PM     Please page though [employer] or Epic secure chat:  For [employer], Adult nurse

## 2019-06-03 NOTE — Progress Notes (Signed)
MD notified of elevated lactic acid and potassium. No new orders at this time. I will continue to assess.

## 2019-06-04 ENCOUNTER — Inpatient Hospital Stay
Admit: 2019-06-04 | Discharge: 2019-06-04 | Disposition: A | Discharge status: Home / Self Care | Payer: Medicare Other | Attending: Family Medicine | Admitting: Family Medicine

## 2019-06-04 ENCOUNTER — Inpatient Hospital Stay: Payer: Medicare Other

## 2019-06-04 DIAGNOSIS — I5031 Acute diastolic (congestive) heart failure: Secondary | ICD-10-CM | POA: Diagnosis not present

## 2019-06-04 LAB — CBC WITH DIFFERENTIAL/PLATELET
Abs Immature Granulocytes: 0.02 10*3/uL (ref 0.00–0.07)
Basophils Absolute: 0.1 10*3/uL (ref 0.0–0.1)
Basophils Relative: 1 %
Eosinophils Absolute: 0.7 10*3/uL — ABNORMAL HIGH (ref 0.0–0.5)
Eosinophils Relative: 9 %
HCT: 44.2 % (ref 39.0–52.0)
Hemoglobin: 14.6 g/dL (ref 13.0–17.0)
Immature Granulocytes: 0 %
Lymphocytes Relative: 17 %
Lymphs Abs: 1.4 10*3/uL (ref 0.7–4.0)
MCH: 29.4 pg (ref 26.0–34.0)
MCHC: 33 g/dL (ref 30.0–36.0)
MCV: 88.9 fL (ref 80.0–100.0)
Monocytes Absolute: 0.6 10*3/uL (ref 0.1–1.0)
Monocytes Relative: 7 %
Neutro Abs: 5.4 10*3/uL (ref 1.7–7.7)
Neutrophils Relative %: 66 %
Platelets: 208 10*3/uL (ref 150–400)
RBC: 4.97 MIL/uL (ref 4.22–5.81)
RDW: 15.1 % (ref 11.5–15.5)
WBC: 8.2 10*3/uL (ref 4.0–10.5)
nRBC: 0 % (ref 0.0–0.2)

## 2019-06-04 LAB — BASIC METABOLIC PANEL
Anion gap: 8 (ref 5–15)
BUN: 43 mg/dL — ABNORMAL HIGH (ref 8–23)
CO2: 26 mmol/L (ref 22–32)
Calcium: 9.1 mg/dL (ref 8.9–10.3)
Chloride: 103 mmol/L (ref 98–111)
Creatinine, Ser: 1.32 mg/dL — ABNORMAL HIGH (ref 0.61–1.24)
GFR calc Af Amer: 55 mL/min — ABNORMAL LOW (ref >60)
GFR calc non Af Amer: 47 mL/min — ABNORMAL LOW (ref >60)
Glucose, Bld: 106 mg/dL — ABNORMAL HIGH (ref 70–99)
Potassium: 5.8 mmol/L — ABNORMAL HIGH (ref 3.5–5.1)
Sodium: 137 mmol/L (ref 135–145)

## 2019-06-04 LAB — GLUCOSE, CAPILLARY
Glucose-Capillary: 104 mg/dL — ABNORMAL HIGH (ref 70–99)
Glucose-Capillary: 191 mg/dL — ABNORMAL HIGH (ref 70–99)
Glucose-Capillary: 205 mg/dL — ABNORMAL HIGH (ref 70–99)
Glucose-Capillary: 242 mg/dL — ABNORMAL HIGH (ref 70–99)
Glucose-Capillary: 92 mg/dL (ref 70–99)
Glucose-Capillary: 98 mg/dL (ref 70–99)

## 2019-06-04 LAB — HEMOGLOBIN A1C
Hgb A1c MFr Bld: 8.5 % — ABNORMAL HIGH (ref 4.8–5.6)
Mean Plasma Glucose: 197 mg/dL

## 2019-06-04 LAB — ECHOCARDIOGRAM COMPLETE
Height: 70 in
Weight: 3104 oz

## 2019-06-04 MED ORDER — ACETYLCYSTEINE 10 % IN SOLN
2.0000 mL | Freq: Two times a day (BID) | RESPIRATORY_TRACT | Status: DC
  Filled 2019-06-04 (×2): qty 4

## 2019-06-04 MED ORDER — ALBUTEROL SULFATE (2.5 MG/3ML) 0.083% IN NEBU
2.5000 mg | INHALATION_SOLUTION | Freq: Four times a day (QID) | RESPIRATORY_TRACT | Status: DC | PRN
  Administered 2019-06-04: 2.5 mg via RESPIRATORY_TRACT
  Filled 2019-06-04: qty 3

## 2019-06-04 MED ORDER — SODIUM ZIRCONIUM CYCLOSILICATE 5 G PO PACK
5.0000 g | PACK | Freq: Once | ORAL | Status: AC
  Administered 2019-06-04: 5 g via ORAL
  Filled 2019-06-04: qty 1

## 2019-06-04 MED ORDER — ACETYLCYSTEINE 20 % IN SOLN
1.0000 mL | Freq: Two times a day (BID) | RESPIRATORY_TRACT | Status: DC
  Filled 2019-06-04 (×3): qty 4

## 2019-06-04 NOTE — Progress Notes (Signed)
[facility] Encounter Note  Patient: Joseph Flowers / Admit Date: 06/02/2019 / Date of Encounter: 06/04/2019, 2:27 PM   Subjective: Patient is slightly improved from last night.  Slight improvements in lower extremity edema and PND orthopnea. Echocardiogram showing severe LV systolic dysfunction with ejection fraction of 20% with moderate aortic stenosis moderate to severe mitral regurgitation appears to be significantly changed from before and likely the primary cause of current admission  Review of Systems: Positive for: Shortness of breath Negative for: Vision change, hearing change, syncope, dizziness, nausea, vomiting,diarrhea, bloody stool, stomach pain, cough, congestion, diaphoresis, urinary frequency, urinary pain,skin lesions, skin rashes Others previously listed  Objective: Telemetry: Atrial flutter with bundle branch block Physical Exam: Blood pressure 106/72, pulse 84, temperature 98.3 F (36.8 C), temperature source Oral, resp. rate 18, height 5\' 10"  (1.778 m), weight 88 kg, SpO2 100 %. Body mass index is 27.84 kg/m. General: Well developed, well nourished, in no acute distress. Head: Normocephalic, atraumatic, sclera non-icteric, no xanthomas, nares are without discharge. Neck: No apparent masses Lungs: Normal respirations with few to wheezes, no rhonchi, Xolair basilar rales , no crackles   Heart: Regular rate and rhythm, normal S1 S2, no 3+ aortic murmur, no rub, no gallop, PMI is normal size and placement, carotid upstroke normal without bruit, jugular venous pressure normal Abdomen: Soft, non-tender, non-distended with normoactive bowel sounds. No hepatosplenomegaly. Abdominal aorta is normal size without bruit Extremities: 2+ edema, no clubbing, no cyanosis, positive ulcers,  Peripheral: 2+ radial, 2+ femoral, 2+ dorsal pedal pulses Neuro: Alert and oriented. Moves all extremities spontaneously. Psych:  Responds to questions appropriately with a  normal affect.   Intake/Output Summary (Last 24 hours) at 06/04/2019 1427 Last data filed at 06/04/2019 1138 Gross per 24 hour  Intake 240 ml  Output 1950 ml  Net -1710 ml    Inpatient Medications:  . acetylcysteine  1 mL Nebulization BID  . apixaban  5 mg Oral BID  . aspirin EC  81 mg Oral BID  . collagenase   Topical QODAY  . feeding supplement (NEPRO CARB STEADY)  237 mL Oral BID BM  . finasteride  5 mg Oral Daily  . furosemide  80 mg Intravenous BID  . insulin aspart  0-9 Units Subcutaneous TID PC & HS  . metoprolol succinate  50 mg Oral Daily  . simvastatin  20 mg Oral QHS  . sodium chloride flush  3 mL Intravenous Q12H   Infusions:  . sodium chloride      Labs: Recent Labs    06/02/19 2124 06/03/19 0336 06/04/19 0441  NA 138  --  137  K 5.6*  --  5.8*  CL 104  --  103  CO2 24  --  26  GLUCOSE 179*  --  106*  BUN 38*  --  43*  CREATININE 1.21  --  1.32*  CALCIUM 9.5  --  9.1  MG  --  2.2  --    Recent Labs    06/02/19 2124  AST 37  ALT 36  ALKPHOS 153*  BILITOT 1.2  PROT 6.5  ALBUMIN 3.1*   Recent Labs    06/03/19 0336 06/04/19 0441  WBC 7.9 8.2  NEUTROABS 6.0 5.4  HGB 15.1 14.6  HCT 45.6 44.2  MCV 88.4 88.9  PLT 224 208   No results for input(s): CKTOTAL, CKMB, TROPONINI in the last 72 hours. Invalid input(s): POCBNP Recent Labs    06/03/19 0134  HGBA1C 8.5*  Weights: Filed Weights   06/02/19 2039 06/03/19 1216 06/04/19 0422  Weight: 83.5 kg 88.9 kg 88 kg     Radiology/Studies:  DG Chest 2 View  Result Date: 06/02/2019 CLINICAL DATA:  86 year old male with shortness of breath EXAM: CHEST - 2 VIEW COMPARISON:  Chest radiograph dated 07/18/2015. FINDINGS: There is mild cardiomegaly with vascular congestion and probable trace bilateral pleural effusions. Pneumonia is not excluded. Clinical correlation is recommended. Median sternotomy wires and CABG vascular clips noted. Atherosclerotic calcification of the aortic arch. Osteopenia  with degenerative changes of the spine. No acute osseous pathology. IMPRESSION: Mild cardiomegaly with findings of CHF. Pneumonia is not excluded. Electronically Signed   By: Anner Crete M.D.   On: 06/02/2019 21:03   US Venous Img Lower Bilateral  Result Date: 06/02/2019 CLINICAL DATA:  Initial evaluation for acute lower extremities swelling for 3 weeks EXAM: BILATERAL LOWER EXTREMITY VENOUS DOPPLER ULTRASOUND TECHNIQUE: Gray-scale sonography with graded compression, as well as color Doppler and duplex ultrasound were performed to evaluate the lower extremity deep venous systems from the level of the common femoral vein and including the common femoral, femoral, profunda femoral, popliteal and calf veins including the posterior tibial, peroneal and gastrocnemius veins when visible. The superficial great saphenous vein was also interrogated. Spectral Doppler was utilized to evaluate flow at rest and with distal augmentation maneuvers in the common femoral, femoral and popliteal veins. COMPARISON:  None. FINDINGS: RIGHT LOWER EXTREMITY Common Femoral Vein: No evidence of thrombus. Normal compressibility, respiratory phasicity and response to augmentation. Saphenofemoral Junction: No evidence of thrombus. Normal compressibility and flow on color Doppler imaging. Profunda Femoral Vein: No evidence of thrombus. Normal compressibility and flow on color Doppler imaging. Femoral Vein: No evidence of thrombus. Normal compressibility, respiratory phasicity and response to augmentation. Popliteal Vein: No evidence of thrombus. Normal compressibility, respiratory phasicity and response to augmentation. Calf Veins: Veins of the calf are not well visualized. No obvious thrombus or other abnormality. Superficial Great Saphenous Vein: No evidence of thrombus. Normal compressibility. Venous Reflux:  None. Other Findings: Diffuse soft tissue/interstitial edema seen throughout the subcutaneous soft tissues of the lower  leg/calf. LEFT LOWER EXTREMITY Common Femoral Vein: No evidence of thrombus. Normal compressibility, respiratory phasicity and response to augmentation. Saphenofemoral Junction: No evidence of thrombus. Normal compressibility and flow on color Doppler imaging. Profunda Femoral Vein: No evidence of thrombus. Normal compressibility and flow on color Doppler imaging. Femoral Vein: No evidence of thrombus. Normal compressibility, respiratory phasicity and response to augmentation. Popliteal Vein: No evidence of thrombus. Normal compressibility, respiratory phasicity and response to augmentation. Calf Veins: Veins of the calf are not well visualized. No obvious thrombus or other abnormality. Superficial Great Saphenous Vein: No evidence of thrombus. Normal compressibility. Venous Reflux:  None. Other Findings: Diffuse soft tissue/interstitial edema seen throughout the subcutaneous soft tissues of the lower leg/calf. IMPRESSION: 1. No evidence of deep venous thrombosis in either lower extremity. 2. Diffuse soft tissue/interstitial edema within the lower legs/calves bilaterally. Electronically Signed   By: Jeannine Boga M.D.   On: 06/02/2019 22:44   ECHOCARDIOGRAM COMPLETE  Result Date: 06/04/2019    ECHOCARDIOGRAM REPORT   Patient Name:   LARENZO CITTADINO Date of Exam: 06/04/2019 Medical Rec #:  [id number]       Height:       70.0 in Accession #:    [id number]      Weight:       194.0 lb Date of Birth:  08/08/1927  BSA:          2.060 m Patient Age:    26 years        BP:           109/73 mmHg Patient Gender: M               HR:           94 bpm. Exam Location:  [facility] Procedure: 2D Echo Indications:     CHF-ACUTE DIASTOLIC 0000000  History:         Patient has prior history of Echocardiogram examinations, most                  recent 07/18/2015. CHF, CAD, Prior CABG,                  Signs/Symptoms:Shortness of Breath; Risk Factors:Diabetes.  Sonographer:     Avanell Shackleton Referring Phys:  [id number] [place] Diagnosing Phys: Serafina Royals MD  Sonographer Comments: I ATTEMPTED PEDOFF IN ALL VIEWS IMPRESSIONS  1. Left ventricular ejection fraction, by estimation, is <20%. The left ventricle has severely decreased function. The left ventricle demonstrates global hypokinesis. The left ventricular internal cavity size was moderately dilated. There is mild left ventricular hypertrophy. Left ventricular diastolic parameters were normal.  2. Right ventricular systolic function is normal. The right ventricular size is normal. There is moderately elevated pulmonary artery systolic pressure.  3. Left atrial size was moderately dilated.  4. Right atrial size was mildly dilated.  5. Large pleural effusion in the left lateral region.  6. The mitral valve is normal in structure. Moderate to severe mitral valve regurgitation.  7. Tricuspid valve regurgitation is moderate to severe.  8. The aortic valve is normal in structure. Aortic valve regurgitation is trivial. Moderate to severe aortic valve stenosis. FINDINGS  Left Ventricle: Left ventricular ejection fraction, by estimation, is <20%. The left ventricle has severely decreased function. The left ventricle demonstrates global hypokinesis. The left ventricular internal cavity size was moderately dilated. There is mild left ventricular hypertrophy. Left ventricular diastolic parameters were normal. Right Ventricle: The right ventricular size is normal. No increase in right ventricular wall thickness. Right ventricular systolic function is normal. There is moderately elevated pulmonary artery systolic pressure. The tricuspid regurgitant velocity is 3.68 m/s, and with an assumed right atrial pressure of 10 mmHg, the estimated right ventricular systolic pressure is [id number]. Left Atrium: Left atrial size was moderately dilated. Right Atrium: Right atrial size was mildly dilated. Pericardium: There is no evidence of pericardial effusion. Mitral Valve: The mitral valve is normal in  structure. Moderate to severe mitral valve regurgitation. Tricuspid Valve: The tricuspid valve is normal in structure. Tricuspid valve regurgitation is moderate to severe. Aortic Valve: The aortic valve is normal in structure. Aortic valve regurgitation is trivial. Moderate to severe aortic stenosis is present. Aortic valve mean gradient measures 43.3 mmHg. Aortic valve peak gradient measures 64.1 mmHg. Aortic valve area, by VTI measures 0.36 cm. Pulmonic Valve: The pulmonic valve was normal in structure. Pulmonic valve regurgitation is mild. Aorta: The aortic root and ascending aorta are structurally normal, with no evidence of dilitation. IAS/Shunts: No atrial level shunt detected by color flow Doppler. Additional Comments: There is a large pleural effusion in the left lateral region.  LEFT VENTRICLE PLAX 2D LVIDd:         4.92 cm LVIDs:         4.28 cm LV PW:  0.94 cm LV IVS:        1.04 cm LVOT diam:     1.80 cm LV SV:         31 LV SV Index:   15 LVOT Area:     2.54 cm  LV Volumes (MOD) LV vol d, MOD A2C: 171.0 ml LV vol d, MOD A4C: 132.0 ml LV vol s, MOD A2C: 133.0 ml LV vol s, MOD A4C: 91.3 ml LV SV MOD A2C:     38.0 ml LV SV MOD A4C:     132.0 ml LV SV MOD BP:      38.2 ml IVC IVC diam: 2.53 cm LEFT ATRIUM             Index LA diam:        4.30 cm 2.09 cm/m LA Vol (A2C):   81.5 ml 39.55 ml/m LA Vol (A4C):   63.4 ml 30.77 ml/m LA Biplane Vol: 73.5 ml 35.67 ml/m  AORTIC VALVE AV Area (Vmax):    0.37 cm AV Area (Vmean):   0.33 cm AV Area (VTI):     0.36 cm AV Vmax:           400.33 cm/s AV Vmean:          314.667 cm/s AV VTI:            0.837 m AV Peak Grad:      64.1 mmHg AV Mean Grad:      43.3 mmHg LVOT Vmax:         58.70 cm/s LVOT Vmean:        40.500 cm/s LVOT VTI:          0.120 m LVOT/AV VTI ratio: 0.14  AORTA Ao Root diam: 3.60 cm MR Peak grad: 144.2 mmHg  TRICUSPID VALVE MR Mean grad: 85.7 mmHg   TR Peak grad:   54.2 mmHg MR Vmax:      600.33 cm/s TR Vmax:        368.00 cm/s MR Vmean:      430.3 cm/s                           SHUNTS                           Systemic VTI:  0.12 m                           Systemic Diam: 1.80 cm Serafina Royals MD Electronically signed by Serafina Royals MD Signature Date/Time: 06/04/2019/12:33:36 PM    Final      Assessment and Recommendation  86 y.o. male with acute on chronic systolic dysfunction congestive heart failure with elevated BNP and known coronary artery disease status post coronary bypass graft hypertension hyperlipidemia diabetes with elevated troponin consistent with demand ischemia rather than acute coronary syndrome now slightly improved 1.  Continue intravenous Lasix for significant acute on chronic systolic dysfunction congestive heart failure 2.  No further cardiac diagnostic necessary due to no evidence of myocardial infarction and no apparent ability for aortic valve stenosis treatment surgically 3.  Continue metoprolol for heart rate control of atrial flutter with a goal heart rate between 60 and 80 bpm 4.  Anticoagulation for further risk reduction in stroke with atrial fibrillation with Eliquis unchanged 5.  Begin ambulation and follow-up for improvements of symptoms and possible adjustments of  medications as able after above  Signed, Serafina Royals M.D. FACC

## 2019-06-04 NOTE — Significant Event (Addendum)
Rapid Response Event Note  Overview: Time Called: 1132 Arrival Time: [id number] Event Type: Respiratory  Initial Focused Assessment: called RR for pt seeming to be aspirating on his secretions. Pt laying in bed, able to speak, able to cough, VSS.   Interventions: Dr Loleta Books at bedside, ordered CXR,  neb treatments and PT therapy from Respiratory therapist.   Plan of Care (if not transferred): RN Ashly to call for further assistance.  Event Summary: Name of Physician Notified: Dr Loleta Books at 1132    at    Outcome: Stayed in room and stabalized  Event End Time: [place]

## 2019-06-04 NOTE — Progress Notes (Signed)
PROGRESS NOTE    Joseph Flowers  [id number] DOB: November 24, 1927 DOA: 06/02/2019 PCP: Joseph Hire, MD      Brief Narrative:  Joseph Flowers is a 86 y.o. M with permanent A. fib status post atrial ablation on Eliquis, dCHF, DM, CAD s/p CABG remote, and HTN who presented with several days worsening dyspnea as well as severe lower extremity edema.  He noted his home oxygen down to 84% on ambulation.  In the ER he 04/27/1990, at bedtime troponin elevated slightly but stable.  Creatinine 1.2, potassium 5.6.  Bilateral lower extremity duplex negative for DVT, but there is severe swelling and redness of bilateral calves.  EKG showed atrial flutter with normal rate.  Patient was started on IV Lasix and admitted to the hospitalist service.            Assessment & Plan:  Acute on chronic diastolic CHF Admitted and started on IV Lasix.    1.5L out yesterday, K still elevated.  Cr stable.  Echo from yesterday shows newly severely decreased EF <20%, effusion mitral regurgitation and aortic stenosis.   -Continue Furosemide 80 mg IV twice a day   -Strict I/Os, daily weights, telemetry  -Daily monitoring renal function -Consult Cardiology -Consult palliative care    Permanent atrial fibrillation status post ablation -Continue Eliquis -Continue metoprolol  Diabetes Glucose reasonably well controlled, 92 this AM. -Continue sliding scale corrections -Hold home Metformin and glimepiride  Coronary disease, secondary prevention Hypertension -Hold home ramipril -Continue home simvastatin  Cellulitis, ruled out  Hyperkalemia -Continue IV Lasix -Lokelma once  BPH -Continue finasteride       Disposition: Status is: Inpatient  Remains inpatient appropriate because:Inpatient level of care appropriate due to severity of illness   Dispo: The patient is from: Home              Anticipated d/c is to: SNF              Anticipated d/c date is: > 3 days              Patient  currently is not medically stable to d/c.                  MDM: The below labs and imaging reports reviewed and summarized above.  Medication management as above. This is a severe exacerbation of his chronic disease.     DVT prophylaxis: Not applicable, on Eliquis Code Status: Full code Family Communication: daughter at bedside    Consultants:   Cardiology  Procedures:   Echo -- severely reduced EF  Antimicrobials:      Culture data:              Subjective: Patient is still very weak, out of breath.  He is coughing frequently.  While I was in the room, he had an coughing episode in which he gagged on some spittle, and choked badly before he was able to catch his breath.     Objective: Vitals:   06/04/19 0422 06/04/19 0805 06/04/19 1144 06/04/19 1147  BP: 100/72 104/77 106/73 106/72  Pulse: 89 87 91 84  Resp: 20 17  18   Temp: 98.3 F (36.8 C) 98.4 F (36.9 C)  98.3 F (36.8 C)  TempSrc:    Oral  SpO2: 95% 99% 93% 100%  Weight: 88 kg     Height:        Intake/Output Summary (Last 24 hours) at 06/04/2019 1609 Last data filed at 06/04/2019 1434 Gross  per 24 hour  Intake 240 ml  Output 2075 ml  Net -1835 ml   Filed Weights   06/02/19 2039 06/03/19 1216 06/04/19 0422  Weight: 83.5 kg 88.9 kg 88 kg    Examination: General appearance: Frail elderly adult male, alert and in mild respiratory distress.  Coughing frequently HEENT: Anicteric, conjunctiva pink, lids and lashes normal. No nasal deformity, discharge, epistaxis.  Lips moist, OP tacky dry, no oral lesions.   Skin: Warm and dry.  No suspicious rashes or lesions.  Numerous senile purpura. Cardiac: RRR, systolic murmur, JVP elevated to the jaw, severe lower extremity edema Respiratory: Tachypneic, frequently coughing, rales bilaterally Abdomen: Abdomen soft.  No tenderness palpation or guarding. No ascites, distension, hepatosplenomegaly.   MSK: No deformities or effusions of the  large joints of the upper or lower extremities bilaterally.  Diffuse severe loss of subcutaneous muscle mass and fat Neuro: Awake and alert.  Muscle tone diminished, moves all extremities with severe generalized weakness, speech fluent.    Psych: Psychomotor slowing, attention diminished, affect blunted, judgment insight appear moderately impaired.      Data Reviewed: I have personally reviewed following labs and imaging studies:  CBC: Recent Labs  Lab 06/02/19 2124 06/03/19 0336 06/04/19 0441  WBC 8.0 7.9 8.2  NEUTROABS  --  6.0 5.4  HGB 14.7 15.1 14.6  HCT 45.1 45.6 44.2  MCV 89.7 88.4 88.9  PLT 224 224 [id number]   Basic Metabolic Panel: Recent Labs  Lab 06/02/19 2124 06/03/19 0336 06/04/19 0441  NA 138  --  137  K 5.6*  --  5.8*  CL 104  --  103  CO2 24  --  26  GLUCOSE 179*  --  106*  BUN 38*  --  43*  CREATININE 1.21  --  1.32*  CALCIUM 9.5  --  9.1  MG  --  2.2  --    GFR: Estimated Creatinine Clearance: 42.4 mL/min (A) (by C-G formula based on SCr of 1.32 mg/dL (H)). Liver Function Tests: Recent Labs  Lab 06/02/19 2124  AST 37  ALT 36  ALKPHOS 153*  BILITOT 1.2  PROT 6.5  ALBUMIN 3.1*   No results for input(s): LIPASE, AMYLASE in the last 168 hours. No results for input(s): AMMONIA in the last 168 hours. Coagulation Profile: No results for input(s): INR, PROTIME in the last 168 hours. Cardiac Enzymes: No results for input(s): CKTOTAL, CKMB, CKMBINDEX, TROPONINI in the last 168 hours. BNP (last 3 results) No results for input(s): PROBNP in the last 8760 hours. HbA1C: Recent Labs    06/03/19 0134  HGBA1C 8.5*   CBG: Recent Labs  Lab 06/04/19 0018 06/04/19 0419 06/04/19 0807 06/04/19 1215 06/04/19 1559  GLUCAP 98 104* 92 191* 205*   Lipid Profile: No results for input(s): CHOL, HDL, LDLCALC, TRIG, CHOLHDL, LDLDIRECT in the last 72 hours. Thyroid Function Tests: Recent Labs    06/03/19 0336  TSH 7.684*   Anemia Panel: No results for  input(s): VITAMINB12, FOLATE, FERRITIN, TIBC, IRON, RETICCTPCT in the last 72 hours. Urine analysis:    Component Value Date/Time   COLORURINE YELLOW (A) 07/14/2015 1035   APPEARANCEUR CLEAR (A) 07/14/2015 1035   LABSPEC 1.026 07/14/2015 1035   PHURINE 5.0 07/14/2015 1035   GLUCOSEU >500 (A) 07/14/2015 1035   HGBUR 1+ (A) 07/14/2015 1035   BILIRUBINUR NEGATIVE 07/14/2015 1035   KETONESUR 1+ (A) 07/14/2015 1035   PROTEINUR 30 (A) 07/14/2015 1035   NITRITE NEGATIVE 07/14/2015 1035   LEUKOCYTESUR  NEGATIVE 07/14/2015 1035   Sepsis Labs: @LABRCNTIP (procalcitonin:4,lacticacidven:4)  ) Recent Results (from the past 240 hour(s))  Respiratory Panel by RT PCR (Flu A&B, Covid) - Nasopharyngeal Swab     Status: None   Collection Time: 06/03/19  1:34 AM   Specimen: Nasopharyngeal Swab  Result Value Ref Range Status   SARS Coronavirus 2 by RT PCR NEGATIVE NEGATIVE Final    Comment: (NOTE) SARS-CoV-2 target nucleic acids are NOT DETECTED. The SARS-CoV-2 RNA is generally detectable in upper respiratoy specimens during the acute phase of infection. The lowest concentration of SARS-CoV-2 viral copies this assay can detect is 131 copies/mL. A negative result does not preclude SARS-Cov-2 infection and should not be used as the sole basis for treatment or other patient management decisions. A negative result may occur with  improper specimen collection/handling, submission of specimen other than nasopharyngeal swab, presence of viral mutation(s) within the areas targeted by this assay, and inadequate number of viral copies (<131 copies/mL). A negative result must be combined with clinical observations, patient history, and epidemiological information. The expected result is Negative. Fact Sheet for Patients:  PinkCheek.be Fact Sheet for Healthcare Providers:  GravelBags.it This test is not yet ap proved or cleared by the [place]  FDA and  has been authorized for detection and/or diagnosis of SARS-CoV-2 by FDA under an Emergency Use Authorization (EUA). This EUA will remain  in effect (meaning this test can be used) for the duration of the COVID-19 declaration under Section 564(b)(1) of the Act, 21 U.S.C. section 360bbb-3(b)(1), unless the authorization is terminated or revoked sooner.    Influenza A by PCR NEGATIVE NEGATIVE Final   Influenza B by PCR NEGATIVE NEGATIVE Final    Comment: (NOTE) The Xpert Xpress SARS-CoV-2/FLU/RSV assay is intended as an aid in  the diagnosis of influenza from Nasopharyngeal swab specimens and  should not be used as a sole basis for treatment. Nasal washings and  aspirates are unacceptable for Xpert Xpress SARS-CoV-2/FLU/RSV  testing. Fact Sheet for Patients: PinkCheek.be Fact Sheet for Healthcare Providers: GravelBags.it This test is not yet approved or cleared by the [place] FDA and  has been authorized for detection and/or diagnosis of SARS-CoV-2 by  FDA under an Emergency Use Authorization (EUA). This EUA will remain  in effect (meaning this test can be used) for the duration of the  Covid-19 declaration under Section 564(b)(1) of the Act, 21  U.S.C. section 360bbb-3(b)(1), unless the authorization is  terminated or revoked. Performed at [facility], [address] [place], [place] [address]          Radiology Studies: DG Chest 2 View  Result Date: 06/02/2019 CLINICAL DATA:  86 year old male with shortness of breath EXAM: CHEST - 2 VIEW COMPARISON:  Chest radiograph dated 07/18/2015. FINDINGS: There is mild cardiomegaly with vascular congestion and probable trace bilateral pleural effusions. Pneumonia is not excluded. Clinical correlation is recommended. Median sternotomy wires and CABG vascular clips noted. Atherosclerotic calcification of the aortic arch. Osteopenia with degenerative changes of  the spine. No acute osseous pathology. IMPRESSION: Mild cardiomegaly with findings of CHF. Pneumonia is not excluded. Electronically Signed   By: Anner Crete M.D.   On: 06/02/2019 21:03   US Venous Img Lower Bilateral  Result Date: 06/02/2019 CLINICAL DATA:  Initial evaluation for acute lower extremities swelling for 3 weeks EXAM: BILATERAL LOWER EXTREMITY VENOUS DOPPLER ULTRASOUND TECHNIQUE: Gray-scale sonography with graded compression, as well as color Doppler and duplex ultrasound were performed to evaluate the lower extremity  deep venous systems from the level of the common femoral vein and including the common femoral, femoral, profunda femoral, popliteal and calf veins including the posterior tibial, peroneal and gastrocnemius veins when visible. The superficial great saphenous vein was also interrogated. Spectral Doppler was utilized to evaluate flow at rest and with distal augmentation maneuvers in the common femoral, femoral and popliteal veins. COMPARISON:  None. FINDINGS: RIGHT LOWER EXTREMITY Common Femoral Vein: No evidence of thrombus. Normal compressibility, respiratory phasicity and response to augmentation. Saphenofemoral Junction: No evidence of thrombus. Normal compressibility and flow on color Doppler imaging. Profunda Femoral Vein: No evidence of thrombus. Normal compressibility and flow on color Doppler imaging. Femoral Vein: No evidence of thrombus. Normal compressibility, respiratory phasicity and response to augmentation. Popliteal Vein: No evidence of thrombus. Normal compressibility, respiratory phasicity and response to augmentation. Calf Veins: Veins of the calf are not well visualized. No obvious thrombus or other abnormality. Superficial Great Saphenous Vein: No evidence of thrombus. Normal compressibility. Venous Reflux:  None. Other Findings: Diffuse soft tissue/interstitial edema seen throughout the subcutaneous soft tissues of the lower leg/calf. LEFT LOWER EXTREMITY  Common Femoral Vein: No evidence of thrombus. Normal compressibility, respiratory phasicity and response to augmentation. Saphenofemoral Junction: No evidence of thrombus. Normal compressibility and flow on color Doppler imaging. Profunda Femoral Vein: No evidence of thrombus. Normal compressibility and flow on color Doppler imaging. Femoral Vein: No evidence of thrombus. Normal compressibility, respiratory phasicity and response to augmentation. Popliteal Vein: No evidence of thrombus. Normal compressibility, respiratory phasicity and response to augmentation. Calf Veins: Veins of the calf are not well visualized. No obvious thrombus or other abnormality. Superficial Great Saphenous Vein: No evidence of thrombus. Normal compressibility. Venous Reflux:  None. Other Findings: Diffuse soft tissue/interstitial edema seen throughout the subcutaneous soft tissues of the lower leg/calf. IMPRESSION: 1. No evidence of deep venous thrombosis in either lower extremity. 2. Diffuse soft tissue/interstitial edema within the lower legs/calves bilaterally. Electronically Signed   By: Jeannine Boga M.D.   On: 06/02/2019 22:44   DG Chest Port 1 View  Result Date: 06/04/2019 CLINICAL DATA:  Aspiration EXAM: PORTABLE CHEST 1 VIEW COMPARISON:  06/02/2019 FINDINGS: Post CABG changes. Stable heart size. Atherosclerotic calcification of the aortic knob. Low lung volumes. Pulmonary vascular congestion with interstitial prominence, similar to prior. Probable small bilateral pleural effusions. No pneumothorax. IMPRESSION: Unchanged appearance of the chest with suggestion of CHF with mild edema. Electronically Signed   By: Davina Poke D.O.   On: 06/04/2019 15:23   ECHOCARDIOGRAM COMPLETE  Result Date: 06/04/2019    ECHOCARDIOGRAM REPORT   Patient Name:   Joseph Flowers Date of Exam: 06/04/2019 Medical Rec #:  [id number]       Height:       70.0 in Accession #:    [id number]      Weight:       194.0 lb Date of Birth:   03/14/1927       BSA:          2.060 m Patient Age:    76 years        BP:           109/73 mmHg Patient Gender: M               HR:           94 bpm. Exam Location:  [facility] Procedure: 2D Echo Indications:     CHF-ACUTE DIASTOLIC [id number]  History:  Patient has prior history of Echocardiogram examinations, most                  recent 07/18/2015. CHF, CAD, Prior CABG,                  Signs/Symptoms:Shortness of Breath; Risk Factors:Diabetes.  Sonographer:     Avanell Shackleton Referring Phys:  [id number] [place] Diagnosing Phys: Serafina Royals MD  Sonographer Comments: I ATTEMPTED PEDOFF IN ALL VIEWS IMPRESSIONS  1. Left ventricular ejection fraction, by estimation, is <20%. The left ventricle has severely decreased function. The left ventricle demonstrates global hypokinesis. The left ventricular internal cavity size was moderately dilated. There is mild left ventricular hypertrophy. Left ventricular diastolic parameters were normal.  2. Right ventricular systolic function is normal. The right ventricular size is normal. There is moderately elevated pulmonary artery systolic pressure.  3. Left atrial size was moderately dilated.  4. Right atrial size was mildly dilated.  5. Large pleural effusion in the left lateral region.  6. The mitral valve is normal in structure. Moderate to severe mitral valve regurgitation.  7. Tricuspid valve regurgitation is moderate to severe.  8. The aortic valve is normal in structure. Aortic valve regurgitation is trivial. Moderate to severe aortic valve stenosis. FINDINGS  Left Ventricle: Left ventricular ejection fraction, by estimation, is <20%. The left ventricle has severely decreased function. The left ventricle demonstrates global hypokinesis. The left ventricular internal cavity size was moderately dilated. There is mild left ventricular hypertrophy. Left ventricular diastolic parameters were normal. Right Ventricle: The right ventricular size is normal. No increase in  right ventricular wall thickness. Right ventricular systolic function is normal. There is moderately elevated pulmonary artery systolic pressure. The tricuspid regurgitant velocity is 3.68 m/s, and with an assumed right atrial pressure of 10 mmHg, the estimated right ventricular systolic pressure is [id number]. Left Atrium: Left atrial size was moderately dilated. Right Atrium: Right atrial size was mildly dilated. Pericardium: There is no evidence of pericardial effusion. Mitral Valve: The mitral valve is normal in structure. Moderate to severe mitral valve regurgitation. Tricuspid Valve: The tricuspid valve is normal in structure. Tricuspid valve regurgitation is moderate to severe. Aortic Valve: The aortic valve is normal in structure. Aortic valve regurgitation is trivial. Moderate to severe aortic stenosis is present. Aortic valve mean gradient measures 43.3 mmHg. Aortic valve peak gradient measures 64.1 mmHg. Aortic valve area, by VTI measures 0.36 cm. Pulmonic Valve: The pulmonic valve was normal in structure. Pulmonic valve regurgitation is mild. Aorta: The aortic root and ascending aorta are structurally normal, with no evidence of dilitation. IAS/Shunts: No atrial level shunt detected by color flow Doppler. Additional Comments: There is a large pleural effusion in the left lateral region.  LEFT VENTRICLE PLAX 2D LVIDd:         4.92 cm LVIDs:         4.28 cm LV PW:         0.94 cm LV IVS:        1.04 cm LVOT diam:     1.80 cm LV SV:         31 LV SV Index:   15 LVOT Area:     2.54 cm  LV Volumes (MOD) LV vol d, MOD A2C: 171.0 ml LV vol d, MOD A4C: 132.0 ml LV vol s, MOD A2C: 133.0 ml LV vol s, MOD A4C: 91.3 ml LV SV MOD A2C:     38.0 ml LV SV MOD A4C:  132.0 ml LV SV MOD BP:      38.2 ml IVC IVC diam: 2.53 cm LEFT ATRIUM             Index LA diam:        4.30 cm 2.09 cm/m LA Vol (A2C):   81.5 ml 39.55 ml/m LA Vol (A4C):   63.4 ml 30.77 ml/m LA Biplane Vol: 73.5 ml 35.67 ml/m  AORTIC VALVE AV Area  (Vmax):    0.37 cm AV Area (Vmean):   0.33 cm AV Area (VTI):     0.36 cm AV Vmax:           400.33 cm/s AV Vmean:          314.667 cm/s AV VTI:            0.837 m AV Peak Grad:      64.1 mmHg AV Mean Grad:      43.3 mmHg LVOT Vmax:         58.70 cm/s LVOT Vmean:        40.500 cm/s LVOT VTI:          0.120 m LVOT/AV VTI ratio: 0.14  AORTA Ao Root diam: 3.60 cm MR Peak grad: 144.2 mmHg  TRICUSPID VALVE MR Mean grad: 85.7 mmHg   TR Peak grad:   54.2 mmHg MR Vmax:      600.33 cm/s TR Vmax:        368.00 cm/s MR Vmean:     430.3 cm/s                           SHUNTS                           Systemic VTI:  0.12 m                           Systemic Diam: 1.80 cm Serafina Royals MD Electronically signed by Serafina Royals MD Signature Date/Time: 06/04/2019/12:33:36 PM    Final         Scheduled Meds: . acetylcysteine  1 mL Nebulization BID  . apixaban  5 mg Oral BID  . aspirin EC  81 mg Oral BID  . collagenase   Topical QODAY  . feeding supplement (NEPRO CARB STEADY)  237 mL Oral BID BM  . finasteride  5 mg Oral Daily  . furosemide  80 mg Intravenous BID  . insulin aspart  0-9 Units Subcutaneous TID PC & HS  . metoprolol succinate  50 mg Oral Daily  . simvastatin  20 mg Oral QHS  . sodium chloride flush  3 mL Intravenous Q12H   Continuous Infusions: . sodium chloride       LOS: 1 day    Time spent: 35 minutes     Edwin Dada, MD [facility] 06/04/2019, 4:09 PM     Please page though [place] or Epic secure chat:  For [employer], Adult nurse

## 2019-06-05 DIAGNOSIS — I5031 Acute diastolic (congestive) heart failure: Secondary | ICD-10-CM | POA: Diagnosis not present

## 2019-06-05 LAB — BASIC METABOLIC PANEL
Anion gap: 7 (ref 5–15)
BUN: 45 mg/dL — ABNORMAL HIGH (ref 8–23)
CO2: 28 mmol/L (ref 22–32)
Calcium: 8.6 mg/dL — ABNORMAL LOW (ref 8.9–10.3)
Chloride: 100 mmol/L (ref 98–111)
Creatinine, Ser: 1.36 mg/dL — ABNORMAL HIGH (ref 0.61–1.24)
GFR calc Af Amer: 53 mL/min — ABNORMAL LOW (ref >60)
GFR calc non Af Amer: 46 mL/min — ABNORMAL LOW (ref >60)
Glucose, Bld: 104 mg/dL — ABNORMAL HIGH (ref 70–99)
Potassium: 5.2 mmol/L — ABNORMAL HIGH (ref 3.5–5.1)
Sodium: 135 mmol/L (ref 135–145)

## 2019-06-05 LAB — CBC WITH DIFFERENTIAL/PLATELET
Abs Immature Granulocytes: 0.02 10*3/uL (ref 0.00–0.07)
Basophils Absolute: 0.1 10*3/uL (ref 0.0–0.1)
Basophils Relative: 1 %
Eosinophils Absolute: 0.9 10*3/uL — ABNORMAL HIGH (ref 0.0–0.5)
Eosinophils Relative: 11 %
HCT: 43.1 % (ref 39.0–52.0)
Hemoglobin: 13.9 g/dL (ref 13.0–17.0)
Immature Granulocytes: 0 %
Lymphocytes Relative: 17 %
Lymphs Abs: 1.4 10*3/uL (ref 0.7–4.0)
MCH: 29.4 pg (ref 26.0–34.0)
MCHC: 32.3 g/dL (ref 30.0–36.0)
MCV: 91.3 fL (ref 80.0–100.0)
Monocytes Absolute: 0.6 10*3/uL (ref 0.1–1.0)
Monocytes Relative: 8 %
Neutro Abs: 5.1 10*3/uL (ref 1.7–7.7)
Neutrophils Relative %: 63 %
Platelets: 229 10*3/uL (ref 150–400)
RBC: 4.72 MIL/uL (ref 4.22–5.81)
RDW: 14.6 % (ref 11.5–15.5)
WBC: 8.1 10*3/uL (ref 4.0–10.5)
nRBC: 0 % (ref 0.0–0.2)

## 2019-06-05 LAB — GLUCOSE, CAPILLARY
Glucose-Capillary: 104 mg/dL — ABNORMAL HIGH (ref 70–99)
Glucose-Capillary: 207 mg/dL — ABNORMAL HIGH (ref 70–99)
Glucose-Capillary: 170 mg/dL — ABNORMAL HIGH (ref 70–99)
Glucose-Capillary: 204 mg/dL — ABNORMAL HIGH (ref 70–99)

## 2019-06-05 MED ORDER — ORAL CARE MOUTH RINSE
15.0000 mL | Freq: Two times a day (BID) | OROMUCOSAL | Status: DC
  Administered 2019-06-05 – 2019-06-09 (×8): 15 mL via OROMUCOSAL

## 2019-06-05 NOTE — Progress Notes (Signed)
[facility] Encounter Note  Patient: Joseph Flowers / Admit Date: 06/02/2019 / Date of Encounter: 06/05/2019, 8:02 AM   Subjective: Patient is slightly improved from last night.  Slight improvements in lower extremity edema and PND orthopnea. Echocardiogram showing severe LV systolic dysfunction with ejection fraction of 20% with moderate aortic stenosis moderate to severe mitral regurgitation appears to be significantly changed from before and likely the primary cause of current admission Overall patient has relatively poor prognosis long-term with multiple issues above Review of Systems: Positive for: Shortness of breath edema Negative for: Vision change, hearing change, syncope, dizziness, nausea, vomiting,diarrhea, bloody stool, stomach pain, cough, congestion, diaphoresis, urinary frequency, urinary pain,skin lesions, skin rashes Others previously listed  Objective: Telemetry: Atrial flutter with bundle branch block Physical Exam: Blood pressure 99/79, pulse 78, temperature 97.6 F (36.4 C), temperature source Oral, resp. rate 20, height 5\' 10"  (1.778 m), weight 89.7 kg, SpO2 98 %. Body mass index is 28.38 kg/m. General: Well developed, well nourished, in no acute distress. Head: Normocephalic, atraumatic, sclera non-icteric, no xanthomas, nares are without discharge. Neck: No apparent masses Lungs: Normal respirations with few to wheezes, no rhonchi, Xolair basilar rales , no crackles   Heart: Regular rate and rhythm, normal S1 S2, no 3+ aortic murmur, no rub, no gallop, PMI is normal size and placement, carotid upstroke normal without bruit, jugular venous pressure normal Abdomen: Soft, non-tender, non-distended with normoactive bowel sounds. No hepatosplenomegaly. Abdominal aorta is normal size without bruit Extremities: 2+ edema, no clubbing, no cyanosis, positive ulcers,  Peripheral: 2+ radial, 2+ femoral, 2+ dorsal pedal pulses Neuro: Alert and oriented.  Moves all extremities spontaneously. Psych:  Responds to questions appropriately with a normal affect.   Intake/Output Summary (Last 24 hours) at 06/05/2019 0802 Last data filed at 06/05/2019 0431 Gross per 24 hour  Intake 480 ml  Output 1125 ml  Net -645 ml    Inpatient Medications:  . apixaban  5 mg Oral BID  . aspirin EC  81 mg Oral BID  . collagenase   Topical QODAY  . feeding supplement (NEPRO CARB STEADY)  237 mL Oral BID BM  . finasteride  5 mg Oral Daily  . furosemide  80 mg Intravenous BID  . insulin aspart  0-9 Units Subcutaneous TID PC & HS  . mouth rinse  15 mL Mouth Rinse BID  . metoprolol succinate  50 mg Oral Daily  . simvastatin  20 mg Oral QHS  . sodium chloride flush  3 mL Intravenous Q12H   Infusions:  . sodium chloride      Labs: Recent Labs    06/02/19 2124 06/03/19 0336 06/04/19 0441 06/05/19 0431  NA   < >  --  137 135  K   < >  --  5.8* 5.2*  CL   < >  --  103 100  CO2   < >  --  26 28  GLUCOSE   < >  --  106* 104*  BUN   < >  --  43* 45*  CREATININE   < >  --  1.32* 1.36*  CALCIUM   < >  --  9.1 8.6*  MG  --  2.2  --   --    < > = values in this interval not displayed.   Recent Labs    06/02/19 2124  AST 37  ALT 36  ALKPHOS 153*  BILITOT 1.2  PROT 6.5  ALBUMIN 3.1*  Recent Labs    06/04/19 0441 06/05/19 0431  WBC 8.2 8.1  NEUTROABS 5.4 5.1  HGB 14.6 13.9  HCT 44.2 43.1  MCV 88.9 91.3  PLT 208 229   No results for input(s): CKTOTAL, CKMB, TROPONINI in the last 72 hours. Invalid input(s): POCBNP Recent Labs    06/03/19 0134  HGBA1C 8.5*     Weights: Filed Weights   06/03/19 1216 06/04/19 0422 06/05/19 0426  Weight: 88.9 kg 88 kg 89.7 kg     Radiology/Studies:  DG Chest 2 View  Result Date: 06/02/2019 CLINICAL DATA:  86 year old male with shortness of breath EXAM: CHEST - 2 VIEW COMPARISON:  Chest radiograph dated 07/18/2015. FINDINGS: There is mild cardiomegaly with vascular congestion and probable trace  bilateral pleural effusions. Pneumonia is not excluded. Clinical correlation is recommended. Median sternotomy wires and CABG vascular clips noted. Atherosclerotic calcification of the aortic arch. Osteopenia with degenerative changes of the spine. No acute osseous pathology. IMPRESSION: Mild cardiomegaly with findings of CHF. Pneumonia is not excluded. Electronically Signed   By: Anner Crete M.D.   On: 06/02/2019 21:03   US Venous Img Lower Bilateral  Result Date: 06/02/2019 CLINICAL DATA:  Initial evaluation for acute lower extremities swelling for 3 weeks EXAM: BILATERAL LOWER EXTREMITY VENOUS DOPPLER ULTRASOUND TECHNIQUE: Gray-scale sonography with graded compression, as well as color Doppler and duplex ultrasound were performed to evaluate the lower extremity deep venous systems from the level of the common femoral vein and including the common femoral, femoral, profunda femoral, popliteal and calf veins including the posterior tibial, peroneal and gastrocnemius veins when visible. The superficial great saphenous vein was also interrogated. Spectral Doppler was utilized to evaluate flow at rest and with distal augmentation maneuvers in the common femoral, femoral and popliteal veins. COMPARISON:  None. FINDINGS: RIGHT LOWER EXTREMITY Common Femoral Vein: No evidence of thrombus. Normal compressibility, respiratory phasicity and response to augmentation. Saphenofemoral Junction: No evidence of thrombus. Normal compressibility and flow on color Doppler imaging. Profunda Femoral Vein: No evidence of thrombus. Normal compressibility and flow on color Doppler imaging. Femoral Vein: No evidence of thrombus. Normal compressibility, respiratory phasicity and response to augmentation. Popliteal Vein: No evidence of thrombus. Normal compressibility, respiratory phasicity and response to augmentation. Calf Veins: Veins of the calf are not well visualized. No obvious thrombus or other abnormality. Superficial Great  Saphenous Vein: No evidence of thrombus. Normal compressibility. Venous Reflux:  None. Other Findings: Diffuse soft tissue/interstitial edema seen throughout the subcutaneous soft tissues of the lower leg/calf. LEFT LOWER EXTREMITY Common Femoral Vein: No evidence of thrombus. Normal compressibility, respiratory phasicity and response to augmentation. Saphenofemoral Junction: No evidence of thrombus. Normal compressibility and flow on color Doppler imaging. Profunda Femoral Vein: No evidence of thrombus. Normal compressibility and flow on color Doppler imaging. Femoral Vein: No evidence of thrombus. Normal compressibility, respiratory phasicity and response to augmentation. Popliteal Vein: No evidence of thrombus. Normal compressibility, respiratory phasicity and response to augmentation. Calf Veins: Veins of the calf are not well visualized. No obvious thrombus or other abnormality. Superficial Great Saphenous Vein: No evidence of thrombus. Normal compressibility. Venous Reflux:  None. Other Findings: Diffuse soft tissue/interstitial edema seen throughout the subcutaneous soft tissues of the lower leg/calf. IMPRESSION: 1. No evidence of deep venous thrombosis in either lower extremity. 2. Diffuse soft tissue/interstitial edema within the lower legs/calves bilaterally. Electronically Signed   By: Jeannine Boga M.D.   On: 06/02/2019 22:44   DG Chest Port 1 View  Result Date: 06/04/2019 CLINICAL  DATA:  Aspiration EXAM: PORTABLE CHEST 1 VIEW COMPARISON:  06/02/2019 FINDINGS: Post CABG changes. Stable heart size. Atherosclerotic calcification of the aortic knob. Low lung volumes. Pulmonary vascular congestion with interstitial prominence, similar to prior. Probable small bilateral pleural effusions. No pneumothorax. IMPRESSION: Unchanged appearance of the chest with suggestion of CHF with mild edema. Electronically Signed   By: Davina Poke D.O.   On: 06/04/2019 15:23   ECHOCARDIOGRAM COMPLETE  Result  Date: 06/04/2019    ECHOCARDIOGRAM REPORT   Patient Name:   Joseph Flowers Date of Exam: 06/04/2019 Medical Rec #:  [id number]       Height:       70.0 in Accession #:    [id number]      Weight:       194.0 lb Date of Birth:  04/24/1927       BSA:          2.060 m Patient Age:    89 years        BP:           109/73 mmHg Patient Gender: M               HR:           94 bpm. Exam Location:  [facility] Procedure: 2D Echo Indications:     CHF-ACUTE DIASTOLIC [id number]  History:         Patient has prior history of Echocardiogram examinations, most                  recent 07/18/2015. CHF, CAD, Prior CABG,                  Signs/Symptoms:Shortness of Breath; Risk Factors:Diabetes.  Sonographer:     Avanell Shackleton Referring Phys:  [id number] [place] Diagnosing Phys: Serafina Royals MD  Sonographer Comments: I ATTEMPTED PEDOFF IN ALL VIEWS IMPRESSIONS  1. Left ventricular ejection fraction, by estimation, is <20%. The left ventricle has severely decreased function. The left ventricle demonstrates global hypokinesis. The left ventricular internal cavity size was moderately dilated. There is mild left ventricular hypertrophy. Left ventricular diastolic parameters were normal.  2. Right ventricular systolic function is normal. The right ventricular size is normal. There is moderately elevated pulmonary artery systolic pressure.  3. Left atrial size was moderately dilated.  4. Right atrial size was mildly dilated.  5. Large pleural effusion in the left lateral region.  6. The mitral valve is normal in structure. Moderate to severe mitral valve regurgitation.  7. Tricuspid valve regurgitation is moderate to severe.  8. The aortic valve is normal in structure. Aortic valve regurgitation is trivial. Moderate to severe aortic valve stenosis. FINDINGS  Left Ventricle: Left ventricular ejection fraction, by estimation, is <20%. The left ventricle has severely decreased function. The left ventricle demonstrates global hypokinesis. The  left ventricular internal cavity size was moderately dilated. There is mild left ventricular hypertrophy. Left ventricular diastolic parameters were normal. Right Ventricle: The right ventricular size is normal. No increase in right ventricular wall thickness. Right ventricular systolic function is normal. There is moderately elevated pulmonary artery systolic pressure. The tricuspid regurgitant velocity is 3.68 m/s, and with an assumed right atrial pressure of 10 mmHg, the estimated right ventricular systolic pressure is [id number]. Left Atrium: Left atrial size was moderately dilated. Right Atrium: Right atrial size was mildly dilated. Pericardium: There is no evidence of pericardial effusion. Mitral Valve: The mitral valve is normal in structure. Moderate to severe mitral  valve regurgitation. Tricuspid Valve: The tricuspid valve is normal in structure. Tricuspid valve regurgitation is moderate to severe. Aortic Valve: The aortic valve is normal in structure. Aortic valve regurgitation is trivial. Moderate to severe aortic stenosis is present. Aortic valve mean gradient measures 43.3 mmHg. Aortic valve peak gradient measures 64.1 mmHg. Aortic valve area, by VTI measures 0.36 cm. Pulmonic Valve: The pulmonic valve was normal in structure. Pulmonic valve regurgitation is mild. Aorta: The aortic root and ascending aorta are structurally normal, with no evidence of dilitation. IAS/Shunts: No atrial level shunt detected by color flow Doppler. Additional Comments: There is a large pleural effusion in the left lateral region.  LEFT VENTRICLE PLAX 2D LVIDd:         4.92 cm LVIDs:         4.28 cm LV PW:         0.94 cm LV IVS:        1.04 cm LVOT diam:     1.80 cm LV SV:         31 LV SV Index:   15 LVOT Area:     2.54 cm  LV Volumes (MOD) LV vol d, MOD A2C: 171.0 ml LV vol d, MOD A4C: 132.0 ml LV vol s, MOD A2C: 133.0 ml LV vol s, MOD A4C: 91.3 ml LV SV MOD A2C:     38.0 ml LV SV MOD A4C:     132.0 ml LV SV MOD BP:       38.2 ml IVC IVC diam: 2.53 cm LEFT ATRIUM             Index LA diam:        4.30 cm 2.09 cm/m LA Vol (A2C):   81.5 ml 39.55 ml/m LA Vol (A4C):   63.4 ml 30.77 ml/m LA Biplane Vol: 73.5 ml 35.67 ml/m  AORTIC VALVE AV Area (Vmax):    0.37 cm AV Area (Vmean):   0.33 cm AV Area (VTI):     0.36 cm AV Vmax:           400.33 cm/s AV Vmean:          314.667 cm/s AV VTI:            0.837 m AV Peak Grad:      64.1 mmHg AV Mean Grad:      43.3 mmHg LVOT Vmax:         58.70 cm/s LVOT Vmean:        40.500 cm/s LVOT VTI:          0.120 m LVOT/AV VTI ratio: 0.14  AORTA Ao Root diam: 3.60 cm MR Peak grad: 144.2 mmHg  TRICUSPID VALVE MR Mean grad: 85.7 mmHg   TR Peak grad:   54.2 mmHg MR Vmax:      600.33 cm/s TR Vmax:        368.00 cm/s MR Vmean:     430.3 cm/s                           SHUNTS                           Systemic VTI:  0.12 m                           Systemic Diam: 1.80 cm Serafina Royals MD Electronically signed by Serafina Royals  MD Signature Date/Time: 06/04/2019/12:33:36 PM    Final      Assessment and Recommendation  86 y.o. male with acute on chronic systolic dysfunction congestive heart failure with elevated BNP and known coronary artery disease status post coronary bypass graft hypertension hyperlipidemia diabetes with elevated troponin consistent with demand ischemia rather than acute coronary syndrome now slightly improved 1.  Continue intravenous Lasix for significant acute on chronic systolic dysfunction congestive heart failure.  Would consider continuing this for 1 full more day and considering changing to oral Lasix tomorrow 2.  No further cardiac diagnostic necessary due to no evidence of myocardial infarction and no apparent ability for aortic valve stenosis treatment surgically 3.  Continue metoprolol for heart rate control of atrial flutter with a goal heart rate between 60 and 80 bpm 4.  Anticoagulation for further risk reduction in stroke with atrial fibrillation with Eliquis  unchanged 5.  Begin ambulation and follow-up for improvements of symptoms and possible adjustments of medications as able after above 6.  Possible home health as well when preparing for discharge Signed, Serafina Royals M.D. FACC

## 2019-06-05 NOTE — Progress Notes (Signed)
PROGRESS NOTE    Joseph Flowers  [id number] DOB: 04/18/1927 DOA: 06/02/2019 PCP: Baxter Hire, MD      Brief Narrative:  Mr. Halsey is a 86 y.o. M with permanent A. fib status post atrial ablation on Eliquis, dCHF, DM, CAD s/p CABG remote, and HTN who presented with several days worsening dyspnea as well as severe lower extremity edema.  He noted his home oxygen down to 84% on ambulation.  In the ER he 04/27/1990, at bedtime troponin elevated slightly but stable.  Creatinine 1.2, potassium 5.6.  Bilateral lower extremity duplex negative for DVT, but there is severe swelling and redness of bilateral calves.  EKG showed atrial flutter with normal rate.  Patient was started on IV Lasix and admitted to the hospitalist service.            Assessment & Plan:  Acute on chronic systolic and diastolic CHF Aortic stenosis, moderate to severe Mitral regurgitation moderate to severe Admitted and started on IV Lasix.  Echo on 4/9 showed newly severely decreased EF <20%, as well as aortic stenosis and mitral regurgitation.  Also effusion.     Net negative 800cc, K trending down.  Cr stable.  -Continue Furosemide 80 mg IV twice a day  -Strict I/Os, daily weights, telemetry  -Daily monitoring renal function -Consult Cardiology -Consult palliative care -Hold metoprolol -Given patient's advanced age, advanced valve disease, he is not a candidate for advanced heart failure therapies (milrinone, LVAD)    Pleural effusion Will attempt diuresis  Permanent atrial fibrillation status post ablation Rate controlled -Continue Eliquis -Hold metoprolol  Diabetes Glucose controlled -Continue sliding scale corrections -Hold home Metformin and glimepiride  Coronary disease, secondary prevention Hypertension BP soft  -Hold home ramipril -Continue home simvastatin  Cellulitis, ruled out No leukocytosis, fever to suggest infection.  Symptoms bilateral, militates against diagnosis of  infection.  Hyperkalemia Lokelma given once on 4/10 -Continue IV Lasix  BPH -Continue finasteride       Disposition: Status is: Inpatient  Remains inpatient appropriate because:Inpatient level of care appropriate due to severity of illness   Dispo: The patient is from: Home              Anticipated d/c is to: SNF              Anticipated d/c date is: > 3 days              Patient currently is not medically stable to d/c.   Patient appears to be failing diuretic therapy, will consult Palliative Care.  If unable to tolerate further diuresis, may involve Hospice.               MDM: The below labs and imaging reports reviewed and summarized above.  Medication management as above. This is a severe exacerbation of his chronic disease.     DVT prophylaxis: Not applicable, on Eliquis Code Status: Full code Family Communication: daughter and sons at bedside    Consultants:   Cardiology  Procedures:   Echo -- severely reduced EF  Antimicrobials:      Culture data:              Subjective: The patient has generalized weakness, still some shortness of breath.  No respiratory distress.  Swelling still severe.        Objective: Vitals:   06/05/19 0801 06/05/19 0848 06/05/19 0849 06/05/19 1108  BP: 94/67  91/72 94/62  Pulse: 77 79 77 76  Resp: 19  19  Temp: (!) 97 F (36.1 C)   97.6 F (36.4 C)  TempSrc:    Oral  SpO2: 97% 97%  96%  Weight:      Height:        Intake/Output Summary (Last 24 hours) at 06/05/2019 1643 Last data filed at 06/05/2019 1345 Gross per 24 hour  Intake 480 ml  Output 1150 ml  Net -670 ml   Filed Weights   06/03/19 1216 06/04/19 0422 06/05/19 0426  Weight: 88.9 kg 88 kg 89.7 kg    Examination: General appearance: Frail elderly adult male, alert and in no acute distress.  But appears generally weak. HEENT: Anicteric, conjunctiva pink, lids and lashes normal. 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  Lips moist, teeth normal. OP dry, no oral lesions.   Skin: Warm and dry.  No suspicious rashes or lesions. Cardiac: RRR, JVP elevated to the earlobe, systolic murmur noted.  3+ marked bilateral LE edema peripheral redness.    Respiratory: Respiratory rate increased, rales bilaterall at bases y, diminished throughout Abdomen: Abdomen soft.  No tenderness palpation or guarding. No ascites, distension, hepatosplenomegaly.   MSK: No deformities or effusions of the large joints of the upper or lower extremities bilaterally.  Diffuse loss of subcutaneous muscle mass and fat Neuro: Awake and alert. Naming is grossly intact, and the patient's recall, recent and remote, as well as general fund of knowledge seem within normal limits.  Muscle tone diminished, without fasciculations.  Moves all extremities equally but with severe generalized weakness.  Speech fluent.    Psych: Sensorium intact and responding to questions, attention normal. Affect normal.  Judgment and insight appear moderately impaired.        Data Reviewed: I have personally reviewed following labs and imaging studies:  CBC: Recent Labs  Lab 06/02/19 2124 06/03/19 0336 06/04/19 0441 06/05/19 0431  WBC 8.0 7.9 8.2 8.1  NEUTROABS  --  6.0 5.4 5.1  HGB 14.7 15.1 14.6 13.9  HCT 45.1 45.6 44.2 43.1  MCV 89.7 88.4 88.9 91.3  PLT 224 224 208 [id number]   Basic Metabolic Panel: Recent Labs  Lab 06/02/19 2124 06/03/19 0336 06/04/19 0441 06/05/19 0431  NA 138  --  137 135  K 5.6*  --  5.8* 5.2*  CL 104  --  103 100  CO2 24  --  26 28  GLUCOSE 179*  --  106* 104*  BUN 38*  --  43* 45*  CREATININE 1.21  --  1.32* 1.36*  CALCIUM 9.5  --  9.1 8.6*  MG  --  2.2  --   --    GFR: Estimated Creatinine Clearance: 41.5 mL/min (A) (by C-G formula based on SCr of 1.36 mg/dL (H)). Liver Function Tests: Recent Labs  Lab 06/02/19 2124  AST 37  ALT 36  ALKPHOS 153*  BILITOT 1.2  PROT 6.5    ALBUMIN 3.1*   No results for input(s): LIPASE, AMYLASE in the last 168 hours. No results for input(s): AMMONIA in the last 168 hours. Coagulation Profile: No results for input(s): INR, PROTIME in the last 168 hours. Cardiac Enzymes: No results for input(s): CKTOTAL, CKMB, CKMBINDEX, TROPONINI in the last 168 hours. BNP (last 3 results) No results for input(s): PROBNP in the last 8760 hours. HbA1C: Recent Labs    06/03/19 0134  HGBA1C 8.5*   CBG: Recent Labs  Lab 06/04/19 1215 06/04/19 1559 06/04/19 2109 06/05/19 0801 06/05/19  1136  GLUCAP 191* 205* 242* 104* 207*   Lipid Profile: No results for input(s): CHOL, HDL, LDLCALC, TRIG, CHOLHDL, LDLDIRECT in the last 72 hours. Thyroid Function Tests: Recent Labs    06/03/19 0336  TSH 7.684*   Anemia Panel: No results for input(s): VITAMINB12, FOLATE, FERRITIN, TIBC, IRON, RETICCTPCT in the last 72 hours. Urine analysis:    Component Value Date/Time   COLORURINE YELLOW (A) 07/14/2015 1035   APPEARANCEUR CLEAR (A) 07/14/2015 1035   LABSPEC 1.026 07/14/2015 1035   PHURINE 5.0 07/14/2015 1035   GLUCOSEU >500 (A) 07/14/2015 1035   HGBUR 1+ (A) 07/14/2015 1035   BILIRUBINUR NEGATIVE 07/14/2015 1035   KETONESUR 1+ (A) 07/14/2015 1035   PROTEINUR 30 (A) 07/14/2015 1035   NITRITE NEGATIVE 07/14/2015 1035   LEUKOCYTESUR NEGATIVE 07/14/2015 1035   Sepsis Labs: @LABRCNTIP (procalcitonin:4,lacticacidven:4)  ) Recent Results (from the past 240 hour(s))  Respiratory Panel by RT PCR (Flu A&B, Covid) - Nasopharyngeal Swab     Status: None   Collection Time: 06/03/19  1:34 AM   Specimen: Nasopharyngeal Swab  Result Value Ref Range Status   SARS Coronavirus 2 by RT PCR NEGATIVE NEGATIVE Final    Comment: (NOTE) SARS-CoV-2 target nucleic acids are NOT DETECTED. The SARS-CoV-2 RNA is generally detectable in upper respiratoy specimens during the acute phase of infection. The lowest concentration of SARS-CoV-2 viral copies this  assay can detect is 131 copies/mL. A negative result does not preclude SARS-Cov-2 infection and should not be used as the sole basis for treatment or other patient management decisions. A negative result may occur with  improper specimen collection/handling, submission of specimen other than nasopharyngeal swab, presence of viral mutation(s) within the areas targeted by this assay, and inadequate number of viral copies (<131 copies/mL). A negative result must be combined with clinical observations, patient history, and epidemiological information. The expected result is Negative. Fact Sheet for Patients:  PinkCheek.be Fact Sheet for Healthcare Providers:  GravelBags.it This test is not yet ap proved or cleared by the [place] FDA and  has been authorized for detection and/or diagnosis of SARS-CoV-2 by FDA under an Emergency Use Authorization (EUA). This EUA will remain  in effect (meaning this test can be used) for the duration of the COVID-19 declaration under Section 564(b)(1) of the Act, 21 U.S.C. section 360bbb-3(b)(1), unless the authorization is terminated or revoked sooner.    Influenza A by PCR NEGATIVE NEGATIVE Final   Influenza B by PCR NEGATIVE NEGATIVE Final    Comment: (NOTE) The Xpert Xpress SARS-CoV-2/FLU/RSV assay is intended as an aid in  the diagnosis of influenza from Nasopharyngeal swab specimens and  should not be used as a sole basis for treatment. Nasal washings and  aspirates are unacceptable for Xpert Xpress SARS-CoV-2/FLU/RSV  testing. Fact Sheet for Patients: PinkCheek.be Fact Sheet for Healthcare Providers: GravelBags.it This test is not yet approved or cleared by the [place] FDA and  has been authorized for detection and/or diagnosis of SARS-CoV-2 by  FDA under an Emergency Use Authorization (EUA). This EUA will remain  in  effect (meaning this test can be used) for the duration of the  Covid-19 declaration under Section 564(b)(1) of the Act, 21  U.S.C. section 360bbb-3(b)(1), unless the authorization is  terminated or revoked. Performed at [facility], [address], [place], [place] [address]          Radiology Studies: [facility] Chest [place] 1 View  Result Date: 06/04/2019 CLINICAL DATA:  Aspiration EXAM: PORTABLE CHEST  1 VIEW COMPARISON:  06/02/2019 FINDINGS: Post CABG changes. Stable heart size. Atherosclerotic calcification of the aortic knob. Low lung volumes. Pulmonary vascular congestion with interstitial prominence, similar to prior. Probable small bilateral pleural effusions. No pneumothorax. IMPRESSION: Unchanged appearance of the chest with suggestion of CHF with mild edema. Electronically Signed   By: Davina Poke D.O.   On: 06/04/2019 15:23   ECHOCARDIOGRAM COMPLETE  Result Date: 06/04/2019    ECHOCARDIOGRAM REPORT   Patient Name:   GEORGIY SCALISI Date of Exam: 06/04/2019 Medical Rec #:  [id number]       Height:       70.0 in Accession #:    [id number]      Weight:       194.0 lb Date of Birth:  1927/08/20       BSA:          2.060 m Patient Age:    22 years        BP:           109/73 mmHg Patient Gender: M               HR:           94 bpm. Exam Location:  [facility] Procedure: 2D Echo Indications:     CHF-ACUTE DIASTOLIC 0000000  History:         Patient has prior history of Echocardiogram examinations, most                  recent 07/18/2015. CHF, CAD, Prior CABG,                  Signs/Symptoms:Shortness of Breath; Risk Factors:Diabetes.  Sonographer:     Avanell Shackleton Referring Phys:  [id number] [place] Diagnosing Phys: Serafina Royals MD  Sonographer Comments: I ATTEMPTED PEDOFF IN ALL VIEWS IMPRESSIONS  1. Left ventricular ejection fraction, by estimation, is <20%. The left ventricle has severely decreased function. The left ventricle demonstrates global hypokinesis. The left  ventricular internal cavity size was moderately dilated. There is mild left ventricular hypertrophy. Left ventricular diastolic parameters were normal.  2. Right ventricular systolic function is normal. The right ventricular size is normal. There is moderately elevated pulmonary artery systolic pressure.  3. Left atrial size was moderately dilated.  4. Right atrial size was mildly dilated.  5. Large pleural effusion in the left lateral region.  6. The mitral valve is normal in structure. Moderate to severe mitral valve regurgitation.  7. Tricuspid valve regurgitation is moderate to severe.  8. The aortic valve is normal in structure. Aortic valve regurgitation is trivial. Moderate to severe aortic valve stenosis. FINDINGS  Left Ventricle: Left ventricular ejection fraction, by estimation, is <20%. The left ventricle has severely decreased function. The left ventricle demonstrates global hypokinesis. The left ventricular internal cavity size was moderately dilated. There is mild left ventricular hypertrophy. Left ventricular diastolic parameters were normal. Right Ventricle: The right ventricular size is normal. No increase in right ventricular wall thickness. Right ventricular systolic function is normal. There is moderately elevated pulmonary artery systolic pressure. The tricuspid regurgitant velocity is 3.68 m/s, and with an assumed right atrial pressure of 10 mmHg, the estimated right ventricular systolic pressure is [id number]. Left Atrium: Left atrial size was moderately dilated. Right Atrium: Right atrial size was mildly dilated. Pericardium: There is no evidence of pericardial effusion. Mitral Valve: The mitral valve is normal in structure. Moderate to severe mitral valve regurgitation. Tricuspid Valve: The tricuspid valve  is normal in structure. Tricuspid valve regurgitation is moderate to severe. Aortic Valve: The aortic valve is normal in structure. Aortic valve regurgitation is trivial. Moderate to severe  aortic stenosis is present. Aortic valve mean gradient measures 43.3 mmHg. Aortic valve peak gradient measures 64.1 mmHg. Aortic valve area, by VTI measures 0.36 cm. Pulmonic Valve: The pulmonic valve was normal in structure. Pulmonic valve regurgitation is mild. Aorta: The aortic root and ascending aorta are structurally normal, with no evidence of dilitation. IAS/Shunts: No atrial level shunt detected by color flow Doppler. Additional Comments: There is a large pleural effusion in the left lateral region.  LEFT VENTRICLE PLAX 2D LVIDd:         4.92 cm LVIDs:         4.28 cm LV PW:         0.94 cm LV IVS:        1.04 cm LVOT diam:     1.80 cm LV SV:         31 LV SV Index:   15 LVOT Area:     2.54 cm  LV Volumes (MOD) LV vol d, MOD A2C: 171.0 ml LV vol d, MOD A4C: 132.0 ml LV vol s, MOD A2C: 133.0 ml LV vol s, MOD A4C: 91.3 ml LV SV MOD A2C:     38.0 ml LV SV MOD A4C:     132.0 ml LV SV MOD BP:      38.2 ml IVC IVC diam: 2.53 cm LEFT ATRIUM             Index LA diam:        4.30 cm 2.09 cm/m LA Vol (A2C):   81.5 ml 39.55 ml/m LA Vol (A4C):   63.4 ml 30.77 ml/m LA Biplane Vol: 73.5 ml 35.67 ml/m  AORTIC VALVE AV Area (Vmax):    0.37 cm AV Area (Vmean):   0.33 cm AV Area (VTI):     0.36 cm AV Vmax:           400.33 cm/s AV Vmean:          314.667 cm/s AV VTI:            0.837 m AV Peak Grad:      64.1 mmHg AV Mean Grad:      43.3 mmHg LVOT Vmax:         58.70 cm/s LVOT Vmean:        40.500 cm/s LVOT VTI:          0.120 m LVOT/AV VTI ratio: 0.14  AORTA Ao Root diam: 3.60 cm MR Peak grad: 144.2 mmHg  TRICUSPID VALVE MR Mean grad: 85.7 mmHg   TR Peak grad:   54.2 mmHg MR Vmax:      600.33 cm/s TR Vmax:        368.00 cm/s MR Vmean:     430.3 cm/s                           SHUNTS                           Systemic VTI:  0.12 m                           Systemic Diam: 1.80 cm Serafina Royals MD Electronically signed by Serafina Royals MD Signature Date/Time: 06/04/2019/12:33:36 PM  Final         Scheduled  Meds: . apixaban  5 mg Oral BID  . aspirin EC  81 mg Oral BID  . collagenase   Topical QODAY  . feeding supplement (NEPRO CARB STEADY)  237 mL Oral BID BM  . finasteride  5 mg Oral Daily  . furosemide  80 mg Intravenous BID  . insulin aspart  0-9 Units Subcutaneous TID PC & HS  . mouth rinse  15 mL Mouth Rinse BID  . simvastatin  20 mg Oral QHS  . sodium chloride flush  3 mL Intravenous Q12H   Continuous Infusions: . sodium chloride       LOS: 2 days    Time spent: 35 minutes     Edwin Dada, MD [facility] 06/05/2019, 4:43 PM     Please page though [place] or Epic secure chat:  For [employer], Adult nurse

## 2019-06-05 NOTE — Progress Notes (Signed)
Physical Therapy Evaluation Patient Details Name: Joseph Flowers MRN: [id number] DOB: 05-16-1927 Today's Date: 06/05/2019   History of Present Illness  per MD note:Bubba Eberl  is a 86 y.o. Caucasian male with a known history of atrial ablation, diastolic CHF, type diabetes mellitus, coronary artery disease status post CABG, hypertension and dyslipidemia, who presented to the emergency room with acute onset of worsening dyspnea as well as lower extremity edema.  The patient has been having dyspnea on exertion.  He denies any significant cough or wheezing. He was noted to have low pulse oximetry that was down to 84% on ambulation on room air  Clinical Impression  Patient agrees to PT evaluation. Patient has decreased strength in BLE and swelling in LE's. He needs mod assist for supine <> sit bed mobility. He stands partially up but is breathing very hard after bed mobility to be able to stand up. He is able to scoot up the bed with 4 scoots from sitting EOB. He has drainage from BLE fluid in legs and skin is red BLE below his knees to ankles. He has fair sitting balance with difficulty in sitting to maintain his center of gravity. He will continue to benefit from skilled PT to improve strength and mobility and safety.     Follow Up Recommendations SNF    Equipment Recommendations  Rolling walker with 5" wheels    Recommendations for Other Services       Precautions / Restrictions Precautions Precautions: Fall Restrictions Weight Bearing Restrictions: No      Mobility  Bed Mobility Overal bed mobility: Needs Assistance Bed Mobility: Supine to Sit;Sit to Supine     Supine to sit: Max assist Sit to supine: Max assist   General bed mobility comments: needs VC for sequencing and where to place UE (heavy breathing from supine to sit mobility)  Transfers Overall transfer level: Needs assistance Equipment used: Rolling walker (2 wheeled) Transfers: Sit to/from Stand Sit to Stand:  Mod assist         General transfer comment: (unable to fully stand, was able to stand partially)  Ambulation/Gait Ambulation/Gait assistance: (unable, breathing hard just sitting at edge of bed)              Stairs            Wheelchair Mobility    Modified Rankin (Stroke Patients Only)       Balance Overall balance assessment: Needs assistance Sitting-balance support: Bilateral upper extremity supported;Feet supported Sitting balance-Leahy Scale: Fair   Postural control: Posterior lean                                   Pertinent Vitals/Pain Pain Assessment: No/denies pain    Home Living Family/patient expects to be discharged to:: Skilled nursing facility Living Arrangements: Alone                    Prior Function Level of Independence: Independent with assistive device(s)         Comments: (Patient was slowly declining and needed more assist recently)     Hand Dominance   Dominant Hand: Right    Extremity/Trunk Assessment   Upper Extremity Assessment Upper Extremity Assessment: Generalized weakness    Lower Extremity Assessment Lower Extremity Assessment: Generalized weakness;RLE deficits/detail;LLE deficits/detail RLE Deficits / Details: (-3/5 hip flex, knee ext) LLE Deficits / Details: (-3/5 hip flex, knee ext)  Communication   Communication: No difficulties  Cognition Arousal/Alertness: Awake/alert Behavior During Therapy: WFL for tasks assessed/performed Overall Cognitive Status: Within Functional Limits for tasks assessed                                        General Comments General comments (skin integrity, edema, etc.): (red shin bilaterally below knees)    Exercises     Assessment/Plan    PT Assessment Patient needs continued PT services  PT Problem List Decreased strength;Decreased activity tolerance;Decreased balance;Decreased mobility;Decreased coordination       PT  Treatment Interventions Gait training;Stair training;Therapeutic activities;Therapeutic exercise    PT Goals (Current goals can be found in the Care Plan section)  Acute Rehab PT Goals Patient Stated Goal: to get up PT Goal Formulation: All assessment and education complete, DC therapy Time For Goal Achievement: 06/19/19 Potential to Achieve Goals: Fair    Frequency Min 2X/week   Barriers to discharge Decreased caregiver support      Co-evaluation               AM-PAC PT "6 Clicks" Mobility  Outcome Measure Help needed turning from your back to your side while in a flat bed without using bedrails?: A Lot Help needed moving from lying on your back to sitting on the side of a flat bed without using bedrails?: A Lot Help needed moving to and from a bed to a chair (including a wheelchair)?: Total Help needed standing up from a chair using your arms (e.g., wheelchair or bedside chair)?: Total Help needed to walk in hospital room?: Total Help needed climbing 3-5 steps with a railing? : Total 6 Click Score: 8    End of Session Equipment Utilized During Treatment: Gait belt Activity Tolerance: Patient limited by fatigue Patient left: in bed;with bed alarm set;with family/visitor present Nurse Communication: Mobility status PT Visit Diagnosis: Unsteadiness on feet (R26.81);Repeated falls (R29.6);Muscle weakness (generalized) (M62.81);History of falling (Z91.81);Difficulty in walking, not elsewhere classified (R26.2)    Time: 1250-1315 PT Time Calculation (min) (ACUTE ONLY): 25 min   Charges:   PT Evaluation $PT Eval Low Complexity: 1 Low PT Treatments $Therapeutic Activity: 8-22 mins          Alanson Puls, PT DPT 06/05/2019, 2:36 PM

## 2019-06-05 NOTE — Plan of Care (Signed)
?  Problem: Clinical Measurements: ?Goal: Will remain free from infection ?Outcome: Progressing ?  ?Problem: Clinical Measurements: ?Goal: Diagnostic test results will improve ?Outcome: Progressing ?  ?

## 2019-06-06 DIAGNOSIS — I5031 Acute diastolic (congestive) heart failure: Secondary | ICD-10-CM | POA: Diagnosis not present

## 2019-06-06 LAB — GLUCOSE, CAPILLARY
Glucose-Capillary: 147 mg/dL — ABNORMAL HIGH (ref 70–99)
Glucose-Capillary: 178 mg/dL — ABNORMAL HIGH (ref 70–99)
Glucose-Capillary: 195 mg/dL — ABNORMAL HIGH (ref 70–99)
Glucose-Capillary: 204 mg/dL — ABNORMAL HIGH (ref 70–99)

## 2019-06-06 LAB — CBC WITH DIFFERENTIAL/PLATELET
Abs Immature Granulocytes: 0.02 10*3/uL (ref 0.00–0.07)
Basophils Absolute: 0.1 10*3/uL (ref 0.0–0.1)
Basophils Relative: 1 %
Eosinophils Absolute: 0.8 10*3/uL — ABNORMAL HIGH (ref 0.0–0.5)
Eosinophils Relative: 12 %
HCT: 42.3 % (ref 39.0–52.0)
Hemoglobin: 13.9 g/dL (ref 13.0–17.0)
Immature Granulocytes: 0 %
Lymphocytes Relative: 14 %
Lymphs Abs: 1 10*3/uL (ref 0.7–4.0)
MCH: 29.3 pg (ref 26.0–34.0)
MCHC: 32.9 g/dL (ref 30.0–36.0)
MCV: 89.1 fL (ref 80.0–100.0)
Monocytes Absolute: 0.6 10*3/uL (ref 0.1–1.0)
Monocytes Relative: 8 %
Neutro Abs: 4.7 10*3/uL (ref 1.7–7.7)
Neutrophils Relative %: 65 %
Platelets: 211 10*3/uL (ref 150–400)
RBC: 4.75 MIL/uL (ref 4.22–5.81)
RDW: 14.5 % (ref 11.5–15.5)
WBC: 7.1 10*3/uL (ref 4.0–10.5)
nRBC: 0 % (ref 0.0–0.2)

## 2019-06-06 LAB — BASIC METABOLIC PANEL
Anion gap: 9 (ref 5–15)
BUN: 47 mg/dL — ABNORMAL HIGH (ref 8–23)
CO2: 27 mmol/L (ref 22–32)
Calcium: 8.6 mg/dL — ABNORMAL LOW (ref 8.9–10.3)
Chloride: 99 mmol/L (ref 98–111)
Creatinine, Ser: 1.06 mg/dL (ref 0.61–1.24)
GFR calc Af Amer: >60 mL/min (ref >60)
GFR calc non Af Amer: >60 mL/min (ref >60)
Glucose, Bld: 170 mg/dL — ABNORMAL HIGH (ref 70–99)
Potassium: 3.8 mmol/L (ref 3.5–5.1)
Sodium: 135 mmol/L (ref 135–145)

## 2019-06-06 MED ORDER — METOPROLOL SUCCINATE ER 50 MG PO TB24
50.0000 mg | ORAL_TABLET | Freq: Every day | ORAL | Status: DC
  Administered 2019-06-06 – 2019-06-09 (×4): 50 mg via ORAL
  Filled 2019-06-06 (×4): qty 1

## 2019-06-06 NOTE — Consult Note (Signed)
WOC Nurse Consult Note: Reason for Consult: apply Unna's boots Wound type: venous stasis and fluid volume overload Pressure Injury POA: NA Erythematous bilateral LE; areas affected consistent with venous stasis.  Redness and edema noted as well as weeping which is greater on the RLE than the LE.    Dressing procedure/placement/frequency: Unna's boots applied bilaterally; patient tolerated well  [place] nurse team will follow along for support with weekly changes next week if patient still inpatient.   Discussed POC with patient and bedside nurse.  Re consult if needed, will not follow at this time. Thanks  Page Pucciarelli [employer], RN,CWOCN, CNS, [place] [phone])

## 2019-06-06 NOTE — Care Management Important Message (Signed)
Important Message  Patient Details  Name: CORDE MOURER MRN: [id number] Date of Birth: 1927-08-03   Medicare Important Message Given:  Yes     Dannette Barbara 06/06/2019, 12:51 PM

## 2019-06-06 NOTE — Progress Notes (Signed)
[facility] Encounter Note  Patient: Joseph Flowers / Admit Date: 06/02/2019 / Date of Encounter: 06/06/2019, 7:55 AM   Subjective: Patient is slightly improved from last night.  Slight improvements in lower extremity edema and PND orthopnea.  After review of ins and outs it does appear that the patient continues to have daily output Echocardiogram showing severe LV systolic dysfunction with ejection fraction of 20% with moderate aortic stenosis moderate to severe mitral regurgitation appears to be significantly changed from before and likely the primary cause of current admission Overall patient has relatively poor prognosis long-term with multiple issues above Review of Systems: Positive for: Shortness of breath edema Negative for: Vision change, hearing change, syncope, dizziness, nausea, vomiting,diarrhea, bloody stool, stomach pain, cough, congestion, diaphoresis, urinary frequency, urinary pain,skin lesions, skin rashes Others previously listed  Objective: Telemetry: Atrial flutter with bundle branch block Physical Exam: Blood pressure 111/73, pulse 79, temperature 98 F (36.7 C), temperature source Oral, resp. rate 20, height 5\' 10"  (1.778 m), weight 88.3 kg, SpO2 96 %. Body mass index is 27.92 kg/m. General: Well developed, well nourished, in no acute distress. Head: Normocephalic, atraumatic, sclera non-icteric, no xanthomas, nares are without discharge. Neck: No apparent masses Lungs: Normal respirations with few to wheezes, no rhonchi, Xolair basilar rales , no crackles   Heart: Regular rate and rhythm, normal S1 S2, no 3+ aortic murmur, no rub, no gallop, PMI is normal size and placement, carotid upstroke normal without bruit, jugular venous pressure normal Abdomen: Soft, non-tender, non-distended with normoactive bowel sounds. No hepatosplenomegaly. Abdominal aorta is normal size without bruit Extremities: 2+ edema, no clubbing, no cyanosis, positive  ulcers,  Peripheral: 2+ radial, 2+ femoral, 2+ dorsal pedal pulses Neuro: Alert and oriented. Moves all extremities spontaneously. Psych:  Responds to questions appropriately with a normal affect.   Intake/Output Summary (Last 24 hours) at 06/06/2019 0755 Last data filed at 06/06/2019 0459 Gross per 24 hour  Intake 240 ml  Output 1800 ml  Net -1560 ml    Inpatient Medications:  . apixaban  5 mg Oral BID  . aspirin EC  81 mg Oral BID  . collagenase   Topical QODAY  . feeding supplement (NEPRO CARB STEADY)  237 mL Oral BID BM  . finasteride  5 mg Oral Daily  . furosemide  80 mg Intravenous BID  . insulin aspart  0-9 Units Subcutaneous TID PC & HS  . mouth rinse  15 mL Mouth Rinse BID  . simvastatin  20 mg Oral QHS  . sodium chloride flush  3 mL Intravenous Q12H   Infusions:  . sodium chloride      Labs: Recent Labs    06/05/19 0431 06/06/19 0522  NA 135 135  K 5.2* 3.8  CL 100 99  CO2 28 27  GLUCOSE 104* 170*  BUN 45* 47*  CREATININE 1.36* 1.06  CALCIUM 8.6* 8.6*   No results for input(s): AST, ALT, ALKPHOS, BILITOT, PROT, ALBUMIN in the last 72 hours. Recent Labs    06/05/19 0431 06/06/19 0522  WBC 8.1 7.1  NEUTROABS 5.1 4.7  HGB 13.9 13.9  HCT 43.1 42.3  MCV 91.3 89.1  PLT 229 211   No results for input(s): CKTOTAL, CKMB, TROPONINI in the last 72 hours. Invalid input(s): POCBNP No results for input(s): HGBA1C in the last 72 hours.   Weights: Filed Weights   06/04/19 0422 06/05/19 0426 06/06/19 0455  Weight: 88 kg 89.7 kg 88.3 kg     Radiology/Studies:  DG Chest 2 View  Result Date: 06/02/2019 CLINICAL DATA:  86 year old male with shortness of breath EXAM: CHEST - 2 VIEW COMPARISON:  Chest radiograph dated 07/18/2015. FINDINGS: There is mild cardiomegaly with vascular congestion and probable trace bilateral pleural effusions. Pneumonia is not excluded. Clinical correlation is recommended. Median sternotomy wires and CABG vascular clips noted.  Atherosclerotic calcification of the aortic arch. Osteopenia with degenerative changes of the spine. No acute osseous pathology. IMPRESSION: Mild cardiomegaly with findings of CHF. Pneumonia is not excluded. Electronically Signed   By: Anner Crete M.D.   On: 06/02/2019 21:03   US Venous Img Lower Bilateral  Result Date: 06/02/2019 CLINICAL DATA:  Initial evaluation for acute lower extremities swelling for 3 weeks EXAM: BILATERAL LOWER EXTREMITY VENOUS DOPPLER ULTRASOUND TECHNIQUE: Gray-scale sonography with graded compression, as well as color Doppler and duplex ultrasound were performed to evaluate the lower extremity deep venous systems from the level of the common femoral vein and including the common femoral, femoral, profunda femoral, popliteal and calf veins including the posterior tibial, peroneal and gastrocnemius veins when visible. The superficial great saphenous vein was also interrogated. Spectral Doppler was utilized to evaluate flow at rest and with distal augmentation maneuvers in the common femoral, femoral and popliteal veins. COMPARISON:  None. FINDINGS: RIGHT LOWER EXTREMITY Common Femoral Vein: No evidence of thrombus. Normal compressibility, respiratory phasicity and response to augmentation. Saphenofemoral Junction: No evidence of thrombus. Normal compressibility and flow on color Doppler imaging. Profunda Femoral Vein: No evidence of thrombus. Normal compressibility and flow on color Doppler imaging. Femoral Vein: No evidence of thrombus. Normal compressibility, respiratory phasicity and response to augmentation. Popliteal Vein: No evidence of thrombus. Normal compressibility, respiratory phasicity and response to augmentation. Calf Veins: Veins of the calf are not well visualized. No obvious thrombus or other abnormality. Superficial Great Saphenous Vein: No evidence of thrombus. Normal compressibility. Venous Reflux:  None. Other Findings: Diffuse soft tissue/interstitial edema seen  throughout the subcutaneous soft tissues of the lower leg/calf. LEFT LOWER EXTREMITY Common Femoral Vein: No evidence of thrombus. Normal compressibility, respiratory phasicity and response to augmentation. Saphenofemoral Junction: No evidence of thrombus. Normal compressibility and flow on color Doppler imaging. Profunda Femoral Vein: No evidence of thrombus. Normal compressibility and flow on color Doppler imaging. Femoral Vein: No evidence of thrombus. Normal compressibility, respiratory phasicity and response to augmentation. Popliteal Vein: No evidence of thrombus. Normal compressibility, respiratory phasicity and response to augmentation. Calf Veins: Veins of the calf are not well visualized. No obvious thrombus or other abnormality. Superficial Great Saphenous Vein: No evidence of thrombus. Normal compressibility. Venous Reflux:  None. Other Findings: Diffuse soft tissue/interstitial edema seen throughout the subcutaneous soft tissues of the lower leg/calf. IMPRESSION: 1. No evidence of deep venous thrombosis in either lower extremity. 2. Diffuse soft tissue/interstitial edema within the lower legs/calves bilaterally. Electronically Signed   By: Jeannine Boga M.D.   On: 06/02/2019 22:44   DG Chest Port 1 View  Result Date: 06/04/2019 CLINICAL DATA:  Aspiration EXAM: PORTABLE CHEST 1 VIEW COMPARISON:  06/02/2019 FINDINGS: Post CABG changes. Stable heart size. Atherosclerotic calcification of the aortic knob. Low lung volumes. Pulmonary vascular congestion with interstitial prominence, similar to prior. Probable small bilateral pleural effusions. No pneumothorax. IMPRESSION: Unchanged appearance of the chest with suggestion of CHF with mild edema. Electronically Signed   By: Davina Poke D.O.   On: 06/04/2019 15:23   ECHOCARDIOGRAM COMPLETE  Result Date: 06/04/2019    ECHOCARDIOGRAM REPORT   Patient Name:  Alfredia Client Date of Exam: 06/04/2019 Medical Rec #:  [id number]       Height:        70.0 in Accession #:    [id number]      Weight:       194.0 lb Date of Birth:  [birth year]       BSA:          2.060 m Patient Age:    19 years        BP:           109/73 mmHg Patient Gender: M               HR:           94 bpm. Exam Location:  [facility] Procedure: 2D Echo Indications:     CHF-ACUTE DIASTOLIC 0000000  History:         Patient has prior history of Echocardiogram examinations, most                  recent 07/18/2015. CHF, CAD, Prior CABG,                  Signs/Symptoms:Shortness of Breath; Risk Factors:Diabetes.  Sonographer:     Avanell Shackleton Referring Phys:  [id number] Olga Diagnosing Phys: Serafina Royals MD  Sonographer Comments: I ATTEMPTED PEDOFF IN ALL VIEWS IMPRESSIONS  1. Left ventricular ejection fraction, by estimation, is <20%. The left ventricle has severely decreased function. The left ventricle demonstrates global hypokinesis. The left ventricular internal cavity size was moderately dilated. There is mild left ventricular hypertrophy. Left ventricular diastolic parameters were normal.  2. Right ventricular systolic function is normal. The right ventricular size is normal. There is moderately elevated pulmonary artery systolic pressure.  3. Left atrial size was moderately dilated.  4. Right atrial size was mildly dilated.  5. Large pleural effusion in the left lateral region.  6. The mitral valve is normal in structure. Moderate to severe mitral valve regurgitation.  7. Tricuspid valve regurgitation is moderate to severe.  8. The aortic valve is normal in structure. Aortic valve regurgitation is trivial. Moderate to severe aortic valve stenosis. FINDINGS  Left Ventricle: Left ventricular ejection fraction, by estimation, is <20%. The left ventricle has severely decreased function. The left ventricle demonstrates global hypokinesis. The left ventricular internal cavity size was moderately dilated. There is mild left ventricular hypertrophy. Left ventricular diastolic parameters were  normal. Right Ventricle: The right ventricular size is normal. No increase in right ventricular wall thickness. Right ventricular systolic function is normal. There is moderately elevated pulmonary artery systolic pressure. The tricuspid regurgitant velocity is 3.68 m/s, and with an assumed right atrial pressure of 10 mmHg, the estimated right ventricular systolic pressure is [id number]. Left Atrium: Left atrial size was moderately dilated. Right Atrium: Right atrial size was mildly dilated. Pericardium: There is no evidence of pericardial effusion. Mitral Valve: The mitral valve is normal in structure. Moderate to severe mitral valve regurgitation. Tricuspid Valve: The tricuspid valve is normal in structure. Tricuspid valve regurgitation is moderate to severe. Aortic Valve: The aortic valve is normal in structure. Aortic valve regurgitation is trivial. Moderate to severe aortic stenosis is present. Aortic valve mean gradient measures 43.3 mmHg. Aortic valve peak gradient measures 64.1 mmHg. Aortic valve area, by VTI measures 0.36 cm. Pulmonic Valve: The pulmonic valve was normal in structure. Pulmonic valve regurgitation is mild. Aorta: The aortic root and ascending aorta are structurally normal, with no evidence of  dilitation. IAS/Shunts: No atrial level shunt detected by color flow Doppler. Additional Comments: There is a large pleural effusion in the left lateral region.  LEFT VENTRICLE PLAX 2D LVIDd:         4.92 cm LVIDs:         4.28 cm LV PW:         0.94 cm LV IVS:        1.04 cm LVOT diam:     1.80 cm LV SV:         31 LV SV Index:   15 LVOT Area:     2.54 cm  LV Volumes (MOD) LV vol d, MOD A2C: 171.0 ml LV vol d, MOD A4C: 132.0 ml LV vol s, MOD A2C: 133.0 ml LV vol s, MOD A4C: 91.3 ml LV SV MOD A2C:     38.0 ml LV SV MOD A4C:     132.0 ml LV SV MOD BP:      38.2 ml IVC IVC diam: 2.53 cm LEFT ATRIUM             Index LA diam:        4.30 cm 2.09 cm/m LA Vol (A2C):   81.5 ml 39.55 ml/m LA Vol (A4C):    63.4 ml 30.77 ml/m LA Biplane Vol: 73.5 ml 35.67 ml/m  AORTIC VALVE AV Area (Vmax):    0.37 cm AV Area (Vmean):   0.33 cm AV Area (VTI):     0.36 cm AV Vmax:           400.33 cm/s AV Vmean:          314.667 cm/s AV VTI:            0.837 m AV Peak Grad:      64.1 mmHg AV Mean Grad:      43.3 mmHg LVOT Vmax:         58.70 cm/s LVOT Vmean:        40.500 cm/s LVOT VTI:          0.120 m LVOT/AV VTI ratio: 0.14  AORTA Ao Root diam: 3.60 cm MR Peak grad: 144.2 mmHg  TRICUSPID VALVE MR Mean grad: 85.7 mmHg   TR Peak grad:   54.2 mmHg MR Vmax:      600.33 cm/s TR Vmax:        368.00 cm/s MR Vmean:     430.3 cm/s                           SHUNTS                           Systemic VTI:  0.12 m                           Systemic Diam: 1.80 cm Serafina Royals MD Electronically signed by Serafina Royals MD Signature Date/Time: 06/04/2019/12:33:36 PM    Final      Assessment and Recommendation  86 y.o. male with acute on chronic systolic dysfunction congestive heart failure with elevated BNP and known coronary artery disease status post coronary bypass graft hypertension hyperlipidemia diabetes with elevated troponin consistent with demand ischemia rather than acute coronary syndrome now with very slow improvements over the last few days but continuing to have further urine output and progress 1.  Continue intravenous Lasix for significant acute on chronic systolic dysfunction  congestive heart failure.  Would consider continuing intravenous Lasix as long as patient continues to have slow urine output and improvements of heart failure 2.  No further cardiac diagnostic necessary due to no evidence of myocardial infarction and no apparent ability for aortic valve stenosis treatment surgically 3.  Continue metoprolol for heart rate control of atrial flutter with a goal heart rate between 60 and 80 bpm 4.  Anticoagulation for further risk reduction in stroke with atrial fibrillation with Eliquis unchanged 5.  Begin  ambulation and follow-up for improvements of symptoms and possible adjustments of medications as able after above 6.  Possible home health as well when preparing for discharge and or hospice Signed, Serafina Royals M.D. FACC

## 2019-06-06 NOTE — Progress Notes (Signed)
PROGRESS NOTE    JEKHI MCNIVEN  [id number] DOB: 1928/02/12 DOA: 06/02/2019 PCP: Baxter Hire, MD      Brief Narrative:  Mr. Grzeskowiak is a 86 y.o. M with permanent A. fib status post atrial ablation on Eliquis, dCHF, DM, CAD s/p CABG remote, and HTN who presented with several days worsening dyspnea as well as severe lower extremity edema.  He noted his home oxygen down to 84% on ambulation.  In the ER he 04/27/1990, at bedtime troponin elevated slightly but stable.  Creatinine 1.2, potassium 5.6.  Bilateral lower extremity duplex negative for DVT, but there is severe swelling and redness of bilateral calves.  EKG showed atrial flutter with normal rate.  Patient was started on IV Lasix and admitted to the hospitalist service.            Assessment & Plan:  Acute on chronic systolic and diastolic CHF Aortic stenosis, moderate to severe Mitral regurgitation moderate to severe Admitted and started on IV Lasix.  Echo on 4/9 showed newly severely decreased EF <20%, as well as aortic stenosis and mitral regurgitation.  Also effusion.     Net negative 1.5L yesterday, K normal, Cr improved.     -Continue Furosemide 80 mg IV twice a day  -Apply Unna boots  -Strict I/Os, daily weights, telemetry  -Daily monitoring renal function -Consult Cardiology -Consult palliative care -Resume metoprolol  -Given patient's advanced age, advanced valve disease, he is not a candidate for advanced heart failure therapies (milrinone, LVAD)    Pleural effusion This was noted on echo -Diuresis as above  Permanent atrial fibrillation status post ablation Rate controlled -Continue Eliquis -Resume metoprolol  Diabetes Glucose well controlled -Continue sliding scale corrections -Hold home Metformin and glimepiride  Coronary disease, secondary prevention Hypertension BP controlled -Hold ramipril -Continue metoprolol -Continue simvastatin    Cellulitis, ruled out No leukocytosis,  fever to suggest infection.  Symptoms bilateral, militates against diagnosis of infection.  Hyperkalemia Lokelma given once on 4/10.  Now resolved with improving kidney function and Lasix.  BPH -Continue finasteride       Disposition: Status is: Inpatient  Remains inpatient appropriate because:Inpatient level of care appropriate due to severity of illness   Dispo: The patient is from: Home              Anticipated d/c is to: SNF              Anticipated d/c date is: Several more days              Patient currently is not medically stable to d/c.   Patient was found to have severely reduced EF from baseline.  Given this and his aortic stenosis, he has a dire prognosis.  If he is able to tolerate further diuresis, he may be able to discharge to home with hospice versus to SNF              MDM: The below labs and imaging reports reviewed and summarized above.  Medication management as above.      DVT prophylaxis: Not applicable, on Eliquis Code Status: Full code Family Communication:     Consultants:   Cardiology  Procedures:   Echo -- severely reduced EF  Antimicrobials:      Culture data:              Subjective: The patient has no complaints today.  He is weak, but his breathing feels about the same.  Swelling is now improved.  No confusion, fever, sputum.      Objective: Vitals:   06/05/19 2253 06/06/19 0455 06/06/19 0849 06/06/19 0910  BP:  111/73 114/67   Pulse:  79 79 82  Resp: (!) 22 20 18    Temp:  98 F (36.7 C)  97.6 F (36.4 C)  TempSrc:  Oral  Oral  SpO2:  96%  98%  Weight:  88.3 kg    Height:        Intake/Output Summary (Last 24 hours) at 06/06/2019 0917 Last data filed at 06/06/2019 0847 Gross per 24 hour  Intake 240 ml  Output 1700 ml  Net -1460 ml   Filed Weights   06/04/19 0422 06/05/19 0426 06/06/19 0455  Weight: 88 kg 89.7 kg 88.3 kg    Examination: General appearance: Frail elderly adult male, in  bed eating breakfast, no obvious distress.  Appears febrile.   HEENT: Anicteric, conjunctiva pink, lids and lashes normal age. No nasal discharge, epistaxis.  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  Lips moist, teeth normal. OP tacky dry, no oral lesions.  Diminished. Skin: Warm and dry.  No suspicious rashes or lesions.  Skin in the legs is red bilaterally, weeping. Cardiac: Regular rate and rhythm, systolic murmur noted, severe bilateral lower extremity edema, minimal improvement from prior.  JVP still elevated.   Respiratory: Normal respiratory rate and rhythm.  Diminished bilaterally, rales at bilateral bases Abdomen: Abdomen soft.  No tenderness palpation or guarding. No ascites, distension, hepatosplenomegaly.   MSK: No deformities or effusions of the large joints of the upper or lower extremities bilaterally.  There is loss of subcutaneous muscle mass and fat Neuro: Awake and alert, but psychomotor slowing is noted, muscle tone is diminished, moves all extremities with symmetric ordination, but severe generalized weakness, speech fluent.  Psych: Sensorium intact and responding to questions, attention blunted. Affect blunted.  Judgment and insight appear moderately impaired by dementia.        Data Reviewed: I have personally reviewed following labs and imaging studies:  CBC: Recent Labs  Lab 06/02/19 2124 06/03/19 0336 06/04/19 0441 06/05/19 0431 06/06/19 0522  WBC 8.0 7.9 8.2 8.1 7.1  NEUTROABS  --  6.0 5.4 5.1 4.7  HGB 14.7 15.1 14.6 13.9 13.9  HCT 45.1 45.6 44.2 43.1 42.3  MCV 89.7 88.4 88.9 91.3 89.1  PLT 224 224 208 229 [id number]   Basic Metabolic Panel: Recent Labs  Lab 06/02/19 2124 06/03/19 0336 06/04/19 0441 06/05/19 0431 06/06/19 0522  NA 138  --  137 135 135  K 5.6*  --  5.8* 5.2* 3.8  CL 104  --  103 100 99  CO2 24  --  26 28 27   GLUCOSE 179*  --  106* 104* 170*  BUN 38*  --  43* 45* 47*  CREATININE 1.21  --  1.32* 1.36* 1.06  CALCIUM 9.5  --  9.1  8.6* 8.6*  MG  --  2.2  --   --   --    GFR: Estimated Creatinine Clearance: 52.9 mL/min (by C-G formula based on SCr of 1.06 mg/dL). Liver Function Tests: Recent Labs  Lab 06/02/19 2124  AST 37  ALT 36  ALKPHOS 153*  BILITOT 1.2  PROT 6.5  ALBUMIN 3.1*   No results for input(s): LIPASE, AMYLASE in the last 168 hours. No results for input(s): AMMONIA in the last 168 hours. Coagulation Profile: No results for input(s): INR, PROTIME in the last 168 hours. Cardiac Enzymes: No results for input(s):  CKTOTAL, CKMB, CKMBINDEX, TROPONINI in the last 168 hours. BNP (last 3 results) No results for input(s): PROBNP in the last 8760 hours. HbA1C: No results for input(s): HGBA1C in the last 72 hours. CBG: Recent Labs  Lab 06/05/19 0801 06/05/19 1136 06/05/19 1643 06/05/19 2109 06/06/19 0754  GLUCAP 104* 207* 170* 204* 147*   Lipid Profile: No results for input(s): CHOL, HDL, LDLCALC, TRIG, CHOLHDL, LDLDIRECT in the last 72 hours. Thyroid Function Tests: No results for input(s): TSH, T4TOTAL, FREET4, T3FREE, THYROIDAB in the last 72 hours. Anemia Panel: No results for input(s): VITAMINB12, FOLATE, FERRITIN, TIBC, IRON, RETICCTPCT in the last 72 hours. Urine analysis:    Component Value Date/Time   COLORURINE YELLOW (A) 07/14/2015 1035   APPEARANCEUR CLEAR (A) 07/14/2015 1035   LABSPEC 1.026 07/14/2015 1035   PHURINE 5.0 07/14/2015 1035   GLUCOSEU >500 (A) 07/14/2015 1035   HGBUR 1+ (A) 07/14/2015 1035   BILIRUBINUR NEGATIVE 07/14/2015 1035   KETONESUR 1+ (A) 07/14/2015 1035   PROTEINUR 30 (A) 07/14/2015 1035   NITRITE NEGATIVE 07/14/2015 1035   LEUKOCYTESUR NEGATIVE 07/14/2015 1035   Sepsis Labs: @LABRCNTIP (procalcitonin:4,lacticacidven:4)  ) Recent Results (from the past 240 hour(s))  Respiratory Panel by RT PCR (Flu A&B, Covid) - Nasopharyngeal Swab     Status: None   Collection Time: 06/03/19  1:34 AM   Specimen: Nasopharyngeal Swab  Result Value Ref Range Status    SARS Coronavirus 2 by RT PCR NEGATIVE NEGATIVE Final    Comment: (NOTE) SARS-CoV-2 target nucleic acids are NOT DETECTED. The SARS-CoV-2 RNA is generally detectable in upper respiratoy specimens during the acute phase of infection. The lowest concentration of SARS-CoV-2 viral copies this assay can detect is 131 copies/mL. A negative result does not preclude SARS-Cov-2 infection and should not be used as the sole basis for treatment or other patient management decisions. A negative result may occur with  improper specimen collection/handling, submission of specimen other than nasopharyngeal swab, presence of viral mutation(s) within the areas targeted by this assay, and inadequate number of viral copies (<131 copies/mL). A negative result must be combined with clinical observations, patient history, and epidemiological information. The expected result is Negative. Fact Sheet for Patients:  PinkCheek.be Fact Sheet for Healthcare Providers:  GravelBags.it This test is not yet ap proved or cleared by the [place] FDA and  has been authorized for detection and/or diagnosis of SARS-CoV-2 by FDA under an Emergency Use Authorization (EUA). This EUA will remain  in effect (meaning this test can be used) for the duration of the COVID-19 declaration under Section 564(b)(1) of the Act, 21 U.S.C. section 360bbb-3(b)(1), unless the authorization is terminated or revoked sooner.    Influenza A by PCR NEGATIVE NEGATIVE Final   Influenza B by PCR NEGATIVE NEGATIVE Final    Comment: (NOTE) The Xpert Xpress SARS-CoV-2/FLU/RSV assay is intended as an aid in  the diagnosis of influenza from Nasopharyngeal swab specimens and  should not be used as a sole basis for treatment. Nasal washings and  aspirates are unacceptable for Xpert Xpress SARS-CoV-2/FLU/RSV  testing. Fact Sheet for Patients: PinkCheek.be Fact  Sheet for Healthcare Providers: GravelBags.it This test is not yet approved or cleared by the [place] FDA and  has been authorized for detection and/or diagnosis of SARS-CoV-2 by  FDA under an Emergency Use Authorization (EUA). This EUA will remain  in effect (meaning this test can be used) for the duration of the  Covid-19 declaration under Section 564(b)(1) of the Act, 21  U.S.C. section 360bbb-3(b)(1), unless the authorization is  terminated or revoked. Performed at [facility], [address], [place], [place] [address]          Radiology Studies: DG Chest [place] 1 View  Result Date: 06/04/2019 CLINICAL DATA:  Aspiration EXAM: PORTABLE CHEST 1 VIEW COMPARISON:  06/02/2019 FINDINGS: Post CABG changes. Stable heart size. Atherosclerotic calcification of the aortic knob. Low lung volumes. Pulmonary vascular congestion with interstitial prominence, similar to prior. Probable small bilateral pleural effusions. No pneumothorax. IMPRESSION: Unchanged appearance of the chest with suggestion of CHF with mild edema. Electronically Signed   By: Davina Poke D.O.   On: 06/04/2019 15:23        Scheduled Meds: . apixaban  5 mg Oral BID  . aspirin EC  81 mg Oral BID  . collagenase   Topical QODAY  . feeding supplement (NEPRO CARB STEADY)  237 mL Oral BID BM  . finasteride  5 mg Oral Daily  . furosemide  80 mg Intravenous BID  . insulin aspart  0-9 Units Subcutaneous TID PC & HS  . mouth rinse  15 mL Mouth Rinse BID  . simvastatin  20 mg Oral QHS  . sodium chloride flush  3 mL Intravenous Q12H   Continuous Infusions: . sodium chloride       LOS: 3 days    Time spent: 25 minutes     Edwin Dada, MD [facility] 06/06/2019, 9:17 AM     Please page though [place] or Epic secure chat:  For [employer], Adult nurse

## 2019-06-06 NOTE — Progress Notes (Signed)
9 beat run Vtach.. Asymptomatic. Will monitor.

## 2019-06-06 NOTE — Evaluation (Addendum)
Clinical/Bedside Swallow Evaluation Patient Details  Name: Joseph Flowers MRN: [id number] Date of Birth: 07-16-1927  Today's Date: 06/06/2019 Time: SLP Start Time (ACUTE ONLY): 1015 SLP Stop Time (ACUTE ONLY): 1115 SLP Time Calculation (min) (ACUTE ONLY): 60 min  Past Medical History:  Past Medical History:  Diagnosis Date  . Atrial fibrillation ([place])    a. first diagnosed 06/2015; b. CHADS2VASc => 6 (CHF, HTN, age x 2, DM, vascualr disease)  . Basal cell carcinoma ([facility])    left leg and nose  . 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 Coronary artery disease involving native heart without angina pectoris 2002   s/p CABG x 5 (Dr. Roxy Manns  . Diabetes mellitus without complication ([place])   . Diastolic CHF ([place]) [id number]  . HOH (hard of hearing)   . Hyperlipemia   . Hypertension   . Myocardial infarction [facility])    per daughter Donita Brooks  . S/P CABG x 5 2002   Dr. Roxy Manns  . Wears glasses    Past Surgical History:  Past Surgical History:  Procedure Laterality Date  . BASAL CELL CARCINOMA EXCISION N/A 07/12/2018   Procedure: Excision of Basal Cell Carcinoma of nose;  Surgeon: Wallace Going, DO;  Location: [place];  Service: Plastics;  Laterality: N/A;  . CORONARY ARTERY BYPASS GRAFT    . ENDOSCOPIC RETROGRADE CHOLANGIOPANCREATOGRAPHY (ERCP) WITH PROPOFOL N/A 07/16/2015   Procedure: ENDOSCOPIC RETROGRADE CHOLANGIOPANCREATOGRAPHY (ERCP) WITH PROPOFOL;  Surgeon: Lucilla Lame, MD;  Location: [facility] ENDOSCOPY;  Service: Endoscopy;  Laterality: N/A;  . EXCISION MASS LOWER EXTREMETIES Left 07/12/2018   Procedure: Excision of Basal Cell Carcinoma of left leg with Acell Placement;  Surgeon: Wallace Going, DO;  Location: [place];  Service: Plastics;  Laterality: Left;   HPI:  Pt is a 86 y.o. Caucasian male with a known history of atrial ablation, diastolic CHF, type diabetes mellitus, coronary artery disease status post CABG, hypertension and dyslipidemia, who presented to the emergency room  with acute onset of worsening dyspnea as well as lower extremity edema.  The patient has been having dyspnea on exertion.  He denies any significant cough or wheezing. He was noted to have low pulse oximetry that was down to 84% on ambulation on room air.    Assessment / Plan / Recommendation Clinical Impression  Pt appears to present w/ grossly adequate oropharyngeal phase swallowing function w/ no gross oropharyngeal phase dysphagia appreciated; no neuromuscular swallowing deficits. Pt is at reduced risk for aspiration following general aspiration precautions. Pt has a baseline of of what he describes as Esophageal Dysmotility - "things feel caught in my throat"(as he pointed to his sternal notch area). Any Dysmotility of foods/liquids in the Esophagus can increase risk for aspiration of the Reflux material during backflow thus impact Pulmonary status. Pt was slouched down in the bed, so much support and education was given on the use of Pillows Low behind the back to support more upright; also encouraged sitting in the chair if possible. After positioning fully, pt consumed trials of thin liquids via Cup, purees and softened solids w/ no consistent, overt clinical s/s of aspiration noted; clear vocal quality b/t trials. Min, immediate cough noted x1 w/ trials of thin liquids when pt stated he felt the food trial prior "caught" in his throat. No further coughing or s/s of aspiration noted w/ subsequent trials following general precautions. Educated pt on taking small, single sips; using moist foods after dry foods to aid pharyngeal-esophageal clearing.  Oral phase appeared [facility] . Of note, w/ ALL trial consistencies, pt exhibited min increased WOB/respiratory effort post trials -- Rest Breaks were given b/t trials to calm the breathing b/f taking further po's. Pt has SOB at Baseline d/t underlying pulmonary issues per chart notes. OM exam was [facility] for oral clearing; lingual/labial movements. No unilateral  weakness. Recommend a more mech soft diet for the cut meats, moistened foods(for conservation of energy) w/ thin liquids; general aspiration precautions including no straws.ALSO, use of applesauce to swallow Pills for safer swallowing. Rest Breaks during meals/oral intake to lessen SOB/WOB during oral intake.Disucssion and handouts given on precautions. Recommend Dietician consult for support. NSG to reconsult ST services if any new needs while admitted.  SLP Visit Diagnosis: Dysphagia, pharyngoesophageal phase (R13.14)    Aspiration Risk  Mild aspiration risk;Risk for inadequate nutrition/hydration(REDUCED FOLLOWING PRECAUTIONS)    Diet Recommendation  Mech Soft diet(easy to chew) w/ thin liquids; gravies to moisten foods well. General aspiration precautions. Support at meals -- all for conservation of energy.   Medication Administration: Whole meds with puree(for safer swallowing)    Other  Recommendations Recommended Consults: (Dietician f/u) Oral Care Recommendations: Oral care BID;Oral care before and after PO;Staff/trained caregiver to provide oral care Other Recommendations: (n/a)   Follow up Recommendations None      Frequency and Duration (n/a)  (n/a)       Prognosis Prognosis for Safe Diet Advancement: Fair(-Good) Barriers to Reach Goals: Time post onset;Severity of deficits(pulmonary status)      Swallow Study   General Date of Onset: 06/02/19 HPI: Pt is a 86 y.o. Caucasian male with a known history of atrial ablation, diastolic CHF, type diabetes mellitus, coronary artery disease status post CABG, hypertension and dyslipidemia, who presented to the emergency room with acute onset of worsening dyspnea as well as lower extremity edema.  The patient has been having dyspnea on exertion.  He denies any significant cough or wheezing. He was noted to have low pulse oximetry that was down to 84% on ambulation on room air.  Type of Study: Bedside Swallow Evaluation Previous  Swallow Assessment: none reported Diet Prior to this Study: Regular;Thin liquids Temperature Spikes Noted: No(wbc 7.1) Respiratory Status: Room air History of Recent Intubation: No Behavior/Cognition: Alert;Cooperative;Pleasant mood;Distractible;Requires cueing(min HOH) Oral Cavity Assessment: Dry Oral Care Completed by SLP: Yes Oral Cavity - Dentition: Adequate natural dentition Vision: Functional for self-feeding Self-Feeding Abilities: Able to feed self;Needs assist;Needs set up Patient Positioning: Upright in bed(needed full positioning) Baseline Vocal Quality: Normal;Low vocal intensity Volitional Cough: Strong Volitional Swallow: Able to elicit    Oral/Motor/Sensory Function Overall Oral Motor/Sensory Function: Within functional limits   Ice Chips Ice chips: Within functional limits Presentation: Spoon(fed; 2 trials)   Thin Liquid Thin Liquid: Impaired Presentation: Cup;Self Fed(7 trials) Oral Phase Impairments: (none) Oral Phase Functional Implications: (none) Pharyngeal  Phase Impairments: Cough - Immediate(x1/7 trials) Other Comments: min increased WOB w/ any exertion/po tasks requiring frequent rest breaks to allow for calming of breathing    Nectar Thick Nectar Thick Liquid: Not tested   Honey Thick Honey Thick Liquid: Not tested   Puree Puree: Within functional limits Presentation: Spoon(fed; 3 trials)   Solid     Solid: Within functional limits Presentation: Spoon(fed: 3 trials)       Orinda Kenner, MS, CCC-SLP Malesha Suliman 06/06/2019,1:31 PM

## 2019-06-06 NOTE — TOC Initial Note (Addendum)
Transition of Care [facility]) - Initial/Assessment Note    Patient Details  Name: Joseph Flowers MRN: [id number] Date of Birth: 04-15-1927  Transition of Care [facility]) CM/SW Contact:    Eileen Stanford, LCSW Phone Number: 06/06/2019, 10:19 AM  Clinical Narrative:    CSW spoke with pt and pt's son at bedside. Pt's son states pt lives home alone. Pt's son states he had spoken to the doctor about hospice level of care. However, if pt does improve pt and pt's son are agreeable to SNF. The preference is [facility]. CSW will continue to follow pt's progression.      1.  Do you have a scale? Yes 2.  Do you weigh yourself daily? Yes 3.  Two pounds over night or 5 in one week call your doctor because it could be fluid overload?Aware  4.  Do you go to the Heart Failure Clinic? Pt has a cardiologist  5. An appointment with heart failure clinic will be made before discharge if. 6. If patient is home bound, home health can be arranged with an agency that provides a heart failure protocol.        Expected Discharge Plan: Skilled Nursing Facility Barriers to Discharge: Continued Medical Work up   Patient Goals and CMS Choice        Expected Discharge Plan and Services Expected Discharge Plan: [place] In-house Referral: Clinical Social Work   Post Acute Care Choice: [place] Living arrangements for the past 2 months: [place]                                      Prior Living Arrangements/Services Living arrangements for the past 2 months: Single Family Home Lives with:: Self Patient language and need for interpreter reviewed:: Yes Do you feel safe going back to the place where you live?: Yes      Need for Family Participation in Patient Care: Yes (Comment) Care giver support system in place?: Yes (comment)   Criminal Activity/Legal Involvement Pertinent to Current Situation/Hospitalization: No - Comment as needed  Activities of Daily  Living Home Assistive Devices/Equipment: Cane (specify quad or straight) ADL Screening (condition at time of admission) Patient's cognitive ability adequate to safely complete daily activities?: Yes Is the patient deaf or have difficulty hearing?: Yes Does the patient have difficulty seeing, even when wearing glasses/contacts?: No Does the patient have difficulty concentrating, remembering, or making decisions?: No Patient able to express need for assistance with ADLs?: Yes Does the patient have difficulty dressing or bathing?: No Independently performs ADLs?: Yes (appropriate for developmental age) Does the patient have difficulty walking or climbing stairs?: Yes Weakness of Legs: Both Weakness of Arms/Hands: Both  Permission Sought/Granted Permission sought to share information with : Family Supports    Share Information with NAME: [name]  Permission granted to share info w AGENCY: Conservation officer, nature granted to share info w Relationship: son     Emotional Assessment Appearance:: Appears stated age Attitude/Demeanor/Rapport: Other (comment)(confused) Affect (typically observed): Flat, Quiet Orientation: : Oriented to Self, Oriented to Place, Oriented to Situation, Oriented to  Time Alcohol / Substance Use: Not Applicable Psych Involvement: No (comment)  Admission diagnosis:  Hyperkalemia [E87.5] Acute CHF (congestive heart failure) (HCC) 0000000 Acute diastolic congestive heart failure (HCC) [I50.31] Acute respiratory failure with hypoxia ([place]) [J96.01] AKI (acute kidney injury) ([place]) [N17.9] Demand ischemia of myocardium ([place]) [I24.8] Acute on  chronic congestive heart failure, unspecified heart failure type [facility]) [I50.9] Patient Active Problem List   Diagnosis Date Noted  . Acute CHF (congestive heart failure) ([place]) 06/03/2019  . Basal cell carcinoma of face 06/15/2018  . Basal cell carcinoma of leg 06/15/2018  . Lymphedema 11/02/2016  . Chronic venous insufficiency  11/02/2016  . PAD (peripheral artery disease) ([place]) 09/25/2016  . Leg pain 09/25/2016  . 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 Benign fibroma of prostate 08/02/2015  . Controlled type 2 diabetes mellitus without complication ([place]) 123456  . H/O renal calculi 08/02/2015  . HLD (hyperlipidemia) 08/02/2015  . BP (high blood pressure) 08/02/2015  . Adult hypothyroidism 08/02/2015  . Myocardial infarction ([place]) 08/02/2015  . SOB (shortness of breath)   . Atrial fibrillation ([place])   . Coronary artery disease involving native heart without angina pectoris   . S/P CABG x 5   . Diastolic CHF ([place])   . Postpyloric ulcer   . Esophagitis, unspecified   . Acquired hypertrophic pyloric stenosis   . Cholangitis 07/14/2015  . Calculus of gallbladder with biliary obstruction but without cholecystitis   . H/O neoplasm 06/25/2015  . Bowel obstruction ([place])   . SBO (small bowel obstruction) ([place])   . Intestinal obstruction due to adhesions ([place]) 08/18/2014  . CAD in native artery 03/22/2014  . Difficult or painful urination 02/02/2012  . Incomplete bladder emptying 02/02/2012  . Flu vaccine need 02/02/2012  . Excessive urination at night 02/02/2012  . Benign prostatic hyperplasia with urinary obstruction 02/02/2012  . Symptoms involving urinary system 02/02/2012   PCP:  Baxter Hire, MD Pharmacy:   [facility] [address]), [place]) [place] [address] Phone: [phone] Fax: [phone]     Social Determinants of Health (SDOH) Interventions    Readmission Risk Interventions No flowsheet data found.

## 2019-06-07 ENCOUNTER — Encounter: Payer: Self-pay | Admitting: Family Medicine

## 2019-06-07 DIAGNOSIS — Z515 Encounter for palliative care: Secondary | ICD-10-CM

## 2019-06-07 DIAGNOSIS — I5031 Acute diastolic (congestive) heart failure: Secondary | ICD-10-CM | POA: Diagnosis not present

## 2019-06-07 DIAGNOSIS — Z7189 Other specified counseling: Secondary | ICD-10-CM

## 2019-06-07 DIAGNOSIS — I509 Heart failure, unspecified: Secondary | ICD-10-CM | POA: Diagnosis not present

## 2019-06-07 LAB — BASIC METABOLIC PANEL
Anion gap: 10 (ref 5–15)
BUN: 44 mg/dL — ABNORMAL HIGH (ref 8–23)
CO2: 30 mmol/L (ref 22–32)
Calcium: 8.6 mg/dL — ABNORMAL LOW (ref 8.9–10.3)
Chloride: 99 mmol/L (ref 98–111)
Creatinine, Ser: 1 mg/dL (ref 0.61–1.24)
GFR calc Af Amer: >60 mL/min (ref >60)
GFR calc non Af Amer: >60 mL/min (ref >60)
Glucose, Bld: 141 mg/dL — ABNORMAL HIGH (ref 70–99)
Potassium: 3.5 mmol/L (ref 3.5–5.1)
Sodium: 139 mmol/L (ref 135–145)

## 2019-06-07 LAB — CBC WITH DIFFERENTIAL/PLATELET
Abs Immature Granulocytes: 0.02 10*3/uL (ref 0.00–0.07)
Basophils Absolute: 0.1 10*3/uL (ref 0.0–0.1)
Basophils Relative: 1 %
Eosinophils Absolute: 0.7 10*3/uL — ABNORMAL HIGH (ref 0.0–0.5)
Eosinophils Relative: 10 %
HCT: 42.7 % (ref 39.0–52.0)
Hemoglobin: 14.4 g/dL (ref 13.0–17.0)
Immature Granulocytes: 0 %
Lymphocytes Relative: 16 %
Lymphs Abs: 1.2 10*3/uL (ref 0.7–4.0)
MCH: 29.6 pg (ref 26.0–34.0)
MCHC: 33.7 g/dL (ref 30.0–36.0)
MCV: 87.7 fL (ref 80.0–100.0)
Monocytes Absolute: 0.5 10*3/uL (ref 0.1–1.0)
Monocytes Relative: 7 %
Neutro Abs: 5 10*3/uL (ref 1.7–7.7)
Neutrophils Relative %: 66 %
Platelets: 229 10*3/uL (ref 150–400)
RBC: 4.87 MIL/uL (ref 4.22–5.81)
RDW: 14.6 % (ref 11.5–15.5)
WBC: 7.6 10*3/uL (ref 4.0–10.5)
nRBC: 0 % (ref 0.0–0.2)

## 2019-06-07 LAB — GLUCOSE, CAPILLARY
Glucose-Capillary: 142 mg/dL — ABNORMAL HIGH (ref 70–99)
Glucose-Capillary: 201 mg/dL — ABNORMAL HIGH (ref 70–99)
Glucose-Capillary: 180 mg/dL — ABNORMAL HIGH (ref 70–99)
Glucose-Capillary: 307 mg/dL — ABNORMAL HIGH (ref 70–99)

## 2019-06-07 NOTE — Consult Note (Signed)
Consultation Note Date: 06/07/2019   Patient Name: Joseph Flowers  DOB: 21-Mar-1927  MRN: [id number]  Age / Sex: 86 y.o., male  PCP: Joseph Hire, MD Referring Physician: Deatra James, MD  Reason for Consultation: Establishing goals of care and Psychosocial/spiritual support  HPI/Patient Profile: 86 y.o. male  with past medical history of A. fib, diastolic heart failure, diabetes 2, CAD, status post CABG, HTN/HLD admitted on 06/02/2019 with acute on chronic heart failure.   Clinical Assessment and Goals of Care: I have reviewed medical records including EPIC notes, labs and imaging, received report from attending, examined the patient and met at bedside with son, Joseph Flowers to discuss diagnosis prognosis, [place], EOL wishes, disposition and options.  I introduced Palliative Medicine as specialized medical care for people living with serious illness. It focuses on providing relief from the symptoms and stress of a serious illness.   We discussed a brief life review of the patient.  Joseph Flowers tells me that he is a former Company secretary, he worked for the [employer].  He had open heart surgery about 20 years ago.  He is a widower and lives alone.  As far as functional and nutritional status, Joseph Flowers has been driving himself recently, although I am not sure that this is safe.  He has been living alone, mostly independent with IADL's.   We discussed current illness and what it means in the larger context of on-going co-morbidities.  Natural disease trajectory and expectations at EOL were discussed.  Joseph Flowers does not have strength to go to rehab, and rehab is not in his goals.    The difference between aggressive medical intervention and comfort care was considered in light of the patient's goals of care.   We talked about chronic illness burden related to heart failure, just like I will get sick again, Mr.  Flowers will get sick again also.  I ask him to consider preferred place of death.  He tells me that he has not thought of this.  His son at bedside states that Joseph Flowers adult children have talked about choices for their father.  Advanced directives, concepts specific to code status, artifical feeding and hydration, and rehospitalization were considered and discussed.  We talk about code status and although Joseph Flowers seems to consider, family endorses DNR.    Hospice services were explained and offered.  In detail about what hospice can and cannot provide.  I shared that Joseph Flowers is not a candidate for residential hospice at this time, a person must have a life expectancy of 2 weeks or less.  His son states understanding. We talked about hospice at home, service providers come, do their task, and then leave.  Family would be expected to be available for 24/7 care due to Joseph Flowers's frailty.   We talked about private pay at SNF, $8-$10,000 per month upfront, with hospice services at facility.  We talked about transition to residential hospice when appropriate. We talked about service provider options.  Mr.  Flowers does have a long-term care policy.  I encourage his son to open a claim as soon as possible.  Joseph Flowers son tells me that he would like to discuss choices with his other family members.  Questions and concerns were addressed.  The family was encouraged to call with questions or concerns.  Phone number left.  Return to Joseph Flowers's room, family not present  Joseph Flowers is working with PT and doing very well.   Call to son [name], left detailed message sharing PT eval.   At this point Joseph Flowers does NOT need 24/7 care, and would benefit from home with treat the treatable hospice care.  Discussed with bedside nursing staff.    HCPOA    HCPOA - son Joseph Flowers. Is at bedside. He tells me that his sister is POA.     SUMMARY OF RECOMMENDATIONS   Considering home with Hospice  VS Private  pay LTC facility with hospice Transition to residential hospice when appropriate.   Code Status/Advance Care Planning:  DNR  Symptom Management:   Per hospitalist, no additional needs at this time.   Palliative Prophylaxis:   Frequent Pain Assessment and Oral Care  Additional Recommendations (Limitations, Scope, Preferences):  Treat the treatable but no CPR or intubation  Psycho-social/Spiritual:   Desire for further Chaplaincy support:no  Additional Recommendations: Caregiving  Support/Resources and Education on Hospice  Prognosis:   < 3 months, would not be surprising based on frailty, poor functional status, chronic illness burden.  Would easily qualify for at home hospice with transition to residential hospice but declined  Discharge Planning: To be determined, based on family choice and ability.      Primary Diagnoses: Present on Admission: **None**   I have reviewed the medical record, interviewed the patient and family, and examined the patient. The following aspects are pertinent.  Past Medical History:  Diagnosis Date  . Atrial fibrillation ([place])    a. first diagnosed 06/2015; b. CHADS2VASc => 6 (CHF, HTN, age x 2, DM, vascualr disease)  . Basal cell carcinoma (BCC)    left leg and nose  . 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 Coronary artery disease involving native heart without angina pectoris 2002   s/p CABG x 5 (Dr. Roxy Manns  . Diabetes mellitus without complication ([place])   . Diastolic CHF ([place]) 6433  . HOH (hard of hearing)   . Hyperlipemia   . Hypertension   . Myocardial infarction [facility])    per daughter Joseph Flowers  . S/P CABG x 5 2002   Dr. Roxy Manns  . Wears glasses    Social History   Socioeconomic History  . Marital status: Widowed    Spouse name: Not on file  . Number of children: Not on file  . Years of education: Not on file  . Highest education level: Not on file  Occupational History  . Not on file  Tobacco Use  . Smoking  status: Never Smoker  . Smokeless tobacco: Never Used  Substance and Sexual Activity  . Alcohol use: No    Alcohol/week: 0.0 standard drinks  . Drug use: No  . Sexual activity: Not on file  Other Topics Concern  . Not on file  Social History Narrative  . Not on file   Social Determinants of Health   Financial Resource Strain:   . Difficulty of Paying Living Expenses:   Food Insecurity:   . Worried About Charity fundraiser in the  Last Year:   . [place] in the Last Year:   Transportation Needs:   . Film/video editor (Medical):   Marland Kitchen Lack of Transportation (Non-Medical):   Physical Activity:   . Days of Exercise per Week:   . Minutes of Exercise per Session:   Stress:   . Feeling of Stress :   Social Connections:   . Frequency of Communication with Friends and Family:   . Frequency of Social Gatherings with Friends and Family:   . Attends Religious Services:   . Active Member of Clubs or Organizations:   . Attends Archivist Meetings:   Marland Kitchen Marital Status:    Family History  Problem Relation Age of Onset  . Cancer Mother   . Cancer Father    Scheduled Meds: . apixaban  5 mg Oral BID  . aspirin EC  81 mg Oral BID  . collagenase   Topical QODAY  . feeding supplement (NEPRO CARB STEADY)  237 mL Oral BID BM  . finasteride  5 mg Oral Daily  . furosemide  80 mg Intravenous BID  . insulin aspart  0-9 Units Subcutaneous TID PC & HS  . mouth rinse  15 mL Mouth Rinse BID  . metoprolol succinate  50 mg Oral Daily  . simvastatin  20 mg Oral QHS  . sodium chloride flush  3 mL Intravenous Q12H   Continuous Infusions: . sodium chloride     PRN Meds:.sodium chloride, acetaminophen, albuterol, ondansetron (ZOFRAN) IV, sodium chloride flush Medications Prior to Admission:  Prior to Admission medications   Medication Sig Start Date End Date Taking? Authorizing Provider  aspirin EC 81 MG tablet Take 81 mg by mouth 2 (two) times daily.   Yes [provider]  finasteride (PROSCAR) 5 MG tablet Take 5 mg by mouth daily.  06/12/14  Yes [provider]  furosemide (LASIX) 20 MG tablet Take 20 mg by mouth daily.  08/11/14  Yes [provider]  glimepiride (AMARYL) 2 MG tablet Take 2 mg by mouth daily with breakfast.  07/19/16  Yes [provider]  glimepiride (AMARYL) 4 MG tablet Take 4 mg by mouth daily with breakfast. 06/04/15  Yes [provider]  metFORMIN (GLUCOPHAGE) 500 MG tablet Take 500 mg by mouth daily with breakfast.  08/11/14  Yes [provider]  metoprolol succinate (TOPROL-XL) 50 MG 24 hr tablet Take 1 tablet (50 mg total) by mouth 2 (two) times daily. Take with or immediately following a meal. Patient taking differently: Take 50 mg by mouth daily. Take with or immediately following a meal. 07/20/15  Yes [name], MD  ramipril (ALTACE) 10 MG capsule Take 10 mg by mouth daily.  08/11/14  Yes [provider]  SANTYL ointment APPLY TO LEFT LEG EVERY OTHER DAY 10/23/18  Yes [provider]  simvastatin (ZOCOR) 20 MG tablet Take 20 mg by mouth at bedtime.  08/11/14  Yes [provider]   No Known Allergies Review of Systems  Unable to perform ROS: Age    Physical Exam Vitals and nursing note reviewed.  Constitutional:      General: He is not in acute distress.    Appearance: He is ill-appearing.  Cardiovascular:     Rate and Rhythm: Normal rate.  Pulmonary:     Effort: Pulmonary effort is normal. No respiratory distress.  Abdominal:     General: Abdomen is flat.  Musculoskeletal:        General: No  swelling.  Skin:    General: Skin is warm and dry.  Neurological:     General: No focal deficit present.     Mental Status: He is alert.  Psychiatric:        Mood and Affect: Mood normal.        Behavior: Behavior normal.     Vital Signs: BP 127/72 (BP Location: Left Arm)   Pulse 86   Temp (!) 97.4 F (36.3 C) (Oral)   Resp 18   Ht 5'  10" (1.778 m)   Wt 87.6 kg   SpO2 95%   BMI 27.71 kg/m  Pain Scale: 0-10   Pain Score: 0-No pain   SpO2: SpO2: 95 % O2 Device:SpO2: 95 % O2 Flow Rate: .O2 Flow Rate (L/min): 2 L/min  IO: Intake/output summary:   Intake/Output Summary (Last 24 hours) at 06/07/2019 1428 Last data filed at 06/07/2019 1330 Gross per 24 hour  Intake 720 ml  Output 1500 ml  Net -780 ml    LBM: Last BM Date: 06/06/19 Baseline Weight: Weight: 83.5 kg Most recent weight: Weight: 87.6 kg     Palliative Assessment/Data:   Flowsheet Rows     Most Recent Value  Intake Tab  Referral Department  Hospitalist  Unit at Time of Referral  Med/Surg Unit  Palliative Care Primary Diagnosis  Cardiac  Date Notified  06/04/19  Palliative Care Type  New Palliative care  Reason for referral  Clarify Goals of Care  Date of Admission  06/02/19  Date first seen by Palliative Care  06/07/19  # of days Palliative referral response time  3 Day(s)  # of days IP prior to Palliative referral  2  Clinical Assessment  Palliative Performance Scale Score  30%  Pain Max last 24 hours  Not able to report  Pain Min Last 24 hours  Not able to report  Dyspnea Max Last 24 Hours  Not able to report  Dyspnea Min Last 24 hours  Not able to report  Psychosocial & Spiritual Assessment  Palliative Care Outcomes      Time In: 1110 Time Out: 1220 Time Total: 70 minutes Greater than 50%  of this time was spent counseling and coordinating care related to the above assessment and plan.  Signed by: Drue Novel, NP   Please contact Palliative Medicine Team phone at [phone] for questions and concerns.  For individual provider: See Shea Evans

## 2019-06-07 NOTE — Progress Notes (Signed)
Nutrition Follow Up Note   DOCUMENTATION CODES:   Not applicable  INTERVENTION:   Provide Nepro Shake po BID, each supplement provides 425 kcal and 19 grams protein.  NUTRITION DIAGNOSIS:   Inadequate oral intake related to decreased appetite as evidenced by meal completion < 50%.  GOAL:   Patient will meet greater than or equal to 90% of their needs  -progressing   MONITOR:   PO intake, Supplement acceptance, Labs, Weight trends, I & O's  ASSESSMENT:   86 year old male with PMHx of DM, HTN, HLD, hx lymphoma, CHF, CAD s/p CABG x5 2002, A-fib admitted with acute CHF, bilateral lower extremity cellulitis, and hyperkalemia.   Pt with improved appetite and oral intake; pt ate 100% of his breakfast this morning and 70% of his lunch. Pt is drinking some Nepro supplements. Per chart, pt is weight stable since admit. Palliative care following for GOC.   Medications reviewed and include: aspirin, lasix, insulin  Labs reviewed: BUN 44(H) cbgs- 142, 201 x 24 hrs  Diet Order:   Diet Order            Diet heart healthy/carb modified Room service appropriate? Yes with Assist; Fluid consistency: Thin; Fluid restriction: 1500 mL Fluid  Diet effective now             EDUCATION NEEDS:   Not appropriate for education at this time(Patient with poor PO intake and at risk for malnutrition.)  Skin:  Skin Assessment: (skin assessment not completed at this time)  Last BM:  4/13- type 4  Height:   Ht Readings from Last 1 Encounters:  06/03/19 5\' 10"  (1.778 m)   Weight:   Wt Readings from Last 1 Encounters:  06/07/19 87.6 kg   BMI:  Body mass index is 27.71 kg/m.  Estimated Nutritional Needs:   Kcal:  1900-2100  Protein:  100-110 grams  Fluid:  per MD  Koleen Distance MS, RD, LDN Please refer to [facility] for RD and/or RD on-call/weekend/after hours pager

## 2019-06-07 NOTE — Plan of Care (Signed)
  Problem: Health Behavior/Discharge Planning: Goal: Ability to manage health-related needs will improve Outcome: Progressing   Problem: Clinical Measurements: Goal: Ability to maintain clinical measurements within normal limits will improve Outcome: Progressing   Problem: Clinical Measurements: Goal: Will remain free from infection Outcome: Progressing   

## 2019-06-07 NOTE — Progress Notes (Signed)
[facility] Encounter Note  Patient: Joseph Flowers / Admit Date: 06/02/2019 / Date of Encounter: 06/07/2019, 8:25 AM   Subjective: Patient is slightly improved from admission slight improvements in lower extremity edema and PND orthopnea.  After review of ins and outs it does appear that the patient continues to have daily output Echocardiogram showing severe LV systolic dysfunction with ejection fraction of 20% with moderate aortic stenosis moderate to severe mitral regurgitation appears to be significantly changed from before and likely the primary cause of current admission Overall patient has relatively poor prognosis long-term with multiple issues above Review of Systems: Positive for: Shortness of breath edema Negative for: Vision change, hearing change, syncope, dizziness, nausea, vomiting,diarrhea, bloody stool, stomach pain, cough, congestion, diaphoresis, urinary frequency, urinary pain,skin lesions, skin rashes Others previously listed  Objective: Telemetry: Atrial flutter with bundle branch block Physical Exam: Blood pressure 121/76, pulse 80, temperature (!) 97.3 F (36.3 C), temperature source Oral, resp. rate 19, height 5\' 10"  (1.778 m), weight 87.6 kg, SpO2 98 %. Body mass index is 27.71 kg/m. General: Well developed, well nourished, in no acute distress. Head: Normocephalic, atraumatic, sclera non-icteric, no xanthomas, nares are without discharge. Neck: No apparent masses Lungs: Normal respirations with few to wheezes, no rhonchi, Xolair basilar rales , no crackles   Heart: Regular rate and rhythm, normal S1 S2, no 3+ aortic murmur, no rub, no gallop, PMI is normal size and placement, carotid upstroke normal without bruit, jugular venous pressure normal Abdomen: Soft, non-tender, non-distended with normoactive bowel sounds. No hepatosplenomegaly. Abdominal aorta is normal size without bruit Extremities: 2+ edema, no clubbing, no cyanosis, positive  ulcers,  Peripheral: 2+ radial, 2+ femoral, 2+ dorsal pedal pulses Neuro: Alert and oriented. Moves all extremities spontaneously. Psych:  Responds to questions appropriately with a normal affect.   Intake/Output Summary (Last 24 hours) at 06/07/2019 0825 Last data filed at 06/07/2019 0532 Gross per 24 hour  Intake 240 ml  Output 1775 ml  Net -1535 ml    Inpatient Medications:  . apixaban  5 mg Oral BID  . aspirin EC  81 mg Oral BID  . collagenase   Topical QODAY  . feeding supplement (NEPRO CARB STEADY)  237 mL Oral BID BM  . finasteride  5 mg Oral Daily  . furosemide  80 mg Intravenous BID  . insulin aspart  0-9 Units Subcutaneous TID PC & HS  . mouth rinse  15 mL Mouth Rinse BID  . metoprolol succinate  50 mg Oral Daily  . simvastatin  20 mg Oral QHS  . sodium chloride flush  3 mL Intravenous Q12H   Infusions:  . sodium chloride      Labs: Recent Labs    06/06/19 0522 06/07/19 0355  NA 135 139  K 3.8 3.5  CL 99 99  CO2 27 30  GLUCOSE 170* 141*  BUN 47* 44*  CREATININE 1.06 1.00  CALCIUM 8.6* 8.6*   No results for input(s): AST, ALT, ALKPHOS, BILITOT, PROT, ALBUMIN in the last 72 hours. Recent Labs    06/06/19 0522 06/07/19 0355  WBC 7.1 7.6  NEUTROABS 4.7 5.0  HGB 13.9 14.4  HCT 42.3 42.7  MCV 89.1 87.7  PLT 211 229   No results for input(s): CKTOTAL, CKMB, TROPONINI in the last 72 hours. Invalid input(s): POCBNP No results for input(s): HGBA1C in the last 72 hours.   Weights: Filed Weights   06/05/19 0426 06/06/19 0455 06/07/19 0530  Weight: 89.7 kg 88.3  kg 87.6 kg     Radiology/Studies:  DG Chest 2 View  Result Date: 06/02/2019 CLINICAL DATA:  86 year old male with shortness of breath EXAM: CHEST - 2 VIEW COMPARISON:  Chest radiograph dated 07/18/2015. FINDINGS: There is mild cardiomegaly with vascular congestion and probable trace bilateral pleural effusions. Pneumonia is not excluded. Clinical correlation is recommended. Median sternotomy  wires and CABG vascular clips noted. Atherosclerotic calcification of the aortic arch. Osteopenia with degenerative changes of the spine. No acute osseous pathology. IMPRESSION: Mild cardiomegaly with findings of CHF. Pneumonia is not excluded. Electronically Signed   By: Anner Crete M.D.   On: 06/02/2019 21:03   US Venous Img Lower Bilateral  Result Date: 06/02/2019 CLINICAL DATA:  Initial evaluation for acute lower extremities swelling for 3 weeks EXAM: BILATERAL LOWER EXTREMITY VENOUS DOPPLER ULTRASOUND TECHNIQUE: Gray-scale sonography with graded compression, as well as color Doppler and duplex ultrasound were performed to evaluate the lower extremity deep venous systems from the level of the common femoral vein and including the common femoral, femoral, profunda femoral, popliteal and calf veins including the posterior tibial, peroneal and gastrocnemius veins when visible. The superficial great saphenous vein was also interrogated. Spectral Doppler was utilized to evaluate flow at rest and with distal augmentation maneuvers in the common femoral, femoral and popliteal veins. COMPARISON:  None. FINDINGS: RIGHT LOWER EXTREMITY Common Femoral Vein: No evidence of thrombus. Normal compressibility, respiratory phasicity and response to augmentation. Saphenofemoral Junction: No evidence of thrombus. Normal compressibility and flow on color Doppler imaging. Profunda Femoral Vein: No evidence of thrombus. Normal compressibility and flow on color Doppler imaging. Femoral Vein: No evidence of thrombus. Normal compressibility, respiratory phasicity and response to augmentation. Popliteal Vein: No evidence of thrombus. Normal compressibility, respiratory phasicity and response to augmentation. Calf Veins: Veins of the calf are not well visualized. No obvious thrombus or other abnormality. Superficial Great Saphenous Vein: No evidence of thrombus. Normal compressibility. Venous Reflux:  None. Other Findings: Diffuse  soft tissue/interstitial edema seen throughout the subcutaneous soft tissues of the lower leg/calf. LEFT LOWER EXTREMITY Common Femoral Vein: No evidence of thrombus. Normal compressibility, respiratory phasicity and response to augmentation. Saphenofemoral Junction: No evidence of thrombus. Normal compressibility and flow on color Doppler imaging. Profunda Femoral Vein: No evidence of thrombus. Normal compressibility and flow on color Doppler imaging. Femoral Vein: No evidence of thrombus. Normal compressibility, respiratory phasicity and response to augmentation. Popliteal Vein: No evidence of thrombus. Normal compressibility, respiratory phasicity and response to augmentation. Calf Veins: Veins of the calf are not well visualized. No obvious thrombus or other abnormality. Superficial Great Saphenous Vein: No evidence of thrombus. Normal compressibility. Venous Reflux:  None. Other Findings: Diffuse soft tissue/interstitial edema seen throughout the subcutaneous soft tissues of the lower leg/calf. IMPRESSION: 1. No evidence of deep venous thrombosis in either lower extremity. 2. Diffuse soft tissue/interstitial edema within the lower legs/calves bilaterally. Electronically Signed   By: Jeannine Boga M.D.   On: 06/02/2019 22:44   DG Chest Port 1 View  Result Date: 06/04/2019 CLINICAL DATA:  Aspiration EXAM: PORTABLE CHEST 1 VIEW COMPARISON:  06/02/2019 FINDINGS: Post CABG changes. Stable heart size. Atherosclerotic calcification of the aortic knob. Low lung volumes. Pulmonary vascular congestion with interstitial prominence, similar to prior. Probable small bilateral pleural effusions. No pneumothorax. IMPRESSION: Unchanged appearance of the chest with suggestion of CHF with mild edema. Electronically Signed   By: Davina Poke D.O.   On: 06/04/2019 15:23   ECHOCARDIOGRAM COMPLETE  Result Date: 06/04/2019  ECHOCARDIOGRAM REPORT   Patient Name:   Joseph Flowers Date of Exam: 06/04/2019 Medical  Rec #:  [id number]       Height:       70.0 in Accession #:    [id number]      Weight:       194.0 lb Date of Birth:  03-26-1927       BSA:          2.060 m Patient Age:    86 years        BP:           109/73 mmHg Patient Gender: M               HR:           94 bpm. Exam Location:  [facility] Procedure: 2D Echo Indications:     CHF-ACUTE DIASTOLIC 0000000  History:         Patient has prior history of Echocardiogram examinations, most                  recent 07/18/2015. CHF, CAD, Prior CABG,                  Signs/Symptoms:Shortness of Breath; Risk Factors:Diabetes.  Sonographer:     Avanell Shackleton Referring Phys:  [id number] [place] Diagnosing Phys: Serafina Royals MD  Sonographer Comments: I ATTEMPTED PEDOFF IN ALL VIEWS IMPRESSIONS  1. Left ventricular ejection fraction, by estimation, is <20%. The left ventricle has severely decreased function. The left ventricle demonstrates global hypokinesis. The left ventricular internal cavity size was moderately dilated. There is mild left ventricular hypertrophy. Left ventricular diastolic parameters were normal.  2. Right ventricular systolic function is normal. The right ventricular size is normal. There is moderately elevated pulmonary artery systolic pressure.  3. Left atrial size was moderately dilated.  4. Right atrial size was mildly dilated.  5. Large pleural effusion in the left lateral region.  6. The mitral valve is normal in structure. Moderate to severe mitral valve regurgitation.  7. Tricuspid valve regurgitation is moderate to severe.  8. The aortic valve is normal in structure. Aortic valve regurgitation is trivial. Moderate to severe aortic valve stenosis. FINDINGS  Left Ventricle: Left ventricular ejection fraction, by estimation, is <20%. The left ventricle has severely decreased function. The left ventricle demonstrates global hypokinesis. The left ventricular internal cavity size was moderately dilated. There is mild left ventricular hypertrophy. Left  ventricular diastolic parameters were normal. Right Ventricle: The right ventricular size is normal. No increase in right ventricular wall thickness. Right ventricular systolic function is normal. There is moderately elevated pulmonary artery systolic pressure. The tricuspid regurgitant velocity is 3.68 m/s, and with an assumed right atrial pressure of 10 mmHg, the estimated right ventricular systolic pressure is [id number]. Left Atrium: Left atrial size was moderately dilated. Right Atrium: Right atrial size was mildly dilated. Pericardium: There is no evidence of pericardial effusion. Mitral Valve: The mitral valve is normal in structure. Moderate to severe mitral valve regurgitation. Tricuspid Valve: The tricuspid valve is normal in structure. Tricuspid valve regurgitation is moderate to severe. Aortic Valve: The aortic valve is normal in structure. Aortic valve regurgitation is trivial. Moderate to severe aortic stenosis is present. Aortic valve mean gradient measures 43.3 mmHg. Aortic valve peak gradient measures 64.1 mmHg. Aortic valve area, by VTI measures 0.36 cm. Pulmonic Valve: The pulmonic valve was normal in structure. Pulmonic valve regurgitation is mild. Aorta: The aortic root and ascending  aorta are structurally normal, with no evidence of dilitation. IAS/Shunts: No atrial level shunt detected by color flow Doppler. Additional Comments: There is a large pleural effusion in the left lateral region.  LEFT VENTRICLE PLAX 2D LVIDd:         4.92 cm LVIDs:         4.28 cm LV PW:         0.94 cm LV IVS:        1.04 cm LVOT diam:     1.80 cm LV SV:         31 LV SV Index:   15 LVOT Area:     2.54 cm  LV Volumes (MOD) LV vol d, MOD A2C: 171.0 ml LV vol d, MOD A4C: 132.0 ml LV vol s, MOD A2C: 133.0 ml LV vol s, MOD A4C: 91.3 ml LV SV MOD A2C:     38.0 ml LV SV MOD A4C:     132.0 ml LV SV MOD BP:      38.2 ml IVC IVC diam: 2.53 cm LEFT ATRIUM             Index LA diam:        4.30 cm 2.09 cm/m LA Vol (A2C):    81.5 ml 39.55 ml/m LA Vol (A4C):   63.4 ml 30.77 ml/m LA Biplane Vol: 73.5 ml 35.67 ml/m  AORTIC VALVE AV Area (Vmax):    0.37 cm AV Area (Vmean):   0.33 cm AV Area (VTI):     0.36 cm AV Vmax:           400.33 cm/s AV Vmean:          314.667 cm/s AV VTI:            0.837 m AV Peak Grad:      64.1 mmHg AV Mean Grad:      43.3 mmHg LVOT Vmax:         58.70 cm/s LVOT Vmean:        40.500 cm/s LVOT VTI:          0.120 m LVOT/AV VTI ratio: 0.14  AORTA Ao Root diam: 3.60 cm MR Peak grad: 144.2 mmHg  TRICUSPID VALVE MR Mean grad: 85.7 mmHg   TR Peak grad:   54.2 mmHg MR Vmax:      600.33 cm/s TR Vmax:        368.00 cm/s MR Vmean:     430.3 cm/s                           SHUNTS                           Systemic VTI:  0.12 m                           Systemic Diam: 1.80 cm Serafina Royals MD Electronically signed by Serafina Royals MD Signature Date/Time: 06/04/2019/12:33:36 PM    Final      Assessment and Recommendation  86 y.o. male with acute on chronic systolic dysfunction congestive heart failure with elevated BNP and known coronary artery disease status post coronary bypass graft hypertension hyperlipidemia diabetes with elevated troponin consistent with demand ischemia rather than acute coronary syndrome now with very slow improvements over the last few days but continuing to have further urine output and progress 1.  Continue intravenous  Lasix for significant acute on chronic systolic dysfunction congestive heart failure.  Would consider continuing intravenous Lasix as long as patient continues to have slow urine output and improvements of heart failure 2.  No further cardiac diagnostic necessary due to no evidence of myocardial infarction and no apparent ability for aortic valve stenosis treatment surgically 3.  Continue metoprolol for heart rate control of atrial flutter with a goal heart rate between 60 and 80 bpm 4.  Anticoagulation for further risk reduction in stroke with atrial fibrillation with  Eliquis unchanged 5.  Begin ambulation and follow-up for improvements of symptoms and possible adjustments of medications as able after above 6.  Possible home health as well when preparing for discharge and or hospice Signed, Serafina Royals M.D. FACC

## 2019-06-07 NOTE — Progress Notes (Signed)
Physical Therapy Treatment Patient Details Name: Joseph Flowers MRN: [id number] DOB: 1928/01/24 Today's Date: 06/07/2019    History of Present Illness per MD note:Joseph Flowers  is a 86 y.o. Caucasian male with a known history of atrial ablation, diastolic CHF, type diabetes mellitus, coronary artery disease status post CABG, hypertension and dyslipidemia, who presented to the emergency room with acute onset of worsening dyspnea as well as lower extremity edema.  The patient has been having dyspnea on exertion.  He denies any significant cough or wheezing. He was noted to have low pulse oximetry that was down to 84% on ambulation on room air    PT Comments    Pt in bed upon entry. Tactile cues for bed level exercises. Supervision to modI for bed mobility to EOB. MinA and elevated surface to rise from EOB c RW. Pt educated on elevating seated surfaces at home to allow for improved independence with transfers at DC. Pt AMB 184ft around unit on RA, 95% SpO2, low level of exertion. After sitting break, pt then assisted to AMB of 38ft with chair follow, however pt never needs to pause for a recovery interval. Pt reports feeling about 50% back to PLOF, however authro suspects AMB capcaity is closer to 75%. Largest impairment currently is difficulty coming to standing which can be mediated with BSC over toilet at home and simple modifications to furniture in home. Family support recommended for meals/transport/groceries.     Follow Up Recommendations  Home health PT;Supervision - Intermittent     Equipment Recommendations  3in1 (PT);Other (comment)(3-in-1 for toilet riser function; pt has difficulty rising from low surfaces)    Recommendations for Other Services       Precautions / Restrictions Precautions Precautions: Fall Restrictions Weight Bearing Restrictions: No    Mobility  Bed Mobility Overal bed mobility: Needs Assistance Bed Mobility: Supine to Sit     Supine to sit: Modified  independent (Device/Increase time)        Transfers Overall transfer level: Needs assistance Equipment used: Rolling walker (2 wheeled) Transfers: Sit to/from Stand Sit to Stand: Min assist;From elevated surface         General transfer comment: unable to rise from standard surface with RW  Ambulation/Gait Ambulation/Gait assistance: Supervision Gait Distance (Feet): 320 Feet(127ft around b-pod; then 353ft  around 2A) Assistive device: Rolling walker (2 wheeled)   Gait velocity: 0.21m/s   General Gait Details: consistent cadence and pace, no SOB, appears steady and safe.   Stairs             Wheelchair Mobility    Modified Rankin (Stroke Patients Only)       Balance                                            Cognition Arousal/Alertness: Awake/alert Behavior During Therapy: WFL for tasks assessed/performed Overall Cognitive Status: Within Functional Limits for tasks assessed                                        Exercises General Exercises - Lower Extremity Short Arc Quad: AROM;Both;10 reps;Supine Heel Slides: AAROM;Both;15 reps;Supine    General Comments        Pertinent Vitals/Pain Pain Assessment: No/denies pain    Home Living  Prior Function            PT Goals (current goals can now be found in the care plan section) Acute Rehab PT Goals Patient Stated Goal: to get up PT Goal Formulation: All assessment and education complete, DC therapy Time For Goal Achievement: 06/19/19 Potential to Achieve Goals: Good Progress towards PT goals: Progressing toward goals    Frequency    Min 2X/week      PT Plan Discharge plan needs to be updated;Equipment recommendations need to be updated    Co-evaluation              AM-PAC PT "6 Clicks" Mobility   Outcome Measure  Help needed turning from your back to your side while in a flat bed without using bedrails?: A  Little Help needed moving from lying on your back to sitting on the side of a flat bed without using bedrails?: A Little Help needed moving to and from a bed to a chair (including a wheelchair)?: A Little Help needed standing up from a chair using your arms (e.g., wheelchair or bedside chair)?: A Little Help needed to walk in hospital room?: A Little Help needed climbing 3-5 steps with a railing? : A Little 6 Click Score: 18    End of Session Equipment Utilized During Treatment: Gait belt Activity Tolerance: Patient tolerated treatment well Patient left: in chair;with chair alarm set;with call bell/phone within reach Nurse Communication: Mobility status PT Visit Diagnosis: Unsteadiness on feet (R26.81);Repeated falls (R29.6);Muscle weakness (generalized) (M62.81);History of falling (Z91.81);Difficulty in walking, not elsewhere classified (R26.2)     Time: [id number] PT Time Calculation (min) (ACUTE ONLY): 38 min  Charges:  $Therapeutic Exercise: 38-52 mins                     4:00 PM, 06/07/19 Joseph Flowers, PT, DPT Physical Therapist - [facility]  [phone] ([place])    Eldridge C 06/07/2019, 3:55 PM

## 2019-06-07 NOTE — Progress Notes (Signed)
PROGRESS NOTE    Joseph Flowers  [id number] DOB: 11-07-1927 DOA: 06/02/2019 PCP: Baxter Hire, MD      Subjective: The patient was seen and examined this morning, stable awake alert, in good spirit.  Complain of generalized weaknesses.  But stating that he is willing to work with PT at home.  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  PT OT reporting that he was hypotensive 2-week to work with today.    -------------------------------------------------------------------------------------------------------------------------------- Brief Narrative:  Joseph Flowers is a 86 y.o. M with permanent A. fib status post atrial ablation on Eliquis, dCHF, DM, CAD s/p CABG remote, and HTN who presented with several days worsening dyspnea as well as severe lower extremity edema.  He noted his home oxygen down to 84% on ambulation.  In the ER he 04/27/1990, at bedtime troponin elevated slightly but stable.  Creatinine 1.2, potassium 5.6.  Bilateral lower extremity duplex negative for DVT, but there is severe swelling and redness of bilateral calves.  EKG showed atrial flutter with normal rate.  Patient was started on IV Lasix and admitted to the hospitalist service.   --------------------------------------------------------------------------------------------------------------------------------- Assessment & Plan:  Acute on chronic systolic and diastolic CHF Aortic stenosis, moderate to severe Mitral regurgitation moderate to severe -Denies any shortness of breath, off oxygen, -Cardiology following closely he remains on IV Lasix  -  Echo on 4/9 showed newly severely decreased EF <20%, as well as aortic stenosis and mitral regurgitation.  Also effusion.     Net negative 800cc, K trending down.  Cr stable.  -Continue Furosemide 80 mg IV twice a day  -Strict I/Os, daily weights, telemetry  -Daily monitoring renal function -Cardiology following -recommending continuing IV  diuretics as long as patient has low urine output... Recommending no intervention -Consult palliative care -Metoprolol was on hold, cardiology has restarted metoprolol with a heart rate goal of 60-80 bpm -Given patient's advanced age, advanced valve disease, he is not a candidate for advanced heart failure therapies (milrinone, LVAD)    Pleural effusion Will attempt diuresis  Permanent atrial fibrillation status post ablation Rate controlled -Continue Eliquis -Presumed metoprolol  Diabetes Glucose controlled -Continue sliding scale corrections -Hold home Metformin and glimepiride  Coronary disease, secondary prevention Hypertension BP soft --- improving -Hold home ramipril -Continue home simvastatin  Cellulitis, ruled out No leukocytosis, fever to suggest infection.  Symptoms bilateral, militates against diagnosis of infection.  Hyperkalemia Lokelma given once on 4/10 -Continue IV Lasix  BPH -Continue finasteride    Severe debility -Unable to ambulate without assist -PT/OT consulted for evaluation recommendation    Disposition: Status is: Inpatient  Remains inpatient appropriate because:Inpatient level of care appropriate due to severity of illness   Dispo: The patient is from: Home              Anticipated d/c is to: SNF              Anticipated d/c date is: > 3 days              Patient currently is not medically stable to d/c.   Patient appears to be failing diuretic therapy, will consult Palliative Care.  If unable to tolerate further diuresis, may involve Hospice.     MDM: The below labs and imaging reports reviewed and summarized above.  Medication management as above. This is a severe exacerbation of his chronic disease.     DVT prophylaxis: Not applicable, on Eliquis Code Status: Full code Family  Communication: daughter and sons at bedside    Consultants:   Cardiology  Procedures:   Echo -- severely reduced EF  Antimicrobials:       Culture data:           Objective: Vitals:   06/06/19 2033 06/07/19 0530 06/07/19 0720 06/07/19 1102  BP: 122/74 131/88 121/76 127/72  Pulse: 90 79 80 86  Resp:   19 18  Temp: 97.7 F (36.5 C) (!) 97.5 F (36.4 C) (!) 97.3 F (36.3 C) (!) 97.4 F (36.3 C)  TempSrc: Oral Oral Oral Oral  SpO2: 96% 94% 98% 95%  Weight:  87.6 kg    Height:        Intake/Output Summary (Last 24 hours) at 06/07/2019 1450 Last data filed at 06/07/2019 1330 Gross per 24 hour  Intake 720 ml  Output 1500 ml  Net -780 ml   Filed Weights   06/05/19 0426 06/06/19 0455 06/07/19 0530  Weight: 89.7 kg 88.3 kg 87.6 kg    Physical Exam  Constitution:  Alert, cooperative, no distress,  Psychiatric: Normal and stable mood and affect, cognition intact,   HEENT: Normocephalic, PERRL, otherwise with in Normal limits  Chest:Chest symmetric Cardio vascular:  S1/S2, RRR, No murmure, No Rubs or Gallops  pulmonary: Clear to auscultation bilaterally, respirations unlabored, negative wheezes / crackles Abdomen: Soft, non-tender, non-distended, bowel sounds,no masses, no organomegaly Muscular skeletal: Severe generalized weaknesses, Limited exam - in bed, able to move all 4 extremities, Normal strength,  Neuro: CNII-XII intact. , normal motor and sensation, reflexes intact  Extremities: No pitting edema lower extremities, +2 pulses  Skin: Dry, warm to touch, negative for any Rashes, No open wounds Wounds: -Multiple scabbed wounds at the scalp, facial area extremities for any open wounds or draining          Data Reviewed: I have personally reviewed following labs and imaging studies:  CBC: Recent Labs  Lab 06/03/19 0336 06/04/19 0441 06/05/19 0431 06/06/19 0522 06/07/19 0355  WBC 7.9 8.2 8.1 7.1 7.6  NEUTROABS 6.0 5.4 5.1 4.7 5.0  HGB 15.1 14.6 13.9 13.9 14.4  HCT 45.6 44.2 43.1 42.3 42.7  MCV 88.4 88.9 91.3 89.1 87.7  PLT 224 208 229 211 [id number]   Basic Metabolic Panel: Recent Labs  Lab  06/02/19 2124 06/03/19 0336 06/04/19 0441 06/05/19 0431 06/06/19 0522 06/07/19 0355  NA 138  --  137 135 135 139  K 5.6*  --  5.8* 5.2* 3.8 3.5  CL 104  --  103 100 99 99  CO2 24  --  26 28 27 30   GLUCOSE 179*  --  106* 104* 170* 141*  BUN 38*  --  43* 45* 47* 44*  CREATININE 1.21  --  1.32* 1.36* 1.06 1.00  CALCIUM 9.5  --  9.1 8.6* 8.6* 8.6*  MG  --  2.2  --   --   --   --    GFR: Estimated Creatinine Clearance: 55.8 mL/min (by C-G formula based on SCr of 1 mg/dL). Liver Function Tests: Recent Labs  Lab 06/02/19 2124  AST 37  ALT 36  ALKPHOS 153*  BILITOT 1.2  PROT 6.5  ALBUMIN 3.1*  CBG: Recent Labs  Lab 06/06/19 1128 06/06/19 1656 06/06/19 2103 06/07/19 0722 06/07/19 1104  GLUCAP 178* 195* 204* 142* 201*  Urine analysis:    Component Value Date/Time   COLORURINE YELLOW (A) 07/14/2015 1035   APPEARANCEUR CLEAR (A) 07/14/2015 1035   LABSPEC 1.026 07/14/2015 1035  PHURINE 5.0 07/14/2015 1035   GLUCOSEU >500 (A) 07/14/2015 1035   HGBUR 1+ (A) 07/14/2015 1035   BILIRUBINUR NEGATIVE 07/14/2015 1035   KETONESUR 1+ (A) 07/14/2015 1035   PROTEINUR 30 (A) 07/14/2015 1035   NITRITE NEGATIVE 07/14/2015 1035   LEUKOCYTESUR NEGATIVE 07/14/2015 1035   Sepsis Labs: @LABRCNTIP (procalcitonin:4,lacticacidven:4)  ) Recent Results (from the past 240 hour(s))  Respiratory Panel by RT PCR (Flu A&B, Covid) - Nasopharyngeal Swab     Status: None   Collection Time: 06/03/19  1:34 AM   Specimen: Nasopharyngeal Swab  Result Value Ref Range Status   SARS Coronavirus 2 by RT PCR NEGATIVE NEGATIVE Final    Comment: (NOTE) SARS-CoV-2 target nucleic acids are NOT DETECTED. The SARS-CoV-2 RNA is generally detectable in upper respiratoy specimens during the acute phase of infection. The lowest concentration of SARS-CoV-2 viral copies this assay can detect is 131 copies/mL. A negative result does not preclude SARS-Cov-2 infection and should not be used as the sole basis for  treatment or other patient management decisions. A negative result may occur with  improper specimen collection/handling, submission of specimen other than nasopharyngeal swab, presence of viral mutation(s) within the areas targeted by this assay, and inadequate number of viral copies (<131 copies/mL). A negative result must be combined with clinical observations, patient history, and epidemiological information. The expected result is Negative. Fact Sheet for Patients:  PinkCheek.be Fact Sheet for Healthcare Providers:  GravelBags.it This test is not yet ap proved or cleared by the [place] FDA and  has been authorized for detection and/or diagnosis of SARS-CoV-2 by FDA under an Emergency Use Authorization (EUA). This EUA will remain  in effect (meaning this test can be used) for the duration of the COVID-19 declaration under Section 564(b)(1) of the Act, 21 U.S.C. section 360bbb-3(b)(1), unless the authorization is terminated or revoked sooner.    Influenza A by PCR NEGATIVE NEGATIVE Final   Influenza B by PCR NEGATIVE NEGATIVE Final    Comment: (NOTE) The Xpert Xpress SARS-CoV-2/FLU/RSV assay is intended as an aid in  the diagnosis of influenza from Nasopharyngeal swab specimens and  should not be used as a sole basis for treatment. Nasal washings and  aspirates are unacceptable for Xpert Xpress SARS-CoV-2/FLU/RSV  testing. Fact Sheet for Patients: PinkCheek.be Fact Sheet for Healthcare Providers: GravelBags.it This test is not yet approved or cleared by the [place] FDA and  has been authorized for detection and/or diagnosis of SARS-CoV-2 by  FDA under an Emergency Use Authorization (EUA). This EUA will remain  in effect (meaning this test can be used) for the duration of the  Covid-19 declaration under Section 564(b)(1) of the Act, 21  U.S.C. section  360bbb-3(b)(1), unless the authorization is  terminated or revoked. Performed at [facility], [address] [place], [place] [address]          Radiology Studies: No results found.      Scheduled Meds: . apixaban  5 mg Oral BID  . aspirin EC  81 mg Oral BID  . collagenase   Topical QODAY  . feeding supplement (NEPRO CARB STEADY)  237 mL Oral BID BM  . finasteride  5 mg Oral Daily  . furosemide  80 mg Intravenous BID  . insulin aspart  0-9 Units Subcutaneous TID PC & HS  . mouth rinse  15 mL Mouth Rinse BID  . metoprolol succinate  50 mg Oral Daily  . simvastatin  20 mg Oral QHS  . sodium chloride  flush  3 mL Intravenous Q12H   Continuous Infusions: . sodium chloride       LOS: 4 days    Time spent: 35 minutes     Deatra James, MD [facility] 06/07/2019, 2:50 PM     Please page though [place] or Epic secure chat:  For [employer], Adult nurse

## 2019-06-08 ENCOUNTER — Encounter: Payer: Self-pay | Admitting: Family Medicine

## 2019-06-08 DIAGNOSIS — Z515 Encounter for palliative care: Secondary | ICD-10-CM | POA: Diagnosis not present

## 2019-06-08 DIAGNOSIS — Z7189 Other specified counseling: Secondary | ICD-10-CM | POA: Diagnosis not present

## 2019-06-08 DIAGNOSIS — I5031 Acute diastolic (congestive) heart failure: Secondary | ICD-10-CM | POA: Diagnosis not present

## 2019-06-08 DIAGNOSIS — I509 Heart failure, unspecified: Secondary | ICD-10-CM | POA: Diagnosis not present

## 2019-06-08 LAB — GLUCOSE, CAPILLARY
Glucose-Capillary: 93 mg/dL (ref 70–99)
Glucose-Capillary: 222 mg/dL — ABNORMAL HIGH (ref 70–99)
Glucose-Capillary: 247 mg/dL — ABNORMAL HIGH (ref 70–99)
Glucose-Capillary: 192 mg/dL — ABNORMAL HIGH (ref 70–99)

## 2019-06-08 LAB — CBC WITH DIFFERENTIAL/PLATELET
Abs Immature Granulocytes: 0.02 10*3/uL (ref 0.00–0.07)
Basophils Absolute: 0.1 10*3/uL (ref 0.0–0.1)
Basophils Relative: 1 %
Eosinophils Absolute: 0.9 10*3/uL — ABNORMAL HIGH (ref 0.0–0.5)
Eosinophils Relative: 12 %
HCT: 41.2 % (ref 39.0–52.0)
Hemoglobin: 14.1 g/dL (ref 13.0–17.0)
Immature Granulocytes: 0 %
Lymphocytes Relative: 18 %
Lymphs Abs: 1.4 10*3/uL (ref 0.7–4.0)
MCH: 29.9 pg (ref 26.0–34.0)
MCHC: 34.2 g/dL (ref 30.0–36.0)
MCV: 87.5 fL (ref 80.0–100.0)
Monocytes Absolute: 0.6 10*3/uL (ref 0.1–1.0)
Monocytes Relative: 8 %
Neutro Abs: 4.9 10*3/uL (ref 1.7–7.7)
Neutrophils Relative %: 61 %
Platelets: 225 10*3/uL (ref 150–400)
RBC: 4.71 MIL/uL (ref 4.22–5.81)
RDW: 14.6 % (ref 11.5–15.5)
WBC: 7.9 10*3/uL (ref 4.0–10.5)
nRBC: 0 % (ref 0.0–0.2)

## 2019-06-08 LAB — BASIC METABOLIC PANEL
Anion gap: 10 (ref 5–15)
BUN: 41 mg/dL — ABNORMAL HIGH (ref 8–23)
CO2: 32 mmol/L (ref 22–32)
Calcium: 8.3 mg/dL — ABNORMAL LOW (ref 8.9–10.3)
Chloride: 97 mmol/L — ABNORMAL LOW (ref 98–111)
Creatinine, Ser: 0.95 mg/dL (ref 0.61–1.24)
GFR calc Af Amer: >60 mL/min (ref >60)
GFR calc non Af Amer: >60 mL/min (ref >60)
Glucose, Bld: 95 mg/dL (ref 70–99)
Potassium: 3.4 mmol/L — ABNORMAL LOW (ref 3.5–5.1)
Sodium: 139 mmol/L (ref 135–145)

## 2019-06-08 MED ORDER — POTASSIUM CHLORIDE CRYS ER 20 MEQ PO TBCR
40.0000 meq | EXTENDED_RELEASE_TABLET | Freq: Once | ORAL | Status: AC
  Administered 2019-06-08: 40 meq via ORAL
  Filled 2019-06-08: qty 2

## 2019-06-08 NOTE — Plan of Care (Signed)
  Problem: Clinical Measurements: Goal: Will remain free from infection Outcome: Progressing   Problem: Clinical Measurements: Goal: Respiratory complications will improve Outcome: Progressing   

## 2019-06-08 NOTE — Progress Notes (Signed)
PROGRESS NOTE    Joseph Flowers  [id number] DOB: 20-Apr-1927 DOA: 06/02/2019 PCP: Baxter Hire, MD      Subjective: The patient was seen and examined this morning, no overnight issues.  Patient is feeling improvement in the lower extremity edema, shortness of breath has been improved significantly. Still with patient's family at bedside, who is planning to take him home with hospice services    -------------------------------------------------------------------------------------------------------------------------------- Brief Narrative:  Joseph Flowers is a 86 y.o. M with permanent A. fib status post atrial ablation on Eliquis, dCHF, DM, CAD s/p CABG remote, and HTN who presented with several days worsening dyspnea as well as severe lower extremity edema.  He noted his home oxygen down to 84% on ambulation.  In the ER he 04/27/1990, at bedtime troponin elevated slightly but stable.  Creatinine 1.2, potassium 5.6.  Bilateral lower extremity duplex negative for DVT, but there is severe swelling and redness of bilateral calves.  EKG showed atrial flutter with normal rate.  Patient was started on IV Lasix and admitted to the hospitalist service.   --------------------------------------------------------------------------------------------------------------------------------- Assessment & Plan:  Acute on chronic systolic and diastolic CHF Aortic stenosis, moderate to severe Mitral regurgitation moderate to severe -Denies any shortness of breath, off oxygen, -Cardiology following closely he remains on IV Lasix  -  Echo on 4/9 showed newly severely decreased EF <20%, as well as aortic stenosis and mitral regurgitation.  Also effusion.     Net negative 690cc, K trending down.  Cr stable.  -Continue Furosemide 80 mg IV twice a day  -Strict I/Os, daily weights, telemetry  -Daily monitoring renal function -Cardiology following -recommending continuing IV diuretics as long as patient has  low urine output... Recommending no intervention -Consult palliative care -Metoprolol was on hold, cardiology has restarted metoprolol with a heart rate goal of 60-80 bpm -Given patient's advanced age, advanced valve disease, he is not a candidate for advanced heart failure therapies (milrinone, LVAD)    Pleural effusion Will continue diuresis  Permanent atrial fibrillation status post ablation Rate controlled -Continue Eliquis -Presumed metoprolol  Diabetes Glucose controlled -Continue sliding scale corrections -Hold home Metformin and glimepiride  Coronary disease, secondary prevention Hypertension BP soft --- improving -Hold home ramipril -Continue home simvastatin  Cellulitis, ruled out No leukocytosis, fever to suggest infection.  Symptoms bilateral, militates against diagnosis of infection.  Hyperkalemia, resolved Lokelma given once on 4/10 -Continue IV Lasix  BPH -Continue finasteride    Severe debility -Unable to ambulate without assist -PT/OT consulted for evaluation recommendation    Disposition: Status is: Inpatient  Remains inpatient appropriate because:Inpatient level of care appropriate due to severity of illness   Dispo: The patient is from: Home              Anticipated d/c is to: Home with hospice services              Anticipated d/c date is: 1 to 2 days, possible tomorrow if all the arrangement for hospice at home has been done              Patient currently is not medically stable to d/c today   Patient appears to be failing diuretic therapy, will consult Palliative Care.  If unable to tolerate further diuresis, may involve Hospice.     MDM: The below labs and imaging reports reviewed and summarized above.  Medication management as above. This is a severe exacerbation of his chronic disease.     DVT prophylaxis: Not applicable, on Eliquis  Code Status: Full code Family Communication: daughter and sons at bedside    Consultants:    Cardiology  Procedures:   Echo -- severely reduced EF  Antimicrobials:      Culture data:           Objective: Vitals:   06/07/19 1921 06/08/19 0318 06/08/19 0728 06/08/19 1101  BP: 132/77 117/77 116/80 112/66  Pulse: 93 78 82 83  Resp: 19 19 17 17   Temp: 97.7 F (36.5 C) 97.6 F (36.4 C) (!) 97.3 F (36.3 C) 97.8 F (36.6 C)  TempSrc:  Oral  Oral  SpO2: 96% 95% 98% 96%  Weight:  85 kg    Height:        Intake/Output Summary (Last 24 hours) at 06/08/2019 1328 Last data filed at 06/08/2019 1102 Gross per 24 hour  Intake 600 ml  Output 1725 ml  Net -1125 ml   Filed Weights   06/06/19 0455 06/07/19 0530 06/08/19 0318  Weight: 88.3 kg 87.6 kg 85 kg    Physical Exam  Constitution:  Alert, cooperative, no distress,  Psychiatric: Normal and stable mood and affect, cognition intact,   HEENT: Normocephalic, PERRL, otherwise with in Normal limits  Chest:Chest symmetric Cardio vascular:  S1/S2, RRR, No murmure, No Rubs or Gallops  pulmonary: Clear to auscultation bilaterally, respirations unlabored, negative wheezes / crackles Abdomen: Soft, non-tender, non-distended, bowel sounds,no masses, no organomegaly Muscular skeletal: Severe generalized weaknesses, Limited exam - in bed, able to move all 4 extremities, Normal strength,  Neuro: CNII-XII intact. , normal motor and sensation, reflexes intact  Extremities: No pitting edema lower extremities, +2 pulses  Skin: Dry, warm to touch, negative for any Rashes, No open wounds Wounds: -Multiple scabbed wounds at the scalp, facial area extremities for any open wounds or draining          Data Reviewed: I have personally reviewed following labs and imaging studies:  CBC: Recent Labs  Lab 06/04/19 0441 06/05/19 0431 06/06/19 0522 06/07/19 0355 06/08/19 0437  WBC 8.2 8.1 7.1 7.6 7.9  NEUTROABS 5.4 5.1 4.7 5.0 4.9  HGB 14.6 13.9 13.9 14.4 14.1  HCT 44.2 43.1 42.3 42.7 41.2  MCV 88.9 91.3 89.1 87.7 87.5   PLT 208 229 211 229 [id number]   Basic Metabolic Panel: Recent Labs  Lab 06/02/19 2124 06/03/19 0336 06/04/19 0441 06/05/19 0431 06/06/19 0522 06/07/19 0355 06/08/19 0437  NA   < >  --  137 135 135 139 139  K   < >  --  5.8* 5.2* 3.8 3.5 3.4*  CL   < >  --  103 100 99 99 97*  CO2   < >  --  26 28 27 30  32  GLUCOSE   < >  --  106* 104* 170* 141* 95  BUN   < >  --  43* 45* 47* 44* 41*  CREATININE   < >  --  1.32* 1.36* 1.06 1.00 0.95  CALCIUM   < >  --  9.1 8.6* 8.6* 8.6* 8.3*  MG  --  2.2  --   --   --   --   --    < > = values in this interval not displayed.   GFR: Estimated Creatinine Clearance: 54.4 mL/min (by C-G formula based on SCr of 0.95 mg/dL). Liver Function Tests: Recent Labs  Lab 06/02/19 2124  AST 37  ALT 36  ALKPHOS 153*  BILITOT 1.2  PROT 6.5  ALBUMIN 3.1*  CBG:  Recent Labs  Lab 06/07/19 1104 06/07/19 1624 06/07/19 2120 06/08/19 0729 06/08/19 1217  GLUCAP 201* 180* 307* 93 222*  Urine analysis:    Component Value Date/Time   COLORURINE YELLOW (A) 07/14/2015 1035   APPEARANCEUR CLEAR (A) 07/14/2015 1035   LABSPEC 1.026 07/14/2015 1035   PHURINE 5.0 07/14/2015 1035   GLUCOSEU >500 (A) 07/14/2015 1035   HGBUR 1+ (A) 07/14/2015 1035   BILIRUBINUR NEGATIVE 07/14/2015 1035   KETONESUR 1+ (A) 07/14/2015 1035   PROTEINUR 30 (A) 07/14/2015 1035   NITRITE NEGATIVE 07/14/2015 1035   LEUKOCYTESUR NEGATIVE 07/14/2015 1035   Sepsis Labs: @LABRCNTIP (procalcitonin:4,lacticacidven:4)  ) Recent Results (from the past 240 hour(s))  Respiratory Panel by RT PCR (Flu A&B, Covid) - Nasopharyngeal Swab     Status: None   Collection Time: 06/03/19  1:34 AM   Specimen: Nasopharyngeal Swab  Result Value Ref Range Status   SARS Coronavirus 2 by RT PCR NEGATIVE NEGATIVE Final    Comment: (NOTE) SARS-CoV-2 target nucleic acids are NOT DETECTED. The SARS-CoV-2 RNA is generally detectable in upper respiratoy specimens during the acute phase of infection. The  lowest concentration of SARS-CoV-2 viral copies this assay can detect is 131 copies/mL. A negative result does not preclude SARS-Cov-2 infection and should not be used as the sole basis for treatment or other patient management decisions. A negative result may occur with  improper specimen collection/handling, submission of specimen other than nasopharyngeal swab, presence of viral mutation(s) within the areas targeted by this assay, and inadequate number of viral copies (<131 copies/mL). A negative result must be combined with clinical observations, patient history, and epidemiological information. The expected result is Negative. Fact Sheet for Patients:  PinkCheek.be Fact Sheet for Healthcare Providers:  GravelBags.it This test is not yet ap proved or cleared by the [place] FDA and  has been authorized for detection and/or diagnosis of SARS-CoV-2 by FDA under an Emergency Use Authorization (EUA). This EUA will remain  in effect (meaning this test can be used) for the duration of the COVID-19 declaration under Section 564(b)(1) of the Act, 21 U.S.C. section 360bbb-3(b)(1), unless the authorization is terminated or revoked sooner.    Influenza A by PCR NEGATIVE NEGATIVE Final   Influenza B by PCR NEGATIVE NEGATIVE Final    Comment: (NOTE) The Xpert Xpress SARS-CoV-2/FLU/RSV assay is intended as an aid in  the diagnosis of influenza from Nasopharyngeal swab specimens and  should not be used as a sole basis for treatment. Nasal washings and  aspirates are unacceptable for Xpert Xpress SARS-CoV-2/FLU/RSV  testing. Fact Sheet for Patients: PinkCheek.be Fact Sheet for Healthcare Providers: GravelBags.it This test is not yet approved or cleared by the [place] FDA and  has been authorized for detection and/or diagnosis of SARS-CoV-2 by  FDA under an Emergency  Use Authorization (EUA). This EUA will remain  in effect (meaning this test can be used) for the duration of the  Covid-19 declaration under Section 564(b)(1) of the Act, 21  U.S.C. section 360bbb-3(b)(1), unless the authorization is  terminated or revoked. Performed at [facility], [address], [place], [place] [address]          Radiology Studies: No results found.      Scheduled Meds: . apixaban  5 mg Oral BID  . aspirin EC  81 mg Oral BID  . collagenase   Topical QODAY  . feeding supplement (NEPRO CARB STEADY)  237 mL Oral BID BM  . finasteride  5 mg Oral  Daily  . furosemide  80 mg Intravenous BID  . insulin aspart  0-9 Units Subcutaneous TID PC & HS  . mouth rinse  15 mL Mouth Rinse BID  . metoprolol succinate  50 mg Oral Daily  . simvastatin  20 mg Oral QHS  . sodium chloride flush  3 mL Intravenous Q12H   Continuous Infusions: . sodium chloride       LOS: 5 days    Time spent: 35 minutes     Val Riles, MD [facility] 06/08/2019, 1:28 PM     Please page though [place] or Epic secure chat:  For [employer], Adult nurse

## 2019-06-08 NOTE — Progress Notes (Signed)
SLP Cancellation Note  Patient Details Name: Joseph Flowers MRN: [id number] DOB: 05/16/1927   Cancelled treatment:       Reason Eval/Treat Not Completed: (chart reviewed; consulted NSG). Pt appears to be tolerating his oral diet adequately (and w/ good appetite per NSG) w/ no reports of overt s/s of aspiration noted. Per chart notes, pt will d/c tomorrow home with Hospice care d/t chronic underlying Cardiac issues, decline. Recommend continue general diet w/ aspiration precautions; Pills in puree for easier swallowing. ST services can be available if any further needs while admitted. NSG agreed.     Orinda Kenner, MS, CCC-SLP Aikeem Lilley 06/08/2019, 3:43 PM

## 2019-06-08 NOTE — TOC Progression Note (Signed)
Transition of Care [facility]) - Progression Note    Patient Details  Name: TOIVO PATHAMMAVONG MRN: [id number] Date of Birth: [birth year]  Transition of [facility] CM/SW Contact  Eileen Stanford, LCSW Phone Number: 06/08/2019, 1:37 PM  Clinical Narrative:   Pt will d/c tomorrow home with hospice. [facility] was notified. [facility] will order equipment for home.    Expected Discharge Plan: [place] Barriers to Discharge: Continued Medical Work up  Expected Discharge Plan and Services Expected Discharge Plan: [place] In-house Referral: Clinical Social Work   Post Acute Care Choice: [place] Living arrangements for the past 2 months: Single Family Home                                       Social Determinants of Health (SDOH) Interventions    Readmission Risk Interventions No flowsheet data found.

## 2019-06-08 NOTE — Progress Notes (Signed)
Palliative:   [name] is sitting up in bed. He greets me making and somewhat keeping eye contact.  He appears chronically ill and frail.  Son Joseph Flowers is at bedside.  We discuss PT session yesterday, and that [name] no longer seems to need 24/7 care.  I share that as time goes, it would be expected that [name] would have increasing needs in the future.  I encouraged patient and family to lean on Hospice provider.   Patient and family want for [name] to go home with [facility] hospice services.  We talk about equipment needs; beds, BSC, shower chair.  Joseph Flowers tells me that he will be available for delivery.    We talk about at home nursing services.  Joseph Flowers tells me that his sister has activated [name]'s LTC policy for in home caregivers. We talk about   Conference/coordination of care with attending, bedside nursing staff, [facility] team related to patient condition GOC, needs, and disposition.   Plan:  Home with [facility] "treat the treatable" Hospice care.     79 minutes    Quinn Axe, NP Palliative Medicine Team Team Phone # [phone] Greater than 50% of this time was spent counseling and coordination care related to the above assessment and plan.

## 2019-06-08 NOTE — Progress Notes (Addendum)
Manufacturing engineer [facility]) Hospital Liaison RN note  Notified by Hildred Laser, LCSW with [facility] team and Quinn Axe, NP with PMT of patient/family request for [facility] services at home after discharge. Hospice eligibility has been confirmed by [facility] physician.  Spoke with patient and son [name] at bedside to initiate education related to hospice philosophy, services and team approach to care. Son verbalized understanding of information given. Per discussion, plan is for discharge likely tomorrow by private car.  DME needs discussed. Son is requesting hospital bed with 1/2 rails, 3 in 1 and shower chair for delivery to the home. Home address has been verified in the chart and is correct. [name] is the contact to arrange for time of delivery.  Please send signed and completed DNR home with patient at the time of discharge.  Patient will need prescriptions for discharge comfort medications.  [place] referral center aware of the above. [facility] information and contact numbers have been given to [place]. Above information shared with Shelton Silvas with [facility] team.  Please call with any hospice related questions or concerns.  Thank you for this referral.  Margaretmary Eddy, BSN, RN [facility] Liaison [phone]

## 2019-06-08 NOTE — Progress Notes (Signed)
[facility] Encounter Note  Patient: Joseph Flowers / Admit Date: 06/02/2019 / Date of Encounter: 06/08/2019, 8:42 AM   Subjective: Patient is slightly improved from admission slight improvements in lower extremity edema and PND orthopnea.  After review of ins and outs it does appear that the patient continues to have daily output Echocardiogram showing severe LV systolic dysfunction with ejection fraction of 20% with moderate aortic stenosis moderate to severe mitral regurgitation appears to be significantly changed from before and likely the primary cause of current admission Overall patient has relatively poor prognosis long-term with multiple issues above Review of Systems: Positive for: Shortness of breath edema Negative for: Vision change, hearing change, syncope, dizziness, nausea, vomiting,diarrhea, bloody stool, stomach pain, cough, congestion, diaphoresis, urinary frequency, urinary pain,skin lesions, skin rashes Others previously listed  Objective: Telemetry: Atrial flutter with bundle branch block Physical Exam: Blood pressure 116/80, pulse 82, temperature (!) 97.3 F (36.3 C), resp. rate 17, height 5\' 10"  (1.778 m), weight 85 kg, SpO2 98 %. Body mass index is 26.87 kg/m. General: Well developed, well nourished, in no acute distress. Head: Normocephalic, atraumatic, sclera non-icteric, no xanthomas, nares are without discharge. Neck: No apparent masses Lungs: Normal respirations with few to wheezes, no rhonchi, Xolair basilar rales , no crackles   Heart: Regular rate and rhythm, normal S1 S2, no 3+ aortic murmur, no rub, no gallop, PMI is normal size and placement, carotid upstroke normal without bruit, jugular venous pressure normal Abdomen: Soft, non-tender, non-distended with normoactive bowel sounds. No hepatosplenomegaly. Abdominal aorta is normal size without bruit Extremities: 2+ edema, no clubbing, no cyanosis, positive ulcers,  Peripheral: 2+  radial, 2+ femoral, 2+ dorsal pedal pulses Neuro: Alert and oriented. Moves all extremities spontaneously. Psych:  Responds to questions appropriately with a normal affect.   Intake/Output Summary (Last 24 hours) at 06/08/2019 0842 Last data filed at 06/08/2019 0321 Gross per 24 hour  Intake 960 ml  Output 1500 ml  Net -540 ml    Inpatient Medications:  . apixaban  5 mg Oral BID  . aspirin EC  81 mg Oral BID  . collagenase   Topical QODAY  . feeding supplement (NEPRO CARB STEADY)  237 mL Oral BID BM  . finasteride  5 mg Oral Daily  . furosemide  80 mg Intravenous BID  . insulin aspart  0-9 Units Subcutaneous TID PC & HS  . mouth rinse  15 mL Mouth Rinse BID  . metoprolol succinate  50 mg Oral Daily  . potassium chloride  40 mEq Oral Once  . simvastatin  20 mg Oral QHS  . sodium chloride flush  3 mL Intravenous Q12H   Infusions:  . sodium chloride      Labs: Recent Labs    06/07/19 0355 06/08/19 0437  NA 139 139  K 3.5 3.4*  CL 99 97*  CO2 30 32  GLUCOSE 141* 95  BUN 44* 41*  CREATININE 1.00 0.95  CALCIUM 8.6* 8.3*   No results for input(s): AST, ALT, ALKPHOS, BILITOT, PROT, ALBUMIN in the last 72 hours. Recent Labs    06/07/19 0355 06/08/19 0437  WBC 7.6 7.9  NEUTROABS 5.0 4.9  HGB 14.4 14.1  HCT 42.7 41.2  MCV 87.7 87.5  PLT 229 225   No results for input(s): CKTOTAL, CKMB, TROPONINI in the last 72 hours. Invalid input(s): POCBNP No results for input(s): HGBA1C in the last 72 hours.   Weights: Filed Weights   06/06/19 0455 06/07/19 0530 06/08/19  [id number]  Weight: 88.3 kg 87.6 kg 85 kg     Radiology/Studies:  DG Chest 2 View  Result Date: 06/02/2019 CLINICAL DATA:  86 year old male with shortness of breath EXAM: CHEST - 2 VIEW COMPARISON:  Chest radiograph dated 07/18/2015. FINDINGS: There is mild cardiomegaly with vascular congestion and probable trace bilateral pleural effusions. Pneumonia is not excluded. Clinical correlation is recommended. Median  sternotomy wires and CABG vascular clips noted. Atherosclerotic calcification of the aortic arch. Osteopenia with degenerative changes of the spine. No acute osseous pathology. IMPRESSION: Mild cardiomegaly with findings of CHF. Pneumonia is not excluded. Electronically Signed   By: Anner Crete M.D.   On: 06/02/2019 21:03   US Venous Img Lower Bilateral  Result Date: 06/02/2019 CLINICAL DATA:  Initial evaluation for acute lower extremities swelling for 3 weeks EXAM: BILATERAL LOWER EXTREMITY VENOUS DOPPLER ULTRASOUND TECHNIQUE: Gray-scale sonography with graded compression, as well as color Doppler and duplex ultrasound were performed to evaluate the lower extremity deep venous systems from the level of the common femoral vein and including the common femoral, femoral, profunda femoral, popliteal and calf veins including the posterior tibial, peroneal and gastrocnemius veins when visible. The superficial great saphenous vein was also interrogated. Spectral Doppler was utilized to evaluate flow at rest and with distal augmentation maneuvers in the common femoral, femoral and popliteal veins. COMPARISON:  None. FINDINGS: RIGHT LOWER EXTREMITY Common Femoral Vein: No evidence of thrombus. Normal compressibility, respiratory phasicity and response to augmentation. Saphenofemoral Junction: No evidence of thrombus. Normal compressibility and flow on color Doppler imaging. Profunda Femoral Vein: No evidence of thrombus. Normal compressibility and flow on color Doppler imaging. Femoral Vein: No evidence of thrombus. Normal compressibility, respiratory phasicity and response to augmentation. Popliteal Vein: No evidence of thrombus. Normal compressibility, respiratory phasicity and response to augmentation. Calf Veins: Veins of the calf are not well visualized. No obvious thrombus or other abnormality. Superficial Great Saphenous Vein: No evidence of thrombus. Normal compressibility. Venous Reflux:  None. Other  Findings: Diffuse soft tissue/interstitial edema seen throughout the subcutaneous soft tissues of the lower leg/calf. LEFT LOWER EXTREMITY Common Femoral Vein: No evidence of thrombus. Normal compressibility, respiratory phasicity and response to augmentation. Saphenofemoral Junction: No evidence of thrombus. Normal compressibility and flow on color Doppler imaging. Profunda Femoral Vein: No evidence of thrombus. Normal compressibility and flow on color Doppler imaging. Femoral Vein: No evidence of thrombus. Normal compressibility, respiratory phasicity and response to augmentation. Popliteal Vein: No evidence of thrombus. Normal compressibility, respiratory phasicity and response to augmentation. Calf Veins: Veins of the calf are not well visualized. No obvious thrombus or other abnormality. Superficial Great Saphenous Vein: No evidence of thrombus. Normal compressibility. Venous Reflux:  None. Other Findings: Diffuse soft tissue/interstitial edema seen throughout the subcutaneous soft tissues of the lower leg/calf. IMPRESSION: 1. No evidence of deep venous thrombosis in either lower extremity. 2. Diffuse soft tissue/interstitial edema within the lower legs/calves bilaterally. Electronically Signed   By: Jeannine Boga M.D.   On: 06/02/2019 22:44   DG Chest Port 1 View  Result Date: 06/04/2019 CLINICAL DATA:  Aspiration EXAM: PORTABLE CHEST 1 VIEW COMPARISON:  06/02/2019 FINDINGS: Post CABG changes. Stable heart size. Atherosclerotic calcification of the aortic knob. Low lung volumes. Pulmonary vascular congestion with interstitial prominence, similar to prior. Probable small bilateral pleural effusions. No pneumothorax. IMPRESSION: Unchanged appearance of the chest with suggestion of CHF with mild edema. Electronically Signed   By: Davina Poke D.O.   On: 06/04/2019 15:23   ECHOCARDIOGRAM COMPLETE  Result Date: 06/04/2019    ECHOCARDIOGRAM REPORT   Patient Name:   GURTAJ ALMEIDA Date of Exam:  06/04/2019 Medical Rec #:  [id number]       Height:       70.0 in Accession #:    [id number]      Weight:       194.0 lb Date of Birth:  1927-07-16       BSA:          2.060 m Patient Age:    30 years        BP:           109/73 mmHg Patient Gender: M               HR:           94 bpm. Exam Location:  [facility] Procedure: 2D Echo Indications:     CHF-ACUTE DIASTOLIC 0000000  History:         Patient has prior history of Echocardiogram examinations, most                  recent 07/18/2015. CHF, CAD, Prior CABG,                  Signs/Symptoms:Shortness of Breath; Risk Factors:Diabetes.  Sonographer:     Avanell Shackleton Referring Phys:  [id number] [place] Diagnosing Phys: Serafina Royals MD  Sonographer Comments: I ATTEMPTED PEDOFF IN ALL VIEWS IMPRESSIONS  1. Left ventricular ejection fraction, by estimation, is <20%. The left ventricle has severely decreased function. The left ventricle demonstrates global hypokinesis. The left ventricular internal cavity size was moderately dilated. There is mild left ventricular hypertrophy. Left ventricular diastolic parameters were normal.  2. Right ventricular systolic function is normal. The right ventricular size is normal. There is moderately elevated pulmonary artery systolic pressure.  3. Left atrial size was moderately dilated.  4. Right atrial size was mildly dilated.  5. Large pleural effusion in the left lateral region.  6. The mitral valve is normal in structure. Moderate to severe mitral valve regurgitation.  7. Tricuspid valve regurgitation is moderate to severe.  8. The aortic valve is normal in structure. Aortic valve regurgitation is trivial. Moderate to severe aortic valve stenosis. FINDINGS  Left Ventricle: Left ventricular ejection fraction, by estimation, is <20%. The left ventricle has severely decreased function. The left ventricle demonstrates global hypokinesis. The left ventricular internal cavity size was moderately dilated. There is mild left ventricular  hypertrophy. Left ventricular diastolic parameters were normal. Right Ventricle: The right ventricular size is normal. No increase in right ventricular wall thickness. Right ventricular systolic function is normal. There is moderately elevated pulmonary artery systolic pressure. The tricuspid regurgitant velocity is 3.68 m/s, and with an assumed right atrial pressure of 10 mmHg, the estimated right ventricular systolic pressure is [id number]. Left Atrium: Left atrial size was moderately dilated. Right Atrium: Right atrial size was mildly dilated. Pericardium: There is no evidence of pericardial effusion. Mitral Valve: The mitral valve is normal in structure. Moderate to severe mitral valve regurgitation. Tricuspid Valve: The tricuspid valve is normal in structure. Tricuspid valve regurgitation is moderate to severe. Aortic Valve: The aortic valve is normal in structure. Aortic valve regurgitation is trivial. Moderate to severe aortic stenosis is present. Aortic valve mean gradient measures 43.3 mmHg. Aortic valve peak gradient measures 64.1 mmHg. Aortic valve area, by VTI measures 0.36 cm. Pulmonic Valve: The pulmonic valve was normal in structure. Pulmonic valve regurgitation is mild.  Aorta: The aortic root and ascending aorta are structurally normal, with no evidence of dilitation. IAS/Shunts: No atrial level shunt detected by color flow Doppler. Additional Comments: There is a large pleural effusion in the left lateral region.  LEFT VENTRICLE PLAX 2D LVIDd:         4.92 cm LVIDs:         4.28 cm LV PW:         0.94 cm LV IVS:        1.04 cm LVOT diam:     1.80 cm LV SV:         31 LV SV Index:   15 LVOT Area:     2.54 cm  LV Volumes (MOD) LV vol d, MOD A2C: 171.0 ml LV vol d, MOD A4C: 132.0 ml LV vol s, MOD A2C: 133.0 ml LV vol s, MOD A4C: 91.3 ml LV SV MOD A2C:     38.0 ml LV SV MOD A4C:     132.0 ml LV SV MOD BP:      38.2 ml IVC IVC diam: 2.53 cm LEFT ATRIUM             Index LA diam:        4.30 cm 2.09  cm/m LA Vol (A2C):   81.5 ml 39.55 ml/m LA Vol (A4C):   63.4 ml 30.77 ml/m LA Biplane Vol: 73.5 ml 35.67 ml/m  AORTIC VALVE AV Area (Vmax):    0.37 cm AV Area (Vmean):   0.33 cm AV Area (VTI):     0.36 cm AV Vmax:           400.33 cm/s AV Vmean:          314.667 cm/s AV VTI:            0.837 m AV Peak Grad:      64.1 mmHg AV Mean Grad:      43.3 mmHg LVOT Vmax:         58.70 cm/s LVOT Vmean:        40.500 cm/s LVOT VTI:          0.120 m LVOT/AV VTI ratio: 0.14  AORTA Ao Root diam: 3.60 cm MR Peak grad: 144.2 mmHg  TRICUSPID VALVE MR Mean grad: 85.7 mmHg   TR Peak grad:   54.2 mmHg MR Vmax:      600.33 cm/s TR Vmax:        368.00 cm/s MR Vmean:     430.3 cm/s                           SHUNTS                           Systemic VTI:  0.12 m                           Systemic Diam: 1.80 cm Serafina Royals MD Electronically signed by Serafina Royals MD Signature Date/Time: 06/04/2019/12:33:36 PM    Final      Assessment and Recommendation  86 y.o. male with acute on chronic systolic dysfunction congestive heart failure with elevated BNP and known coronary artery disease status post coronary bypass graft hypertension hyperlipidemia diabetes with elevated troponin consistent with demand ischemia rather than acute coronary syndrome now with very slow improvements over the last few days but continuing to have further urine output  and progress which is has been slow but has been somewhat fruitful at this time 1.  Continue intravenous Lasix for significant acute on chronic systolic dysfunction congestive heart failure.  Would consider continuing intravenous Lasix as long as patient continues to have slow urine output and improvements of heart failure 2.  No further cardiac diagnostic necessary due to no evidence of myocardial infarction and no apparent ability for aortic valve stenosis treatment surgically 3.  Continue metoprolol for heart rate control of atrial flutter with a goal heart rate between 60 and 80  bpm 4.  Anticoagulation for further risk reduction in stroke with atrial fibrillation with Eliquis unchanged 5.  Begin ambulation and follow-up for improvements of symptoms and possible adjustments of medications as able after above 6.  Possible home health as well when preparing for discharge and or hospice in the next day or 2 whenever available.  From the cardiac standpoint to would be able to be discharged if facility available Signed, Serafina Royals M.D. FACC

## 2019-06-08 NOTE — Progress Notes (Signed)
Physical Therapy Treatment Patient Details Name: Joseph Flowers MRN: [id number] DOB: 1927-11-17 Today's Date: 06/08/2019    History of Present Illness Harvis Ratzlaff  is a 86 y.o. Caucasian male with a known history of atrial ablation, diastolic CHF, type diabetes mellitus, coronary artery disease status post CABG, hypertension and dyslipidemia, who presented to the emergency room with acute onset of worsening dyspnea as well as lower extremity edema.  The patient has been having dyspnea on exertion.  He denies any significant cough or wheezing. He was noted to have low pulse oximetry that was down to 84% on ambulation on room air    PT Comments    Pt in room with son, agreeable to participate. Supervision level bed mobility, supervision transfers from elevated surface and RW. AMB progressed to 31ft this date, gait speed 75% faster than previous day which triggers a need to stand and recover SOB midway for 30sec. SpO2 92% after gait. 2x5 STS transfers from EOB after gait. Pt left on toilet with NA in room at end of session. Pt progressing well. Different son in room at end of session.      Follow Up Recommendations  Home health PT;Supervision - Intermittent     Equipment Recommendations  3in1 (PT);Other (comment)    Recommendations for Other Services       Precautions / Restrictions Precautions Precautions: Fall Restrictions Weight Bearing Restrictions: No    Mobility  Bed Mobility Overal bed mobility: Needs Assistance Bed Mobility: Supine to Sit     Supine to sit: Modified independent (Device/Increase time)        Transfers Overall transfer level: Needs assistance Equipment used: Rolling walker (2 wheeled) Transfers: Sit to/from Stand Sit to Stand: Supervision;From elevated surface         General transfer comment: performs 2 sets of 5x for leg strengthening; other transfers performed in session for mobility.  Ambulation/Gait Ambulation/Gait assistance:  Supervision Gait Distance (Feet): 360 Feet Assistive device: Rolling walker (2 wheeled) Gait Pattern/deviations: WFL(Within Functional Limits) Gait velocity: 0.60m/s this date; 0.65m/s previous day   General Gait Details: stops for SOB recovery halfway, SpO2 92%, HR 108   Stairs             Wheelchair Mobility    Modified Rankin (Stroke Patients Only)       Balance Overall balance assessment: Modified Independent;Mild deficits observed, not formally tested                                          Cognition Arousal/Alertness: Awake/alert Behavior During Therapy: WFL for tasks assessed/performed Overall Cognitive Status: Within Functional Limits for tasks assessed                                        Exercises      General Comments        Pertinent Vitals/Pain Pain Assessment: No/denies pain    Home Living                      Prior Function            PT Goals (current goals can now be found in the care plan section) Acute Rehab PT Goals Patient Stated Goal: to get up PT Goal Formulation: With patient Time For Goal Achievement:  06/19/19 Potential to Achieve Goals: Good Progress towards PT goals: Progressing toward goals    Frequency    Min 2X/week      PT Plan Current plan remains appropriate    Co-evaluation              AM-PAC PT "6 Clicks" Mobility   Outcome Measure  Help needed turning from your back to your side while in a flat bed without using bedrails?: A Little Help needed moving from lying on your back to sitting on the side of a flat bed without using bedrails?: A Little Help needed moving to and from a bed to a chair (including a wheelchair)?: A Little Help needed standing up from a chair using your arms (e.g., wheelchair or bedside chair)?: A Little Help needed to walk in hospital room?: A Little Help needed climbing 3-5 steps with a railing? : A Little 6 Click Score: 18     End of Session Equipment Utilized During Treatment: Gait belt Activity Tolerance: Patient tolerated treatment well;No increased pain Patient left: with call bell/phone within reach;with nursing/sitter in room;Other (comment)(in BR) Nurse Communication: Mobility status PT Visit Diagnosis: Unsteadiness on feet (R26.81);Repeated falls (R29.6);Muscle weakness (generalized) (M62.81);History of falling (Z91.81);Difficulty in walking, not elsewhere classified (R26.2)     Time: [id number] PT Time Calculation (min) (ACUTE ONLY): 25 min  Charges:  $Therapeutic Exercise: 23-37 mins                     4:40 PM, 06/08/19 Etta Grandchild, PT, DPT Physical Therapist - [facility]  [phone] ([place])    Adwolf C 06/08/2019, 4:37 PM

## 2019-06-09 DIAGNOSIS — I5031 Acute diastolic (congestive) heart failure: Secondary | ICD-10-CM | POA: Diagnosis not present

## 2019-06-09 LAB — BASIC METABOLIC PANEL
Anion gap: 10 (ref 5–15)
BUN: 37 mg/dL — ABNORMAL HIGH (ref 8–23)
CO2: 33 mmol/L — ABNORMAL HIGH (ref 22–32)
Calcium: 8.8 mg/dL — ABNORMAL LOW (ref 8.9–10.3)
Chloride: 94 mmol/L — ABNORMAL LOW (ref 98–111)
Creatinine, Ser: 1.06 mg/dL (ref 0.61–1.24)
GFR calc Af Amer: >60 mL/min (ref >60)
GFR calc non Af Amer: >60 mL/min (ref >60)
Glucose, Bld: 150 mg/dL — ABNORMAL HIGH (ref 70–99)
Potassium: 3.8 mmol/L (ref 3.5–5.1)
Sodium: 137 mmol/L (ref 135–145)

## 2019-06-09 LAB — CBC WITH DIFFERENTIAL/PLATELET
Abs Immature Granulocytes: 0.03 10*3/uL (ref 0.00–0.07)
Basophils Absolute: 0.1 10*3/uL (ref 0.0–0.1)
Basophils Relative: 1 %
Eosinophils Absolute: 0.9 10*3/uL — ABNORMAL HIGH (ref 0.0–0.5)
Eosinophils Relative: 11 %
HCT: 43.3 % (ref 39.0–52.0)
Hemoglobin: 14.4 g/dL (ref 13.0–17.0)
Immature Granulocytes: 0 %
Lymphocytes Relative: 20 %
Lymphs Abs: 1.6 10*3/uL (ref 0.7–4.0)
MCH: 29.4 pg (ref 26.0–34.0)
MCHC: 33.3 g/dL (ref 30.0–36.0)
MCV: 88.5 fL (ref 80.0–100.0)
Monocytes Absolute: 0.5 10*3/uL (ref 0.1–1.0)
Monocytes Relative: 7 %
Neutro Abs: 4.9 10*3/uL (ref 1.7–7.7)
Neutrophils Relative %: 61 %
Platelets: 243 10*3/uL (ref 150–400)
RBC: 4.89 MIL/uL (ref 4.22–5.81)
RDW: 14.8 % (ref 11.5–15.5)
WBC: 8 10*3/uL (ref 4.0–10.5)
nRBC: 0 % (ref 0.0–0.2)

## 2019-06-09 LAB — GLUCOSE, CAPILLARY
Glucose-Capillary: 144 mg/dL — ABNORMAL HIGH (ref 70–99)
Glucose-Capillary: 215 mg/dL — ABNORMAL HIGH (ref 70–99)

## 2019-06-09 MED ORDER — RAMIPRIL 2.5 MG PO CAPS: 2.5000 mg | ORAL_CAPSULE | Freq: Every day | ORAL | 0 refills | Status: DC

## 2019-06-09 MED ORDER — FUROSEMIDE 40 MG PO TABS: 40.0000 mg | ORAL_TABLET | Freq: Two times a day (BID) | ORAL | 0 refills | Status: DC

## 2019-06-09 MED ORDER — APIXABAN 5 MG PO TABS: 5.0000 mg | ORAL_TABLET | Freq: Two times a day (BID) | ORAL | 0 refills | Status: DC

## 2019-06-09 NOTE — Discharge Summary (Signed)
[facility] Discharge Summary   Patient: Joseph Flowers [id number]  PCP: Baxter Hire, MD  Date of admission: 06/02/2019   Date of discharge:  06/09/2019     Discharge Diagnoses:  Principal diagnosis Acute on chronic systolic CHF exacerbation Active Problems:   Acute CHF (congestive heart failure) (HCC)   Acute on chronic congestive heart failure ([place])   Goals of care, counseling/discussion   Palliative care by specialist   Encounter for hospice care discussion   DNR (do not resuscitate) discussion   Admitted From: Home Disposition:  Home with home health and hospice services  Recommendations for Outpatient Follow-up:  1. PCP: In 1 week and  hospice care at home 2.   Follow-up Information    [facility] Follow up on 06/13/2019.   Specialty: Cardiology Why: at 10:00am. Enter through the [facility] entrance Contact information: [address] [phone]         Diet recommendation: Regular diet  Activity: The patient is advised to gradually reintroduce usual activities, as tolerated  Discharge Condition: stable  Code Status: DNR   History of present illness: As per the H and P dictated on admission. Hospital Course:  Mr. Lapuma is a 86 y.o. M with permanent A. fib status post atrial ablation on Eliquis, dCHF, DM, CAD s/p CABG remote, and HTN who presented with several days worsening dyspnea as well as severe lower extremity edema.  He noted his home oxygen down to 84% on ambulation.  In the ER he 04/27/1990, at bedtime troponin elevated slightly but stable.  Creatinine 1.2, potassium 5.6.  Bilateral lower extremity duplex negative for DVT, but there is severe swelling and redness of bilateral calves.  EKG showed atrial flutter with normal rate.  Assessment & Plan:  Acute on chronic systolic and diastolic CHF Aortic stenosis, moderate to severe Mitral  regurgitation moderate to severe -Denies any shortness of breath, off oxygen, neurology consulted, patient was on IV Lasix 80 mg twice daily, tolerated well.  Switch to oral Lasix on discharge.   Echo on 4/9 showed newly severely decreased EF <20%, as well as aortic stenosis and mitral regurgitation.  Also effusion.    Cardiology following -recommending continuing IV diuretics as long as patient has low urine output... Recommending no intervention Consulted palliative care and goals of care were discussed and family decided for hospice care at home. Resumed metoprolol 50 mg p.o. daily. Given patient's advanced age, advanced valve disease, he is not a candidate for advanced heart failure therapies (milrinone, LVAD)   Pleural effusion, patient received IV diuresis, did not require thoracentesis.  Permanent atrial fibrillation status post ablation Rate controlled, -Continue Eliquis, resumed metoprolol  Diabetes, Glucose controlled, resumed Metformin, discontinued glimepiride as patient is more prone to develop hypoglycemia secondary to poor oral intake  Coronary disease, secondary prevention Hypertension, improved, resumed Toprol-XL, decreased dose of ramipril Continue home simvastatin  Cellulitis, ruled out No leukocytosis, fever to suggest infection.  Symptoms bilateral, militates against diagnosis of infection.  Hyperkalemia, resolved, Lokelma given once on 4/10 -Continue Lasix  BPH, -Continue finasteride   Severe debility, -Unable to ambulate without assist -PT/OT consulted for evaluation recommendation, home health arranged and patient is going with hospice services at home.  Body mass index is 27.48 kg/m.  Nutrition Problem: Inadequate oral intake Etiology: decreased appetite Nutrition Interventions: Interventions: Refer to RD note for recommendations    Patient was seen by physical therapy, who recommended  Home health, which was arranged. On the day of the  discharge the patient's vitals were stable, and no other acute medical condition were reported by patient. the patient was felt safe to be discharge at Home with Home health and Hospice care.   Consultants: Cardiologist and palliative care Procedures: None  Discharge Exam: General: Appear in mild distress, no Rash; Oral Mucosa Clear, moist. Cardiovascular: S1 and S2 Present, no Murmur, Respiratory: normal respiratory effort, Bilateral Air entry present and no Crackles, no wheezes Abdomen: Bowel Sound present, Soft and no tenderness, no hernia Extremities: 2-3+ Pedal edema, no calf tenderness Neurology: alert and oriented to place and person affect appropriate.  Filed Weights   06/07/19 0530 06/08/19 0318 06/09/19 0352  Weight: 87.6 kg 85 kg 86.9 kg   Vitals:   06/09/19 0710 06/09/19 1124  BP: 110/78 113/74  Pulse: 90 88  Resp: 18 18  Temp:  (!) 97.4 F (36.3 C)  SpO2: 94% 96%    DISCHARGE MEDICATION: Allergies as of 06/09/2019   No Known Allergies     Medication List    STOP taking these medications   glimepiride 2 MG tablet Commonly known as: AMARYL   glimepiride 4 MG tablet Commonly known as: AMARYL     TAKE these medications   apixaban 5 MG Tabs tablet Commonly known as: ELIQUIS Take 1 tablet (5 mg total) by mouth 2 (two) times daily.   aspirin EC 81 MG tablet Take 1 tablet (81 mg total) by mouth daily. What changed: when to take this   finasteride 5 MG tablet Commonly known as: PROSCAR Take 5 mg by mouth daily.   furosemide 40 MG tablet Commonly known as: LASIX Take 1 tablet (40 mg total) by mouth 2 (two) times daily. What changed:   medication strength  how much to take  when to take this   metFORMIN 500 MG tablet Commonly known as: GLUCOPHAGE Take 500 mg by mouth daily with breakfast.   metoprolol succinate 50 MG 24 hr tablet Commonly known as: TOPROL-XL Take 1 tablet (50 mg total) by mouth daily. Take with or immediately following a  meal.   ramipril 2.5 MG capsule Commonly known as: ALTACE Take 1 capsule (2.5 mg total) by mouth daily. What changed:   medication strength  how much to take   Santyl ointment Generic drug: collagenase APPLY TO LEFT LEG EVERY OTHER DAY   simvastatin 20 MG tablet Commonly known as: ZOCOR Take 20 mg by mouth at bedtime.            Durable Medical Equipment  (From admission, onward)         Start     Ordered   06/08/19 1341  For home use only DME Shower stool  Once     06/08/19 1340   06/08/19 1340  For home use only DME Bedside commode  Once    Question:  Patient needs a bedside commode to treat with the following condition  Answer:  Debilitated   06/08/19 1339   06/08/19 1338  For home use only DME Hospital bed  Once    Question Answer Comment  Length of Need 6 Months   Head must be elevated greater than: 30 degrees   Bed type Semi-electric      06/08/19 1337         No Known Allergies Discharge Instructions    AMB referral to CHF clinic   Complete by: As directed    AMB referral to pulmonary rehabilitation  Complete by: As directed    Please select a program: Respiratory Care Services   Respiratory Care Services Diagnosis: Heart Failure   After initial evaluation and assessments completed: Virtual Based Care may be provided alone or in conjunction with Pulmonary Rehab/Respiratory Care services based on patient barriers.: Yes   Amb referral to AFIB Clinic   Complete by: As directed    Call MD for:   Complete by: As directed    Shortness of breath, chest pain or palpitations   Diet - low sodium heart healthy   Complete by: As directed    Discharge instructions   Complete by: As directed    Continue to monitor blood pressure and monitor volume overload status Follow with PCP and hospice care for diuretic management   Increase activity slowly   Complete by: As directed       The results of significant diagnostics from this hospitalization (including  imaging, microbiology, ancillary and laboratory) are listed below for reference.    Significant Diagnostic Studies: DG Chest 2 View  Result Date: 06/02/2019 CLINICAL DATA:  86 year old male with shortness of breath EXAM: CHEST - 2 VIEW COMPARISON:  Chest radiograph dated 07/18/2015. FINDINGS: There is mild cardiomegaly with vascular congestion and probable trace bilateral pleural effusions. Pneumonia is not excluded. Clinical correlation is recommended. Median sternotomy wires and CABG vascular clips noted. Atherosclerotic calcification of the aortic arch. Osteopenia with degenerative changes of the spine. No acute osseous pathology. IMPRESSION: Mild cardiomegaly with findings of CHF. Pneumonia is not excluded. Electronically Signed   By: Anner Crete M.D.   On: 06/02/2019 21:03   US Venous Img Lower Bilateral  Result Date: 06/02/2019 CLINICAL DATA:  Initial evaluation for acute lower extremities swelling for 3 weeks EXAM: BILATERAL LOWER EXTREMITY VENOUS DOPPLER ULTRASOUND TECHNIQUE: Gray-scale sonography with graded compression, as well as color Doppler and duplex ultrasound were performed to evaluate the lower extremity deep venous systems from the level of the common femoral vein and including the common femoral, femoral, profunda femoral, popliteal and calf veins including the posterior tibial, peroneal and gastrocnemius veins when visible. The superficial great saphenous vein was also interrogated. Spectral Doppler was utilized to evaluate flow at rest and with distal augmentation maneuvers in the common femoral, femoral and popliteal veins. COMPARISON:  None. FINDINGS: RIGHT LOWER EXTREMITY Common Femoral Vein: No evidence of thrombus. Normal compressibility, respiratory phasicity and response to augmentation. Saphenofemoral Junction: No evidence of thrombus. Normal compressibility and flow on color Doppler imaging. Profunda Femoral Vein: No evidence of thrombus. Normal compressibility and flow on  color Doppler imaging. Femoral Vein: No evidence of thrombus. Normal compressibility, respiratory phasicity and response to augmentation. Popliteal Vein: No evidence of thrombus. Normal compressibility, respiratory phasicity and response to augmentation. Calf Veins: Veins of the calf are not well visualized. No obvious thrombus or other abnormality. Superficial Great Saphenous Vein: No evidence of thrombus. Normal compressibility. Venous Reflux:  None. Other Findings: Diffuse soft tissue/interstitial edema seen throughout the subcutaneous soft tissues of the lower leg/calf. LEFT LOWER EXTREMITY Common Femoral Vein: No evidence of thrombus. Normal compressibility, respiratory phasicity and response to augmentation. Saphenofemoral Junction: No evidence of thrombus. Normal compressibility and flow on color Doppler imaging. Profunda Femoral Vein: No evidence of thrombus. Normal compressibility and flow on color Doppler imaging. Femoral Vein: No evidence of thrombus. Normal compressibility, respiratory phasicity and response to augmentation. Popliteal Vein: No evidence of thrombus. Normal compressibility, respiratory phasicity and response to augmentation. Calf Veins: Veins of the calf are not well  visualized. No obvious thrombus or other abnormality. Superficial Great Saphenous Vein: No evidence of thrombus. Normal compressibility. Venous Reflux:  None. Other Findings: Diffuse soft tissue/interstitial edema seen throughout the subcutaneous soft tissues of the lower leg/calf. IMPRESSION: 1. No evidence of deep venous thrombosis in either lower extremity. 2. Diffuse soft tissue/interstitial edema within the lower legs/calves bilaterally. Electronically Signed   By: Jeannine Boga M.D.   On: 06/02/2019 22:44   DG Chest Port 1 View  Result Date: 06/04/2019 CLINICAL DATA:  Aspiration EXAM: PORTABLE CHEST 1 VIEW COMPARISON:  06/02/2019 FINDINGS: Post CABG changes. Stable heart size. Atherosclerotic calcification of  the aortic knob. Low lung volumes. Pulmonary vascular congestion with interstitial prominence, similar to prior. Probable small bilateral pleural effusions. No pneumothorax. IMPRESSION: Unchanged appearance of the chest with suggestion of CHF with mild edema. Electronically Signed   By: Davina Poke D.O.   On: 06/04/2019 15:23   ECHOCARDIOGRAM COMPLETE  Result Date: 06/04/2019    ECHOCARDIOGRAM REPORT   Patient Name:   JACK CIBELLI Date of Exam: 06/04/2019 Medical Rec #:  [id number]       Height:       70.0 in Accession #:    [id number]      Weight:       194.0 lb Date of Birth:  1927/04/23       BSA:          2.060 m Patient Age:    39 years        BP:           109/73 mmHg Patient Gender: M               HR:           94 bpm. Exam Location:  [facility] Procedure: 2D Echo Indications:     CHF-ACUTE DIASTOLIC [id number]  History:         Patient has prior history of Echocardiogram examinations, most                  recent 07/18/2015. CHF, CAD, Prior CABG,                  Signs/Symptoms:Shortness of Breath; Risk Factors:Diabetes.  Sonographer:     Avanell Shackleton Referring Phys:  [id number] [place] Diagnosing Phys: Serafina Royals MD  Sonographer Comments: I ATTEMPTED PEDOFF IN ALL VIEWS IMPRESSIONS  1. Left ventricular ejection fraction, by estimation, is <20%. The left ventricle has severely decreased function. The left ventricle demonstrates global hypokinesis. The left ventricular internal cavity size was moderately dilated. There is mild left ventricular hypertrophy. Left ventricular diastolic parameters were normal.  2. Right ventricular systolic function is normal. The right ventricular size is normal. There is moderately elevated pulmonary artery systolic pressure.  3. Left atrial size was moderately dilated.  4. Right atrial size was mildly dilated.  5. Large pleural effusion in the left lateral region.  6. The mitral valve is normal in structure. Moderate to severe mitral valve regurgitation.  7.  Tricuspid valve regurgitation is moderate to severe.  8. The aortic valve is normal in structure. Aortic valve regurgitation is trivial. Moderate to severe aortic valve stenosis. FINDINGS  Left Ventricle: Left ventricular ejection fraction, by estimation, is <20%. The left ventricle has severely decreased function. The left ventricle demonstrates global hypokinesis. The left ventricular internal cavity size was moderately dilated. There is mild left ventricular hypertrophy. Left ventricular diastolic parameters were normal. Right Ventricle: The right ventricular size is  normal. No increase in right ventricular wall thickness. Right ventricular systolic function is normal. There is moderately elevated pulmonary artery systolic pressure. The tricuspid regurgitant velocity is 3.68 m/s, and with an assumed right atrial pressure of 10 mmHg, the estimated right ventricular systolic pressure is [id number]. Left Atrium: Left atrial size was moderately dilated. Right Atrium: Right atrial size was mildly dilated. Pericardium: There is no evidence of pericardial effusion. Mitral Valve: The mitral valve is normal in structure. Moderate to severe mitral valve regurgitation. Tricuspid Valve: The tricuspid valve is normal in structure. Tricuspid valve regurgitation is moderate to severe. Aortic Valve: The aortic valve is normal in structure. Aortic valve regurgitation is trivial. Moderate to severe aortic stenosis is present. Aortic valve mean gradient measures 43.3 mmHg. Aortic valve peak gradient measures 64.1 mmHg. Aortic valve area, by VTI measures 0.36 cm. Pulmonic Valve: The pulmonic valve was normal in structure. Pulmonic valve regurgitation is mild. Aorta: The aortic root and ascending aorta are structurally normal, with no evidence of dilitation. IAS/Shunts: No atrial level shunt detected by color flow Doppler. Additional Comments: There is a large pleural effusion in the left lateral region.  LEFT VENTRICLE PLAX 2D  LVIDd:         4.92 cm LVIDs:         4.28 cm LV PW:         0.94 cm LV IVS:        1.04 cm LVOT diam:     1.80 cm LV SV:         31 LV SV Index:   15 LVOT Area:     2.54 cm  LV Volumes (MOD) LV vol d, MOD A2C: 171.0 ml LV vol d, MOD A4C: 132.0 ml LV vol s, MOD A2C: 133.0 ml LV vol s, MOD A4C: 91.3 ml LV SV MOD A2C:     38.0 ml LV SV MOD A4C:     132.0 ml LV SV MOD BP:      38.2 ml IVC IVC diam: 2.53 cm LEFT ATRIUM             Index LA diam:        4.30 cm 2.09 cm/m LA Vol (A2C):   81.5 ml 39.55 ml/m LA Vol (A4C):   63.4 ml 30.77 ml/m LA Biplane Vol: 73.5 ml 35.67 ml/m  AORTIC VALVE AV Area (Vmax):    0.37 cm AV Area (Vmean):   0.33 cm AV Area (VTI):     0.36 cm AV Vmax:           400.33 cm/s AV Vmean:          314.667 cm/s AV VTI:            0.837 m AV Peak Grad:      64.1 mmHg AV Mean Grad:      43.3 mmHg LVOT Vmax:         58.70 cm/s LVOT Vmean:        40.500 cm/s LVOT VTI:          0.120 m LVOT/AV VTI ratio: 0.14  AORTA Ao Root diam: 3.60 cm MR Peak grad: 144.2 mmHg  TRICUSPID VALVE MR Mean grad: 85.7 mmHg   TR Peak grad:   54.2 mmHg MR Vmax:      600.33 cm/s TR Vmax:        368.00 cm/s MR Vmean:     430.3 cm/s  SHUNTS                           Systemic VTI:  0.12 m                           Systemic Diam: 1.80 cm Serafina Royals MD Electronically signed by Serafina Royals MD Signature Date/Time: 06/04/2019/12:33:36 PM    Final     Microbiology: Recent Results (from the past 240 hour(s))  Respiratory Panel by RT PCR (Flu A&B, Covid) - Nasopharyngeal Swab     Status: None   Collection Time: 06/03/19  1:34 AM   Specimen: Nasopharyngeal Swab  Result Value Ref Range Status   SARS Coronavirus 2 by RT PCR NEGATIVE NEGATIVE Final    Comment: (NOTE) SARS-CoV-2 target nucleic acids are NOT DETECTED. The SARS-CoV-2 RNA is generally detectable in upper respiratoy specimens during the acute phase of infection. The lowest concentration of SARS-CoV-2 viral copies this assay can  detect is 131 copies/mL. A negative result does not preclude SARS-Cov-2 infection and should not be used as the sole basis for treatment or other patient management decisions. A negative result may occur with  improper specimen collection/handling, submission of specimen other than nasopharyngeal swab, presence of viral mutation(s) within the areas targeted by this assay, and inadequate number of viral copies (<131 copies/mL). A negative result must be combined with clinical observations, patient history, and epidemiological information. The expected result is Negative. Fact Sheet for Patients:  PinkCheek.be Fact Sheet for Healthcare Providers:  GravelBags.it This test is not yet ap proved or cleared by the [place] FDA and  has been authorized for detection and/or diagnosis of SARS-CoV-2 by FDA under an Emergency Use Authorization (EUA). This EUA will remain  in effect (meaning this test can be used) for the duration of the COVID-19 declaration under Section 564(b)(1) of the Act, 21 U.S.C. section 360bbb-3(b)(1), unless the authorization is terminated or revoked sooner.    Influenza A by PCR NEGATIVE NEGATIVE Final   Influenza B by PCR NEGATIVE NEGATIVE Final    Comment: (NOTE) The Xpert Xpress SARS-CoV-2/FLU/RSV assay is intended as an aid in  the diagnosis of influenza from Nasopharyngeal swab specimens and  should not be used as a sole basis for treatment. Nasal washings and  aspirates are unacceptable for Xpert Xpress SARS-CoV-2/FLU/RSV  testing. Fact Sheet for Patients: PinkCheek.be Fact Sheet for Healthcare Providers: GravelBags.it This test is not yet approved or cleared by the [place] FDA and  has been authorized for detection and/or diagnosis of SARS-CoV-2 by  FDA under an Emergency Use Authorization (EUA). This EUA will remain  in effect (meaning  this test can be used) for the duration of the  Covid-19 declaration under Section 564(b)(1) of the Act, 21  U.S.C. section 360bbb-3(b)(1), unless the authorization is  terminated or revoked. Performed at [facility], [address] [place], [place] [address]      Labs: CBC: Recent Labs  Lab 06/05/19 0431 06/06/19 0522 06/07/19 0355 06/08/19 0437 06/09/19 0410  WBC 8.1 7.1 7.6 7.9 8.0  NEUTROABS 5.1 4.7 5.0 4.9 4.9  HGB 13.9 13.9 14.4 14.1 14.4  HCT 43.1 42.3 42.7 41.2 43.3  MCV 91.3 89.1 87.7 87.5 88.5  PLT 229 211 229 225 0000000   Basic Metabolic Panel: Recent Labs  Lab 06/03/19 0336 06/04/19 0441 06/05/19 0431 06/06/19 0522 06/07/19 0355 06/08/19 0437 06/09/19 0410  NA  --    < >  135 135 139 139 137  K  --    < > 5.2* 3.8 3.5 3.4* 3.8  CL  --    < > 100 99 99 97* 94*  CO2  --    < > 28 27 30  32 33*  GLUCOSE  --    < > 104* 170* 141* 95 150*  BUN  --    < > 45* 47* 44* 41* 37*  CREATININE  --    < > 1.36* 1.06 1.00 0.95 1.06  CALCIUM  --    < > 8.6* 8.6* 8.6* 8.3* 8.8*  MG 2.2  --   --   --   --   --   --    < > = values in this interval not displayed.   Liver Function Tests: Recent Labs  Lab 06/02/19 2124  AST 37  ALT 36  ALKPHOS 153*  BILITOT 1.2  PROT 6.5  ALBUMIN 3.1*   No results for input(s): LIPASE, AMYLASE in the last 168 hours. No results for input(s): AMMONIA in the last 168 hours. Cardiac Enzymes: No results for input(s): CKTOTAL, CKMB, CKMBINDEX, TROPONINI in the last 168 hours. BNP (last 3 results) Recent Labs    06/02/19 2124  BNP 3,392.0*   CBG: Recent Labs  Lab 06/08/19 1217 06/08/19 1627 06/08/19 2010 06/09/19 0711 06/09/19 1128  GLUCAP 222* 247* 192* 144* 215*    Time spent: 35 minutes  Signed:  Val Riles  [facility]  06/09/2019 2:12 PM

## 2019-06-09 NOTE — Care Management Important Message (Signed)
Important Message  Patient Details  Name: PETROS RAIA MRN: [id number] Date of Birth: 30-Oct-1927   Medicare Important Message Given:  Yes     Dannette Barbara 06/09/2019, 2:13 PM

## 2019-06-09 NOTE — Progress Notes (Signed)
[facility] Encounter Note  Patient: Joseph Flowers / Admit Date: 06/02/2019 / Date of Encounter: 06/09/2019, 8:54 AM   Subjective: Patient is slightly improved from admission slight improvements in lower extremity edema and PND orthopnea.  After review of ins and outs it does appear that the patient continues to have daily output Echocardiogram showing severe LV systolic dysfunction with ejection fraction of 20% with moderate aortic stenosis moderate to severe mitral regurgitation appears to be significantly changed from before and likely the primary cause of current admission Overall patient has relatively poor prognosis long-term with multiple issues above Review of Systems: Positive for: Shortness of breath edema Negative for: Vision change, hearing change, syncope, dizziness, nausea, vomiting,diarrhea, bloody stool, stomach pain, cough, congestion, diaphoresis, urinary frequency, urinary pain,skin lesions, skin rashes Others previously listed  Objective: Telemetry: Atrial flutter with bundle branch block Physical Exam: Blood pressure 110/78, pulse 90, temperature 97.6 F (36.4 C), temperature source Oral, resp. rate 18, height 5\' 10"  (1.778 m), weight 86.9 kg, SpO2 94 %. Body mass index is 27.48 kg/m. General: Well developed, well nourished, in no acute distress. Head: Normocephalic, atraumatic, sclera non-icteric, no xanthomas, nares are without discharge. Neck: No apparent masses Lungs: Normal respirations with few to wheezes, no rhonchi, Xolair basilar rales , no crackles   Heart: Regular rate and rhythm, normal S1 S2, no 3+ aortic murmur, no rub, no gallop, PMI is normal size and placement, carotid upstroke normal without bruit, jugular venous pressure normal Abdomen: Soft, non-tender, non-distended with normoactive bowel sounds. No hepatosplenomegaly. Abdominal aorta is normal size without bruit Extremities: 2+ edema, no clubbing, no cyanosis, positive ulcers,   Peripheral: 2+ radial, 2+ femoral, 2+ dorsal pedal pulses Neuro: Alert and oriented. Moves all extremities spontaneously. Psych:  Responds to questions appropriately with a normal affect.   Intake/Output Summary (Last 24 hours) at 06/09/2019 0854 Last data filed at 06/09/2019 0409 Gross per 24 hour  Intake 240 ml  Output 975 ml  Net -735 ml    Inpatient Medications:  . apixaban  5 mg Oral BID  . aspirin EC  81 mg Oral BID  . collagenase   Topical QODAY  . feeding supplement (NEPRO CARB STEADY)  237 mL Oral BID BM  . finasteride  5 mg Oral Daily  . furosemide  80 mg Intravenous BID  . insulin aspart  0-9 Units Subcutaneous TID PC & HS  . mouth rinse  15 mL Mouth Rinse BID  . metoprolol succinate  50 mg Oral Daily  . simvastatin  20 mg Oral QHS  . sodium chloride flush  3 mL Intravenous Q12H   Infusions:  . sodium chloride      Labs: Recent Labs    06/08/19 0437 06/09/19 0410  NA 139 137  K 3.4* 3.8  CL 97* 94*  CO2 32 33*  GLUCOSE 95 150*  BUN 41* 37*  CREATININE 0.95 1.06  CALCIUM 8.3* 8.8*   No results for input(s): AST, ALT, ALKPHOS, BILITOT, PROT, ALBUMIN in the last 72 hours. Recent Labs    06/08/19 0437 06/09/19 0410  WBC 7.9 8.0  NEUTROABS 4.9 4.9  HGB 14.1 14.4  HCT 41.2 43.3  MCV 87.5 88.5  PLT 225 243   No results for input(s): CKTOTAL, CKMB, TROPONINI in the last 72 hours. Invalid input(s): POCBNP No results for input(s): HGBA1C in the last 72 hours.   Weights: Filed Weights   06/07/19 0530 06/08/19 0318 06/09/19 0352  Weight: 87.6 kg 85 kg  86.9 kg     Radiology/Studies:  DG Chest 2 View  Result Date: 06/02/2019 CLINICAL DATA:  86 year old male with shortness of breath EXAM: CHEST - 2 VIEW COMPARISON:  Chest radiograph dated 07/18/2015. FINDINGS: There is mild cardiomegaly with vascular congestion and probable trace bilateral pleural effusions. Pneumonia is not excluded. Clinical correlation is recommended. Median sternotomy wires and  CABG vascular clips noted. Atherosclerotic calcification of the aortic arch. Osteopenia with degenerative changes of the spine. No acute osseous pathology. IMPRESSION: Mild cardiomegaly with findings of CHF. Pneumonia is not excluded. Electronically Signed   By: Anner Crete M.D.   On: 06/02/2019 21:03   US Venous Img Lower Bilateral  Result Date: 06/02/2019 CLINICAL DATA:  Initial evaluation for acute lower extremities swelling for 3 weeks EXAM: BILATERAL LOWER EXTREMITY VENOUS DOPPLER ULTRASOUND TECHNIQUE: Gray-scale sonography with graded compression, as well as color Doppler and duplex ultrasound were performed to evaluate the lower extremity deep venous systems from the level of the common femoral vein and including the common femoral, femoral, profunda femoral, popliteal and calf veins including the posterior tibial, peroneal and gastrocnemius veins when visible. The superficial great saphenous vein was also interrogated. Spectral Doppler was utilized to evaluate flow at rest and with distal augmentation maneuvers in the common femoral, femoral and popliteal veins. COMPARISON:  None. FINDINGS: RIGHT LOWER EXTREMITY Common Femoral Vein: No evidence of thrombus. Normal compressibility, respiratory phasicity and response to augmentation. Saphenofemoral Junction: No evidence of thrombus. Normal compressibility and flow on color Doppler imaging. Profunda Femoral Vein: No evidence of thrombus. Normal compressibility and flow on color Doppler imaging. Femoral Vein: No evidence of thrombus. Normal compressibility, respiratory phasicity and response to augmentation. Popliteal Vein: No evidence of thrombus. Normal compressibility, respiratory phasicity and response to augmentation. Calf Veins: Veins of the calf are not well visualized. No obvious thrombus or other abnormality. Superficial Great Saphenous Vein: No evidence of thrombus. Normal compressibility. Venous Reflux:  None. Other Findings: Diffuse soft  tissue/interstitial edema seen throughout the subcutaneous soft tissues of the lower leg/calf. LEFT LOWER EXTREMITY Common Femoral Vein: No evidence of thrombus. Normal compressibility, respiratory phasicity and response to augmentation. Saphenofemoral Junction: No evidence of thrombus. Normal compressibility and flow on color Doppler imaging. Profunda Femoral Vein: No evidence of thrombus. Normal compressibility and flow on color Doppler imaging. Femoral Vein: No evidence of thrombus. Normal compressibility, respiratory phasicity and response to augmentation. Popliteal Vein: No evidence of thrombus. Normal compressibility, respiratory phasicity and response to augmentation. Calf Veins: Veins of the calf are not well visualized. No obvious thrombus or other abnormality. Superficial Great Saphenous Vein: No evidence of thrombus. Normal compressibility. Venous Reflux:  None. Other Findings: Diffuse soft tissue/interstitial edema seen throughout the subcutaneous soft tissues of the lower leg/calf. IMPRESSION: 1. No evidence of deep venous thrombosis in either lower extremity. 2. Diffuse soft tissue/interstitial edema within the lower legs/calves bilaterally. Electronically Signed   By: Jeannine Boga M.D.   On: 06/02/2019 22:44   DG Chest Port 1 View  Result Date: 06/04/2019 CLINICAL DATA:  Aspiration EXAM: PORTABLE CHEST 1 VIEW COMPARISON:  06/02/2019 FINDINGS: Post CABG changes. Stable heart size. Atherosclerotic calcification of the aortic knob. Low lung volumes. Pulmonary vascular congestion with interstitial prominence, similar to prior. Probable small bilateral pleural effusions. No pneumothorax. IMPRESSION: Unchanged appearance of the chest with suggestion of CHF with mild edema. Electronically Signed   By: Davina Poke D.O.   On: 06/04/2019 15:23   ECHOCARDIOGRAM COMPLETE  Result Date: 06/04/2019  ECHOCARDIOGRAM REPORT   Patient Name:   Joseph Flowers Date of Exam: 06/04/2019 Medical Rec #:   [id number]       Height:       70.0 in Accession #:    [id number]      Weight:       194.0 lb Date of Birth:  1927/11/18       BSA:          2.060 m Patient Age:    26 years        BP:           109/73 mmHg Patient Gender: M               HR:           94 bpm. Exam Location:  [facility] Procedure: 2D Echo Indications:     CHF-ACUTE DIASTOLIC [id number]  History:         Patient has prior history of Echocardiogram examinations, most                  recent 07/18/2015. CHF, CAD, Prior CABG,                  Signs/Symptoms:Shortness of Breath; Risk Factors:Diabetes.  Sonographer:     Avanell Shackleton Referring Phys:  [id number] [place] Diagnosing Phys: Serafina Royals MD  Sonographer Comments: I ATTEMPTED PEDOFF IN ALL VIEWS IMPRESSIONS  1. Left ventricular ejection fraction, by estimation, is <20%. The left ventricle has severely decreased function. The left ventricle demonstrates global hypokinesis. The left ventricular internal cavity size was moderately dilated. There is mild left ventricular hypertrophy. Left ventricular diastolic parameters were normal.  2. Right ventricular systolic function is normal. The right ventricular size is normal. There is moderately elevated pulmonary artery systolic pressure.  3. Left atrial size was moderately dilated.  4. Right atrial size was mildly dilated.  5. Large pleural effusion in the left lateral region.  6. The mitral valve is normal in structure. Moderate to severe mitral valve regurgitation.  7. Tricuspid valve regurgitation is moderate to severe.  8. The aortic valve is normal in structure. Aortic valve regurgitation is trivial. Moderate to severe aortic valve stenosis. FINDINGS  Left Ventricle: Left ventricular ejection fraction, by estimation, is <20%. The left ventricle has severely decreased function. The left ventricle demonstrates global hypokinesis. The left ventricular internal cavity size was moderately dilated. There is mild left ventricular hypertrophy. Left  ventricular diastolic parameters were normal. Right Ventricle: The right ventricular size is normal. No increase in right ventricular wall thickness. Right ventricular systolic function is normal. There is moderately elevated pulmonary artery systolic pressure. The tricuspid regurgitant velocity is 3.68 m/s, and with an assumed right atrial pressure of 10 mmHg, the estimated right ventricular systolic pressure is [id number]. Left Atrium: Left atrial size was moderately dilated. Right Atrium: Right atrial size was mildly dilated. Pericardium: There is no evidence of pericardial effusion. Mitral Valve: The mitral valve is normal in structure. Moderate to severe mitral valve regurgitation. Tricuspid Valve: The tricuspid valve is normal in structure. Tricuspid valve regurgitation is moderate to severe. Aortic Valve: The aortic valve is normal in structure. Aortic valve regurgitation is trivial. Moderate to severe aortic stenosis is present. Aortic valve mean gradient measures 43.3 mmHg. Aortic valve peak gradient measures 64.1 mmHg. Aortic valve area, by VTI measures 0.36 cm. Pulmonic Valve: The pulmonic valve was normal in structure. Pulmonic valve regurgitation is mild. Aorta: The aortic root and ascending  aorta are structurally normal, with no evidence of dilitation. IAS/Shunts: No atrial level shunt detected by color flow Doppler. Additional Comments: There is a large pleural effusion in the left lateral region.  LEFT VENTRICLE PLAX 2D LVIDd:         4.92 cm LVIDs:         4.28 cm LV PW:         0.94 cm LV IVS:        1.04 cm LVOT diam:     1.80 cm LV SV:         31 LV SV Index:   15 LVOT Area:     2.54 cm  LV Volumes (MOD) LV vol d, MOD A2C: 171.0 ml LV vol d, MOD A4C: 132.0 ml LV vol s, MOD A2C: 133.0 ml LV vol s, MOD A4C: 91.3 ml LV SV MOD A2C:     38.0 ml LV SV MOD A4C:     132.0 ml LV SV MOD BP:      38.2 ml IVC IVC diam: 2.53 cm LEFT ATRIUM             Index LA diam:        4.30 cm 2.09 cm/m LA Vol (A2C):    81.5 ml 39.55 ml/m LA Vol (A4C):   63.4 ml 30.77 ml/m LA Biplane Vol: 73.5 ml 35.67 ml/m  AORTIC VALVE AV Area (Vmax):    0.37 cm AV Area (Vmean):   0.33 cm AV Area (VTI):     0.36 cm AV Vmax:           400.33 cm/s AV Vmean:          314.667 cm/s AV VTI:            0.837 m AV Peak Grad:      64.1 mmHg AV Mean Grad:      43.3 mmHg LVOT Vmax:         58.70 cm/s LVOT Vmean:        40.500 cm/s LVOT VTI:          0.120 m LVOT/AV VTI ratio: 0.14  AORTA Ao Root diam: 3.60 cm MR Peak grad: 144.2 mmHg  TRICUSPID VALVE MR Mean grad: 85.7 mmHg   TR Peak grad:   54.2 mmHg MR Vmax:      600.33 cm/s TR Vmax:        368.00 cm/s MR Vmean:     430.3 cm/s                           SHUNTS                           Systemic VTI:  0.12 m                           Systemic Diam: 1.80 cm Serafina Royals MD Electronically signed by Serafina Royals MD Signature Date/Time: 06/04/2019/12:33:36 PM    Final      Assessment and Recommendation  86 y.o. male with acute on chronic systolic dysfunction congestive heart failure with elevated BNP and known coronary artery disease status post coronary bypass graft hypertension hyperlipidemia diabetes with elevated troponin consistent with demand ischemia rather than acute coronary syndrome now with very slow improvements over the last few days but continuing to have further urine output and progress which is has been  slow but has been somewhat fruitful at this time 1.  Continue intravenous Lasix for significant acute on chronic systolic dysfunction congestive heart failure.  Would consider continuing intravenous Lasix as long as patient continues to have slow urine output and improvements of heart failure 2.  No further cardiac diagnostic necessary due to no evidence of myocardial infarction and no apparent ability for aortic valve stenosis treatment surgically 3.  Continue metoprolol for heart rate control of atrial flutter with a goal heart rate between 60 and 80 bpm 4.  Anticoagulation  for further risk reduction in stroke with atrial fibrillation with Eliquis unchanged 5.  Begin ambulation and follow-up for improvements of symptoms and possible adjustments of medications as able after above 6.  Possible home health as well when preparing for discharge and or hospice in the next day or 2 whenever available.  From the cardiac standpoint to would be able to be discharged if facility available Signed, Serafina Royals M.D. FACC

## 2019-06-09 NOTE — Progress Notes (Signed)
IV and tele removed from patient. Discharge instructions given to patient, son and daughter at bedside. Verbalized understanding. No acute distress at this time. Family to transport patient home.

## 2019-06-13 ENCOUNTER — Ambulatory Visit: Payer: Medicare Other | Admitting: Family

## 2019-07-11 ENCOUNTER — Emergency Department

## 2019-07-11 ENCOUNTER — Encounter: Payer: Self-pay | Admitting: Emergency Medicine

## 2019-07-11 ENCOUNTER — Observation Stay
Admission: EM | Admit: 2019-07-11 | Discharge: 2019-07-12 | Disposition: A | Discharge status: Home / Self Care | Attending: Internal Medicine | Admitting: Internal Medicine

## 2019-07-11 DIAGNOSIS — E119 Type 2 diabetes mellitus without complications: Secondary | ICD-10-CM | POA: Insufficient documentation

## 2019-07-11 DIAGNOSIS — N179 Acute kidney failure, unspecified: Secondary | ICD-10-CM

## 2019-07-11 DIAGNOSIS — I509 Heart failure, unspecified: Secondary | ICD-10-CM

## 2019-07-11 DIAGNOSIS — Z79899 Other long term (current) drug therapy: Secondary | ICD-10-CM | POA: Insufficient documentation

## 2019-07-11 DIAGNOSIS — Z66 Do not resuscitate: Secondary | ICD-10-CM | POA: Diagnosis not present

## 2019-07-11 DIAGNOSIS — Z515 Encounter for palliative care: Secondary | ICD-10-CM

## 2019-07-11 DIAGNOSIS — Z951 Presence of aortocoronary bypass graft: Secondary | ICD-10-CM | POA: Insufficient documentation

## 2019-07-11 DIAGNOSIS — I4510 Unspecified right bundle-branch block: Secondary | ICD-10-CM | POA: Insufficient documentation

## 2019-07-11 DIAGNOSIS — N400 Enlarged prostate without lower urinary tract symptoms: Secondary | ICD-10-CM | POA: Insufficient documentation

## 2019-07-11 DIAGNOSIS — E785 Hyperlipidemia, unspecified: Secondary | ICD-10-CM | POA: Diagnosis not present

## 2019-07-11 DIAGNOSIS — J90 Pleural effusion, not elsewhere classified: Secondary | ICD-10-CM | POA: Diagnosis not present

## 2019-07-11 DIAGNOSIS — Z85828 Personal history of other malignant neoplasm of skin: Secondary | ICD-10-CM | POA: Diagnosis not present

## 2019-07-11 DIAGNOSIS — Z7982 Long term (current) use of aspirin: Secondary | ICD-10-CM | POA: Diagnosis not present

## 2019-07-11 DIAGNOSIS — I252 Old myocardial infarction: Secondary | ICD-10-CM | POA: Diagnosis not present

## 2019-07-11 DIAGNOSIS — I4821 Permanent atrial fibrillation: Secondary | ICD-10-CM | POA: Insufficient documentation

## 2019-07-11 DIAGNOSIS — I2510 Atherosclerotic heart disease of native coronary artery without angina pectoris: Secondary | ICD-10-CM | POA: Insufficient documentation

## 2019-07-11 DIAGNOSIS — Z7901 Long term (current) use of anticoagulants: Secondary | ICD-10-CM | POA: Insufficient documentation

## 2019-07-11 DIAGNOSIS — Z7984 Long term (current) use of oral hypoglycemic drugs: Secondary | ICD-10-CM | POA: Insufficient documentation

## 2019-07-11 DIAGNOSIS — E871 Hypo-osmolality and hyponatremia: Secondary | ICD-10-CM | POA: Diagnosis not present

## 2019-07-11 DIAGNOSIS — I5043 Acute on chronic combined systolic (congestive) and diastolic (congestive) heart failure: Secondary | ICD-10-CM | POA: Diagnosis not present

## 2019-07-11 DIAGNOSIS — Z20822 Contact with and (suspected) exposure to covid-19: Secondary | ICD-10-CM | POA: Diagnosis not present

## 2019-07-11 DIAGNOSIS — E875 Hyperkalemia: Secondary | ICD-10-CM | POA: Diagnosis present

## 2019-07-11 DIAGNOSIS — I4892 Unspecified atrial flutter: Secondary | ICD-10-CM | POA: Diagnosis not present

## 2019-07-11 DIAGNOSIS — I110 Hypertensive heart disease with heart failure: Secondary | ICD-10-CM | POA: Insufficient documentation

## 2019-07-11 DIAGNOSIS — I4891 Unspecified atrial fibrillation: Secondary | ICD-10-CM | POA: Diagnosis present

## 2019-07-11 LAB — COMPREHENSIVE METABOLIC PANEL
ALT: 37 U/L (ref 0–44)
AST: 47 U/L — ABNORMAL HIGH (ref 15–41)
Albumin: 2.9 g/dL — ABNORMAL LOW (ref 3.5–5.0)
Alkaline Phosphatase: 151 U/L — ABNORMAL HIGH (ref 38–126)
Anion gap: 10 (ref 5–15)
BUN: 56 mg/dL — ABNORMAL HIGH (ref 8–23)
CO2: 23 mmol/L (ref 22–32)
Calcium: 8.6 mg/dL — ABNORMAL LOW (ref 8.9–10.3)
Chloride: 89 mmol/L — ABNORMAL LOW (ref 98–111)
Creatinine, Ser: 1.88 mg/dL — ABNORMAL HIGH (ref 0.61–1.24)
GFR calc Af Amer: 36 mL/min — ABNORMAL LOW (ref >60)
GFR calc non Af Amer: 31 mL/min — ABNORMAL LOW (ref >60)
Glucose, Bld: 154 mg/dL — ABNORMAL HIGH (ref 70–99)
Potassium: 6.7 mmol/L (ref 3.5–5.1)
Sodium: 122 mmol/L — ABNORMAL LOW (ref 135–145)
Total Bilirubin: 0.8 mg/dL (ref 0.3–1.2)
Total Protein: 6.1 g/dL — ABNORMAL LOW (ref 6.5–8.1)

## 2019-07-11 LAB — CBC WITH DIFFERENTIAL/PLATELET
Abs Immature Granulocytes: 0.05 10*3/uL (ref 0.00–0.07)
Basophils Absolute: 0 10*3/uL (ref 0.0–0.1)
Basophils Relative: 0 %
Eosinophils Absolute: 0.1 10*3/uL (ref 0.0–0.5)
Eosinophils Relative: 1 %
HCT: 35.5 % — ABNORMAL LOW (ref 39.0–52.0)
Hemoglobin: 11.8 g/dL — ABNORMAL LOW (ref 13.0–17.0)
Immature Granulocytes: 0 %
Lymphocytes Relative: 7 %
Lymphs Abs: 0.8 10*3/uL (ref 0.7–4.0)
MCH: 29.9 pg (ref 26.0–34.0)
MCHC: 33.2 g/dL (ref 30.0–36.0)
MCV: 89.9 fL (ref 80.0–100.0)
Monocytes Absolute: 0.5 10*3/uL (ref 0.1–1.0)
Monocytes Relative: 4 %
Neutro Abs: 10.6 10*3/uL — ABNORMAL HIGH (ref 1.7–7.7)
Neutrophils Relative %: 88 %
Platelets: 242 10*3/uL (ref 150–400)
RBC: 3.95 MIL/uL — ABNORMAL LOW (ref 4.22–5.81)
RDW: 15.1 % (ref 11.5–15.5)
WBC: 12 10*3/uL — ABNORMAL HIGH (ref 4.0–10.5)
nRBC: 0 % (ref 0.0–0.2)

## 2019-07-11 LAB — SODIUM, URINE, RANDOM: Sodium, Ur: 27 mmol/L

## 2019-07-11 MED ORDER — SODIUM ZIRCONIUM CYCLOSILICATE 10 G PO PACK
10.0000 g | PACK | Freq: Once | ORAL | Status: DC
  Filled 2019-07-11: qty 1

## 2019-07-11 MED ORDER — ACETAMINOPHEN 650 MG RE SUPP: 650.0000 mg | Freq: Four times a day (QID) | RECTAL | Status: DC | PRN

## 2019-07-11 MED ORDER — ASPIRIN EC 81 MG PO TBEC
81.0000 mg | DELAYED_RELEASE_TABLET | Freq: Every day | ORAL | Status: DC
  Administered 2019-07-12: 81 mg via ORAL
  Filled 2019-07-11: qty 1

## 2019-07-11 MED ORDER — APIXABAN 2.5 MG PO TABS
2.5000 mg | ORAL_TABLET | Freq: Two times a day (BID) | ORAL | Status: DC
  Administered 2019-07-12: 2.5 mg via ORAL
  Filled 2019-07-11 (×2): qty 1

## 2019-07-11 MED ORDER — CALCIUM GLUCONATE 10 % IV SOLN
1.0000 g | Freq: Once | INTRAVENOUS | Status: AC
  Administered 2019-07-11: 1 g via INTRAVENOUS
  Filled 2019-07-11: qty 10

## 2019-07-11 MED ORDER — INSULIN ASPART 100 UNIT/ML ~~LOC~~ SOLN
0.0000 [IU] | Freq: Three times a day (TID) | SUBCUTANEOUS | Status: DC
  Administered 2019-07-12: 2 [IU] via SUBCUTANEOUS
  Filled 2019-07-11: qty 1

## 2019-07-11 MED ORDER — DEXTROSE 50 % IV SOLN
1.0000 | Freq: Once | INTRAVENOUS | Status: AC
  Administered 2019-07-11: 50 mL via INTRAVENOUS
  Filled 2019-07-11: qty 50

## 2019-07-11 MED ORDER — SODIUM CHLORIDE 0.9% FLUSH
3.0000 mL | Freq: Two times a day (BID) | INTRAVENOUS | Status: DC
  Administered 2019-07-12 (×2): 3 mL via INTRAVENOUS

## 2019-07-11 MED ORDER — FUROSEMIDE 40 MG PO TABS: 40.0000 mg | ORAL_TABLET | Freq: Two times a day (BID) | ORAL | Status: DC

## 2019-07-11 MED ORDER — FUROSEMIDE 10 MG/ML IJ SOLN
40.0000 mg | Freq: Once | INTRAMUSCULAR | Status: AC
  Administered 2019-07-11: 40 mg via INTRAVENOUS
  Filled 2019-07-11: qty 4

## 2019-07-11 MED ORDER — FINASTERIDE 5 MG PO TABS
5.0000 mg | ORAL_TABLET | Freq: Every day | ORAL | Status: DC
  Administered 2019-07-12: 5 mg via ORAL
  Filled 2019-07-11: qty 1

## 2019-07-11 MED ORDER — ONDANSETRON HCL 4 MG PO TABS: 4.0000 mg | ORAL_TABLET | Freq: Four times a day (QID) | ORAL | Status: DC | PRN

## 2019-07-11 MED ORDER — INSULIN ASPART 100 UNIT/ML ~~LOC~~ SOLN
SUBCUTANEOUS | Status: AC
  Filled 2019-07-11: qty 1

## 2019-07-11 MED ORDER — INSULIN ASPART 100 UNIT/ML IV SOLN
5.0000 [IU] | Freq: Once | INTRAVENOUS | Status: AC
  Administered 2019-07-11: 5 [IU] via INTRAVENOUS
  Filled 2019-07-11: qty 0.05

## 2019-07-11 MED ORDER — SODIUM BICARBONATE 8.4 % IV SOLN
50.0000 meq | Freq: Once | INTRAVENOUS | Status: AC
  Administered 2019-07-11: 50 meq via INTRAVENOUS
  Filled 2019-07-11: qty 50

## 2019-07-11 MED ORDER — ONDANSETRON HCL 4 MG/2ML IJ SOLN: 4.0000 mg | Freq: Four times a day (QID) | INTRAMUSCULAR | Status: DC | PRN

## 2019-07-11 MED ORDER — ACETAMINOPHEN 325 MG PO TABS: 650.0000 mg | ORAL_TABLET | Freq: Four times a day (QID) | ORAL | Status: DC | PRN

## 2019-07-11 MED ORDER — DOXYCYCLINE HYCLATE 100 MG PO TABS
100.0000 mg | ORAL_TABLET | Freq: Two times a day (BID) | ORAL | Status: DC
  Administered 2019-07-12: 100 mg via ORAL
  Filled 2019-07-11: qty 1

## 2019-07-11 MED ORDER — SIMVASTATIN 10 MG PO TABS
20.0000 mg | ORAL_TABLET | Freq: Every day | ORAL | Status: DC
  Administered 2019-07-12: 20 mg via ORAL
  Filled 2019-07-11: qty 2

## 2019-07-11 NOTE — ED Notes (Signed)
Called Hospice at [phone] for consult. Pending call back

## 2019-07-11 NOTE — ED Triage Notes (Signed)
Pt arrived via EMS from home with report of high K+ from previous labs. Pt is poor historian. Pts Bp is 80/50.

## 2019-07-11 NOTE — ED Provider Notes (Signed)
[facility] Emergency Department Provider Note  ____________________________________________   First MD Initiated Contact with Patient 07/11/19 1958     (approximate)  I have reviewed the triage vital signs and the nursing notes.   HISTORY  Chief Complaint Hypotension    HPI Joseph Flowers is a 86 y.o. male . fib status post atrial ablation on Eliquis, dCHF, DM, CAD s/p CABG remote, and HTN who comes in with concerns for abnormal lab.  I discussed with patient's son who stated that patient was told that he has 2 months to 6 months to live due to his severe heart failure with EF of less than 20%.  He has hospice nurses that come into the home as well as [place] nurses.  It sounds like a nurse was concerned about a wound on his foot.  They took patient to the primary care doctor who got some routine labs.  They switched him over from Bactrim to doxycycline due to concern that it was not healing.  His creatinine was 1.9 his potassium was 6.4.  His sodium level was 123 and his chloride level was 87.  Due to these abnormalities they sent him to the ER to be evaluated.  According to son he was under the impression that prior to having patient transferred to the hospital they were supposed to talk to the hospice team which was never done.  According to the patient he states that his leg swelling has gone down greatly.  He denies any chest pain states that he has always has some shortness of breath and does not really seem to be any different than his normal.  He states that he is only here because he was told that he has an abnormal lab.  Reviewed patient's prior blood pressures and last blood pressure in April was 113/74          Past Medical History:  Diagnosis Date  . Atrial fibrillation ([place])    a. first diagnosed 06/2015; b. CHADS2VASc => 6 (CHF, HTN, age x 2, DM, vascualr disease)  . Basal cell carcinoma (BCC)    left leg and nose  . 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 Coronary artery disease involving native heart without angina pectoris 2002   s/p CABG x 5 (Dr. Roxy Manns  . Diabetes mellitus without complication ([place])   . Diastolic CHF ([place]) 99991111  . HOH (hard of hearing)   . Hyperlipemia   . Hypertension   . Myocardial infarction [facility])    per daughter Donita Brooks  . S/P CABG x 5 2002   Dr. Roxy Manns  . Wears glasses     Patient Active Problem List   Diagnosis Date Noted  . Acute on chronic congestive heart failure ([place])   . Goals of care, counseling/discussion   . Palliative care by specialist   . Encounter for hospice care discussion   . DNR (do not resuscitate) discussion   . Acute CHF (congestive heart failure) ([place]) 06/03/2019  . Basal cell carcinoma of face 06/15/2018  . Basal cell carcinoma of leg 06/15/2018  . Lymphedema 11/02/2016  . Chronic venous insufficiency 11/02/2016  . PAD (peripheral artery disease) ([place]) 09/25/2016  . Leg pain 09/25/2016  . 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 Benign fibroma of prostate 08/02/2015  . Controlled type 2 diabetes mellitus without complication ([place]) [id number]  . H/O renal calculi 08/02/2015  . HLD (hyperlipidemia) 08/02/2015  .  BP (high blood pressure) 08/02/2015  . Adult hypothyroidism 08/02/2015  . Myocardial infarction ([place]) 08/02/2015  . SOB (shortness of breath)   . Atrial fibrillation ([place])   . Coronary artery disease involving native heart without angina pectoris   . S/P CABG x 5   . Diastolic CHF ([place])   . Postpyloric ulcer   . Esophagitis, unspecified   . Acquired hypertrophic pyloric stenosis   . Cholangitis 07/14/2015  . Calculus of gallbladder with biliary obstruction but without cholecystitis   . H/O neoplasm 06/25/2015  . Bowel obstruction ([place])   . SBO (small bowel obstruction) ([place])   . Intestinal obstruction due to adhesions ([place]) 08/18/2014  . CAD in native artery 03/22/2014  . Difficult or painful  urination 02/02/2012  . Incomplete bladder emptying 02/02/2012  . Flu vaccine need 02/02/2012  . Excessive urination at night 02/02/2012  . Benign prostatic hyperplasia with urinary obstruction 02/02/2012  . Symptoms involving urinary system 02/02/2012    Past Surgical History:  Procedure Laterality Date  . BASAL CELL CARCINOMA EXCISION N/A 07/12/2018   Procedure: Excision of Basal Cell Carcinoma of nose;  Surgeon: Wallace Going, DO;  Location: [place];  Service: Plastics;  Laterality: N/A;  . CORONARY ARTERY BYPASS GRAFT    . ENDOSCOPIC RETROGRADE CHOLANGIOPANCREATOGRAPHY (ERCP) WITH PROPOFOL N/A 07/16/2015   Procedure: ENDOSCOPIC RETROGRADE CHOLANGIOPANCREATOGRAPHY (ERCP) WITH PROPOFOL;  Surgeon: Lucilla Lame, MD;  Location: [facility] ENDOSCOPY;  Service: Endoscopy;  Laterality: N/A;  . EXCISION MASS LOWER EXTREMETIES Left 07/12/2018   Procedure: Excision of Basal Cell Carcinoma of left leg with Acell Placement;  Surgeon: Wallace Going, DO;  Location: [place];  Service: Plastics;  Laterality: Left;    Prior to Admission medications   Medication Sig Start Date End Date Taking? Authorizing Provider  apixaban (ELIQUIS) 5 MG TABS tablet Take 1 tablet (5 mg total) by mouth 2 (two) times daily. 06/09/19 07/09/19  Val Riles, MD  aspirin EC 81 MG tablet Take 1 tablet (81 mg total) by mouth daily. 06/09/19   Val Riles, MD  finasteride (PROSCAR) 5 MG tablet Take 5 mg by mouth daily.  06/12/14   [provider]  furosemide (LASIX) 40 MG tablet Take 1 tablet (40 mg total) by mouth 2 (two) times daily. 06/09/19 07/09/19  Val Riles, MD  metFORMIN (GLUCOPHAGE) 500 MG tablet Take 500 mg by mouth daily with breakfast.  08/11/14   [provider]  metoprolol succinate (TOPROL-XL) 50 MG 24 hr tablet Take 1 tablet (50 mg total) by mouth daily. Take with or immediately following a meal. 06/09/19   Val Riles, MD  ramipril (ALTACE) 2.5 MG capsule Take 1 capsule (2.5 mg total) by  mouth daily. 06/09/19 07/09/19  Val Riles, MD  SANTYL ointment APPLY TO LEFT LEG EVERY OTHER DAY 10/23/18   [provider]  simvastatin (ZOCOR) 20 MG tablet Take 20 mg by mouth at bedtime.  08/11/14   [provider]    Allergies Patient has no known allergies.  Family History  Problem Relation Age of Onset  . Cancer Mother   . Cancer Father     Social History Social History   Tobacco Use  . Smoking status: Never Smoker  . Smokeless tobacco: Never Used  Substance Use Topics  . Alcohol use: No    Alcohol/week: 0.0 standard drinks  . Drug use: No      Review of Systems Constitutional: No fever/chills Eyes: No visual changes. ENT: No sore throat. Cardiovascular: Denies chest  pain. Respiratory: Chronic shortness of breath Gastrointestinal: No abdominal pain.  No nausea, no vomiting.  No diarrhea.  No constipation. Genitourinary: Negative for dysuria. Musculoskeletal: Negative for back pain.  Leg wound, leg swelling Skin: Negative for rash. Neurological: Negative for headaches, focal weakness or numbness. All other ROS negative ____________________________________________   PHYSICAL EXAM:  VITAL SIGNS: ED Triage Vitals [07/11/19 1948]  Enc Vitals Group     BP (!) 82/54     Pulse Rate 69     Resp 18     Temp (!) 97.5 F (36.4 C)     Temp Source Oral     SpO2 98 %     Weight      Height      Head Circumference      Peak Flow      Pain Score      Pain Loc      Pain Edu?      Excl. in [place]?     Constitutional: Alert and oriented x3.  Elderly man.  No distress noted.. Eyes: Conjunctivae are normal. EOMI. Head: Atraumatic. Nose: No congestion/rhinnorhea. Mouth/Throat: Mucous membranes are moist.   Neck: No stridor. Trachea Midline. FROM Cardiovascular: Normal rate, regular rhythm. Grossly normal heart sounds.  Good peripheral circulation. Respiratory: Normal respiratory effort.  No retractions. Lungs CTAB. Gastrointestinal: Soft and  nontender. No distention. No abdominal bruits.  Musculoskeletal: 2-3+ edema in all extremities. Wound to R foot.  Neurologic:  Normal speech and language. No gross focal neurologic deficits are appreciated.  Skin:  Skin is warm, dry and intact. No rash noted. Psychiatric: Mood and affect are normal. Speech and behavior are normal. GU: Deferred   ____________________________________________   LABS (all labs ordered are listed, but only abnormal results are displayed)  Labs Reviewed  CBC WITH DIFFERENTIAL/PLATELET - Abnormal; Notable for the following components:      Result Value   WBC 12.0 (*)    RBC 3.95 (*)    Hemoglobin 11.8 (*)    HCT 35.5 (*)    Neutro Abs 10.6 (*)    All other components within normal limits  COMPREHENSIVE METABOLIC PANEL - Abnormal; Notable for the following components:   Sodium 122 (*)    Potassium 6.7 (*)    Chloride 89 (*)    Glucose, Bld 154 (*)    BUN 56 (*)    Creatinine, Ser 1.88 (*)    Calcium 8.6 (*)    Total Protein 6.1 (*)    Albumin 2.9 (*)    AST 47 (*)    Alkaline Phosphatase 151 (*)    GFR calc non Af Amer 31 (*)    GFR calc Af Amer 36 (*)    All other components within normal limits  CBC - Abnormal; Notable for the following components:   WBC 11.7 (*)    RBC 3.81 (*)    Hemoglobin 11.5 (*)    HCT 33.0 (*)    All other components within normal limits  BASIC METABOLIC PANEL - Abnormal; Notable for the following components:   Sodium 125 (*)    Potassium 6.2 (*)    Chloride 87 (*)    Glucose, Bld 118 (*)    BUN 59 (*)    Creatinine, Ser 1.99 (*)    GFR calc non Af Amer 29 (*)    GFR calc Af Amer 34 (*)    All other components within normal limits  SARS CORONAVIRUS 2 BY RT PCR (HOSPITAL ORDER, PERFORMED IN  [facility])  SODIUM, URINE, RANDOM   ____________________________________________   ED ECG REPORT I, Vanessa [place], the attending physician, personally viewed and interpreted this ECG.  EKG is atrial  flutter with variable block with right bundle branch block and some T wave inversions that looks similar to prior EKG and some ST elevation in lead III but also looks similar ____________________________________________  RADIOLOGY I, Vanessa [place], personally viewed and evaluated these images (plain radiographs) as part of my medical decision making, as well as reviewing the written report by the radiologist.  ED MD interpretation:  Diffuse bilateral edema  Official radiology report(s): DG Chest Portable 1 View  Result Date: 07/11/2019 CLINICAL DATA:  Shortness of breath EXAM: PORTABLE CHEST 1 VIEW COMPARISON:  06/04/2019 FINDINGS: Diffuse bilateral interstitial and heterogeneous airspace opacity with small bilateral pleural effusions. Cardiomegaly status post median sternotomy and CABG. The visualized skeletal structures are unremarkable. IMPRESSION: 1. Diffuse bilateral interstitial and heterogeneous airspace opacity with small bilateral pleural effusions, consistent with edema or infection. 2. Cardiomegaly status post median sternotomy and CABG. Electronically Signed   By: Eddie Candle M.D.   On: 07/11/2019 20:26    ____________________________________________   PROCEDURES  Procedure(s) performed (including Critical Care):  .Critical Care Performed by: Vanessa Garner, MD Authorized by: Vanessa [place], MD   Critical care provider statement:    Critical care time (minutes):  45   Critical care was necessary to treat or prevent imminent or life-threatening deterioration of the following conditions:  Cardiac failure and renal failure   Critical care was time spent personally by me on the following activities:  Discussions with consultants, evaluation of patient's response to treatment, examination of patient, ordering and performing treatments and interventions, ordering and review of laboratory studies, ordering and review of radiographic studies, pulse oximetry, re-evaluation of patient's  condition, obtaining history from patient or surrogate and review of old charts     ____________________________________________   INITIAL IMPRESSION / [place] / ED COURSE  Joseph Flowers was evaluated in Emergency Department on 07/11/2019 for the symptoms described in the history of present illness. He was evaluated in the context of the global COVID-19 pandemic, which necessitated consideration that the patient might be at risk for infection with the SARS-CoV-2 virus that causes COVID-19. Institutional protocols and algorithms that pertain to the evaluation of patients at risk for COVID-19 are in a state of rapid change based on information released by regulatory bodies including the CDC and federal and state organizations. These policies and algorithms were followed during the patient's care in the ED.    Pt comes in with significant lab abnormalities AKI, hypona, hyperK and given physical exam I am most concerned this is secondary to his severe HF EF <20% due to pulm edema on xray and significant swelling in his extrem leading to renal failure.  Given hospice pt will d/w family goals of care to understand next step.  Extensive discussion with POA son who understands that a fix is temporary given we cant fix the underlying issue his heart. At this time son would like to have pt admitted for temporary measure to fix hyperK. Did confirm DNR.  Lower suspsicion for sepsis.  Not requiring oxygen at this time to suggest respiratory failure.  Will give bicarb, ca, insulin and lasix to address.  D/w hospital for admission.       ____________________________________________   FINAL CLINICAL IMPRESSION(S) / ED DIAGNOSES   Final diagnoses:  Hyperkalemia  Hyponatremia  AKI (acute kidney injury) ([place])  Acute on chronic heart failure, unspecified heart failure type ([place])      MEDICATIONS GIVEN DURING THIS VISIT:  Medications  sodium chloride flush (NS) 0.9 % injection 3 mL (3  mLs Intravenous Given 07/12/19 0617)  acetaminophen (TYLENOL) tablet 650 mg (has no administration in time range)    Or  acetaminophen (TYLENOL) suppository 650 mg (has no administration in time range)  ondansetron (ZOFRAN) tablet 4 mg (has no administration in time range)    Or  ondansetron (ZOFRAN) injection 4 mg (has no administration in time range)  apixaban (ELIQUIS) tablet 2.5 mg (2.5 mg Oral Given 07/12/19 0613)  aspirin EC tablet 81 mg (has no administration in time range)  finasteride (PROSCAR) tablet 5 mg (has no administration in time range)  simvastatin (ZOCOR) tablet 20 mg (20 mg Oral Given 07/12/19 0612)  sodium zirconium cyclosilicate (LOKELMA) packet 10 g (0 g Oral Hold 07/12/19 0613)  insulin aspart (novoLOG) injection 0-9 Units (has no administration in time range)  furosemide (LASIX) tablet 40 mg (40 mg Oral Not Given 07/12/19 0728)  doxycycline (VIBRA-TABS) tablet 100 mg (has no administration in time range)  calcium gluconate inj 10% (1 g) URGENT USE ONLY! (1 g Intravenous Given 07/11/19 2203)  insulin aspart (novoLOG) injection 5 Units (5 Units Intravenous Given 07/11/19 2204)  sodium bicarbonate injection 50 mEq (50 mEq Intravenous Given 07/11/19 2203)  furosemide (LASIX) injection 40 mg (40 mg Intravenous Given 07/11/19 2203)  dextrose 50 % solution 50 mL (50 mLs Intravenous Given 07/11/19 2202)     ED Discharge Orders    None       Note:  This document was prepared using Dragon voice recognition software and may include unintentional dictation errors.   Vanessa [place], MD 07/12/19 [phone]

## 2019-07-11 NOTE — H&P (Signed)
History and Physical    Joseph Flowers KGY:[id number] DOB: January 14, 1928 DOA: 07/11/2019  PCP: Baxter Hire, MD  Patient coming from: Home via EMS  I have personally briefly reviewed patient's old medical records in [place]  Chief Complaint: Abnormal labs  HPI: Joseph Flowers is a 86 y.o. male with medical history significant for chronic combined systolic and diastolic CHF (EF <02 percent), CAD s/p CABG x5, permanent atrial fibrillation on Eliquis, type 2 diabetes, hypertension, hyperlipidemia, BPH, who is currently under hospice care presents to the ED for evaluation of abnormal labs.  History is supplemented by patient's son by phone.  Patient is currently under hospice care.  He has been on antibiotics with Bactrim for right foot wound treatment.  Home health nursing felt the wound was worsening and he was seen by his PCP.  Bactrim was switched to doxycycline and lab work was obtained which revealed multiple abnormalities including potassium 6.4, sodium 123, BUN 52, creatinine 1.9, WBC 12.  Patient was sent to the ED for further evaluation.  He currently denies any pain.  He feels his breathing is slightly worse than his baseline.  He states he has pain in both of his feet when he bears weight but otherwise currently has no other complaints.  ED Course:  Initial vitals showed BP 82/54, pulse 69, RR 18, temp 97.5 Fahrenheit, SPO2 98% on room air.  Labs notable for sodium 122, potassium 6.7, bicarb 23, BUN 56, creatinine 1.88, AST 47, ALT 37, alk phos 151, total bilirubin 0.8, albumin 2.9.  Portable chest x-ray showed diffuse bilateral interstitial and heterogeneous airspace opacity with small bilateral pleural effusions.  Prior sternotomy and CABG changes are noted.  Patient was given IV Lasix 40 mg x 1.  Also given 1 amp sodium bicarb, 5 units NovoLog with 1 amp D50, and calcium gluconate.  EDP discussed with patient's son who confirms patient is DNR status and would like  temporizing measures for now but low threshold to pursue palliative care pending clinical progress.  The hospitalist service was consulted to admit for further evaluation management.  Review of Systems: All systems reviewed and are negative except as documented in history of present illness above.   Past Medical History:  Diagnosis Date  . Atrial fibrillation ([place])    a. first diagnosed 06/2015; b. CHADS2VASc => 6 (CHF, HTN, age x 2, DM, vascualr disease)  . Basal cell carcinoma (BCC)    left leg and nose  . 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 Coronary artery disease involving native heart without angina pectoris 2002   s/p CABG x 5 (Dr. Roxy Manns  . Diabetes mellitus without complication ([place])   . Diastolic CHF ([place]) 6378  . HOH (hard of hearing)   . Hyperlipemia   . Hypertension   . Myocardial infarction [facility])    per daughter Donita Brooks  . S/P CABG x 5 2002   Dr. Roxy Manns  . Wears glasses     Past Surgical History:  Procedure Laterality Date  . BASAL CELL CARCINOMA EXCISION N/A 07/12/2018   Procedure: Excision of Basal Cell Carcinoma of nose;  Surgeon: Wallace Going, DO;  Location: [facility];  Service: Plastics;  Laterality: N/A;  . CORONARY ARTERY BYPASS GRAFT    . ENDOSCOPIC RETROGRADE CHOLANGIOPANCREATOGRAPHY (ERCP) WITH PROPOFOL N/A 07/16/2015   Procedure: ENDOSCOPIC RETROGRADE CHOLANGIOPANCREATOGRAPHY (ERCP) WITH PROPOFOL;  Surgeon: Lucilla Lame, MD;  Location: [facility] ENDOSCOPY;  Service: Endoscopy;  Laterality: N/A;  .  EXCISION MASS LOWER EXTREMETIES Left 07/12/2018   Procedure: Excision of Basal Cell Carcinoma of left leg with Acell Placement;  Surgeon: Wallace Going, DO;  Location: [facility];  Service: Plastics;  Laterality: Left;    Social History:  reports that he has never smoked. He has never used smokeless tobacco. He reports that he does not drink alcohol or use drugs.  No Known Allergies  Family History  Problem Relation Age of Onset  . Cancer Mother   .  Cancer Father      Prior to Admission medications   Medication Sig Start Date End Date Taking? Authorizing Provider  apixaban (ELIQUIS) 5 MG TABS tablet Take 1 tablet (5 mg total) by mouth 2 (two) times daily. 06/09/19 07/11/19 Yes Val Riles, MD  aspirin EC 81 MG tablet Take 1 tablet (81 mg total) by mouth daily. 06/09/19  Yes Val Riles, MD  finasteride (PROSCAR) 5 MG tablet Take 5 mg by mouth daily.  06/12/14  Yes [provider]  furosemide (LASIX) 40 MG tablet Take 1 tablet (40 mg total) by mouth 2 (two) times daily. 06/09/19 07/11/19 Yes Val Riles, MD  glimepiride (AMARYL) 2 MG tablet TAKE 1 TABLET BY MOUTH, ALONG WITH 4MG TABLET, ONCE DAILY WITH BREAKFAST 05/06/19  Yes [provider]  metFORMIN (GLUCOPHAGE) 500 MG tablet Take 500 mg by mouth daily with breakfast.  08/11/14  Yes [provider]  metolazone (ZAROXOLYN) 5 MG tablet Take 5 mg by mouth every morning. 07/06/19  Yes [provider]  ramipril (ALTACE) 2.5 MG capsule Take 1 capsule (2.5 mg total) by mouth daily. 06/09/19 07/11/19 Yes Val Riles, MD  simvastatin (ZOCOR) 20 MG tablet Take 20 mg by mouth at bedtime.  08/11/14  Yes [provider]  sulfamethoxazole-trimethoprim (BACTRIM DS) 800-160 MG tablet Take 1 tablet by mouth 2 (two) times daily. 07/08/19  Yes [provider]    Physical Exam: Vitals:   07/11/19 2030 07/11/19 2130 07/11/19 2145 07/11/19 2200  BP: (!) 89/66 106/84  112/75  Pulse: 79 86  86  Resp: 17 18 19  (!) 21  Temp:      TempSrc:      SpO2: 97% 97%  98%   Constitutional: Chronically ill-appearing elderly man resting in bed, NAD, calm, comfortable Eyes: PERRL, lids and conjunctivae normal ENMT: Mucous membranes are moist. Posterior pharynx clear of any exudate or lesions. Neck: normal, supple, no masses. Respiratory: Bibasilar crackles.  Normal respiratory effort. No accessory muscle use.  Cardiovascular: Irregularly irregular, murmur present.   +1 bilateral lower extremity edema. Abdomen: Distended but not tense abdomen, no tenderness, no masses palpated. No hepatosplenomegaly. Musculoskeletal: no clubbing / cyanosis. No joint deformity upper and lower extremities. Nno contractures. Normal muscle tone.  Skin: Bilateral feet with wound wrapping in place, has dorsal ulceration of the right foot.  Bruising in various stages of healing on extremities. Neurologic: CN 2-12 grossly intact. Sensation intact, moving all extremities equally while in bed. Psychiatric: Alert and oriented x 3. Normal mood.   Labs on Admission: I have personally reviewed following labs and imaging studies  CBC: Recent Labs  Lab 07/11/19 2011  WBC 12.0*  NEUTROABS 10.6*  HGB 11.8*  HCT 35.5*  MCV 89.9  PLT 401   Basic Metabolic Panel: Recent Labs  Lab 07/11/19 2011  NA 122*  K 6.7*  CL 89*  CO2 23  GLUCOSE 154*  BUN 56*  CREATININE 1.88*  CALCIUM 8.6*   GFR: CrCl cannot be calculated (Unknown  ideal weight.). Liver Function Tests: Recent Labs  Lab 07/11/19 2011  AST 47*  ALT 37  ALKPHOS 151*  BILITOT 0.8  PROT 6.1*  ALBUMIN 2.9*   No results for input(s): LIPASE, AMYLASE in the last 168 hours. No results for input(s): AMMONIA in the last 168 hours. Coagulation Profile: No results for input(s): INR, PROTIME in the last 168 hours. Cardiac Enzymes: No results for input(s): CKTOTAL, CKMB, CKMBINDEX, TROPONINI in the last 168 hours. BNP (last 3 results) No results for input(s): PROBNP in the last 8760 hours. HbA1C: No results for input(s): HGBA1C in the last 72 hours. CBG: No results for input(s): GLUCAP in the last 168 hours. Lipid Profile: No results for input(s): CHOL, HDL, LDLCALC, TRIG, CHOLHDL, LDLDIRECT in the last 72 hours. Thyroid Function Tests: No results for input(s): TSH, T4TOTAL, FREET4, T3FREE, THYROIDAB in the last 72 hours. Anemia Panel: No results for input(s): VITAMINB12, FOLATE, FERRITIN, TIBC, IRON, RETICCTPCT  in the last 72 hours. Urine analysis:    Component Value Date/Time   COLORURINE YELLOW (A) 07/14/2015 1035   APPEARANCEUR CLEAR (A) 07/14/2015 1035   LABSPEC 1.026 07/14/2015 1035   PHURINE 5.0 07/14/2015 1035   GLUCOSEU >500 (A) 07/14/2015 1035   HGBUR 1+ (A) 07/14/2015 1035   BILIRUBINUR NEGATIVE 07/14/2015 1035   KETONESUR 1+ (A) 07/14/2015 1035   PROTEINUR 30 (A) 07/14/2015 1035   NITRITE NEGATIVE 07/14/2015 1035   LEUKOCYTESUR NEGATIVE 07/14/2015 1035    Radiological Exams on Admission: DG Chest Portable 1 View  Result Date: 07/11/2019 CLINICAL DATA:  Shortness of breath EXAM: PORTABLE CHEST 1 VIEW COMPARISON:  06/04/2019 FINDINGS: Diffuse bilateral interstitial and heterogeneous airspace opacity with small bilateral pleural effusions. Cardiomegaly status post median sternotomy and CABG. The visualized skeletal structures are unremarkable. IMPRESSION: 1. Diffuse bilateral interstitial and heterogeneous airspace opacity with small bilateral pleural effusions, consistent with edema or infection. 2. Cardiomegaly status post median sternotomy and CABG. Electronically Signed   By: Eddie Candle M.D.   On: 07/11/2019 20:26    EKG: Independently reviewed. A. fib, RBBB.  Similar to prior.  Assessment/Plan Principal Problem:   Hyperkalemia Active Problems:   Atrial fibrillation (HCC)   Coronary artery disease involving native heart without angina pectoris   Controlled type 2 diabetes mellitus without complication ([place])   Hospice care patient   AKI (acute kidney injury) ([place])   Hyponatremia   Acute on chronic combined systolic and diastolic CHF (congestive heart failure) (HCC)  Joseph Flowers is a 86 y.o. male with medical history significant for chronic combined systolic and diastolic CHF (EF <66 percent), CAD s/p CABG x5, permanent atrial fibrillation on Eliquis, type 2 diabetes, hypertension, hyperlipidemia, BPH, who is currently under hospice care is admitted for management of  hyperkalemia.  Hyperkalemia: Noted on outside labs, 6.7 on repeat check in the ED.  Has received treatment with calcium gluconate, insulin/D50, and 1 amp sodium bicarb.  Also given IV Lasix in setting of volume overload. -Add Lokelma 10 mg once tonight -Repeat labs in a.m.  Acute kidney injury: Likely multifactorial from cardiorenal syndrome and recent Bactrim use.3 -DC Bactrim, switched to doxycycline -Given IV Lasix 40 mg once, resume home Lasix 40 mg orally twice daily tomorrow -Strict I/O's, daily weights -Hold home ramipril and Metformin  Hyponatremia: Likely hypervolemic hyponatremia.  Given IV Lasix 40 mg x 1.  Repeat labs in a.m. however would not aggressively treat further as patient's son would like him to return home with hospice as soon as possible.  Acute on chronic combined systolic and diastolic CHF: End-stage heart failure with EF <20% now on hospice care.  Remains volume overloaded.  Given IV Lasix in the ED.  Resume home Lasix tomorrow morning with likely DC to home with hospice.  Right foot wound: Switched Bactrim to doxycycline as above.  Permanent atrial fibrillation: Chronic and stable.  Continuing home Eliquis.  CAD s/p CABG: Denies any chest pain.  Continue Eliquis, aspirin, statin.  Type 2 diabetes: Metformin and glimepiride on hold.  Placed on sensitive SSI.  Hypertension: BP low on admission.  Holding ramipril due to low BP/AKI.  Resume home Lasix tomorrow.  Hyperlipidemia: Continue simvastatin.  BPH: Continue finasteride.  Hospice care patient: Patient active with hospice.  Discussed with son, he would like to bring his father back home tomorrow if able with hospice care and avoid further hospital visits, focusing on comfort and quality of life.  Patient is DNR.  DVT prophylaxis: Eliquis Code Status: DNR, confirmed with patient's son Family Communication: Discussed with patient's son, Zayyan, Mullen., by phone [phone]. Disposition  Plan: From home, likely discharge to home tomorrow with continued hospice services Consults called: None Admission status:  Status is: Observation  The patient remains OBS appropriate and will d/c before 2 midnights.  Dispo: The patient is from: Home              Anticipated d/c is to: Home with continued home hospice services.              Anticipated d/c date is: 1 day              Patient currently is not medically stable to d/c.   Zada Finders MD [facility]  If 7PM-7AM, please contact night-coverage [email or web address]  07/11/2019, 10:35 PM

## 2019-07-11 NOTE — ED Notes (Signed)
Hospice states patient is a DNR however no particular care plan noted for hyperkalemia. Funke MD made aware.

## 2019-07-12 DIAGNOSIS — N179 Acute kidney failure, unspecified: Secondary | ICD-10-CM | POA: Diagnosis not present

## 2019-07-12 DIAGNOSIS — I509 Heart failure, unspecified: Secondary | ICD-10-CM

## 2019-07-12 DIAGNOSIS — E875 Hyperkalemia: Secondary | ICD-10-CM | POA: Diagnosis not present

## 2019-07-12 DIAGNOSIS — E871 Hypo-osmolality and hyponatremia: Secondary | ICD-10-CM | POA: Diagnosis not present

## 2019-07-12 DIAGNOSIS — Z515 Encounter for palliative care: Secondary | ICD-10-CM

## 2019-07-12 LAB — BASIC METABOLIC PANEL
Anion gap: 10 (ref 5–15)
BUN: 59 mg/dL — ABNORMAL HIGH (ref 8–23)
CO2: 28 mmol/L (ref 22–32)
Calcium: 8.9 mg/dL (ref 8.9–10.3)
Chloride: 87 mmol/L — ABNORMAL LOW (ref 98–111)
Creatinine, Ser: 1.99 mg/dL — ABNORMAL HIGH (ref 0.61–1.24)
GFR calc Af Amer: 34 mL/min — ABNORMAL LOW (ref >60)
GFR calc non Af Amer: 29 mL/min — ABNORMAL LOW (ref >60)
Glucose, Bld: 118 mg/dL — ABNORMAL HIGH (ref 70–99)
Potassium: 6.2 mmol/L — ABNORMAL HIGH (ref 3.5–5.1)
Sodium: 125 mmol/L — ABNORMAL LOW (ref 135–145)

## 2019-07-12 LAB — CBC
HCT: 33 % — ABNORMAL LOW (ref 39.0–52.0)
Hemoglobin: 11.5 g/dL — ABNORMAL LOW (ref 13.0–17.0)
MCH: 30.2 pg (ref 26.0–34.0)
MCHC: 34.8 g/dL (ref 30.0–36.0)
MCV: 86.6 fL (ref 80.0–100.0)
Platelets: 241 10*3/uL (ref 150–400)
RBC: 3.81 MIL/uL — ABNORMAL LOW (ref 4.22–5.81)
RDW: 15.2 % (ref 11.5–15.5)
WBC: 11.7 10*3/uL — ABNORMAL HIGH (ref 4.0–10.5)
nRBC: 0 % (ref 0.0–0.2)

## 2019-07-12 LAB — GLUCOSE, CAPILLARY
Glucose-Capillary: 112 mg/dL — ABNORMAL HIGH (ref 70–99)
Glucose-Capillary: 192 mg/dL — ABNORMAL HIGH (ref 70–99)
Glucose-Capillary: 160 mg/dL — ABNORMAL HIGH (ref 70–99)

## 2019-07-12 LAB — SARS CORONAVIRUS 2 BY RT PCR (HOSPITAL ORDER, PERFORMED IN ~~LOC~~ HOSPITAL LAB): SARS Coronavirus 2: NEGATIVE

## 2019-07-12 MED ORDER — FUROSEMIDE 40 MG PO TABS: 40.0000 mg | ORAL_TABLET | Freq: Two times a day (BID) | ORAL | Status: DC

## 2019-07-12 MED ORDER — MORPHINE SULFATE (CONCENTRATE) 10 MG /0.5 ML PO SOLN: 5.0000 mg | ORAL | 0 refills | Status: AC | PRN

## 2019-07-12 MED ORDER — DOXYCYCLINE HYCLATE 100 MG PO TABS: 100.0000 mg | ORAL_TABLET | Freq: Two times a day (BID) | ORAL | Status: DC

## 2019-07-12 MED ORDER — LORAZEPAM 0.5 MG PO TABS
0.5000 mg | ORAL_TABLET | Freq: Three times a day (TID) | ORAL | 0 refills | Status: AC | PRN
Start: 2019-07-12 — End: ?

## 2019-07-12 NOTE — ED Notes (Signed)
Per dr Manuella Ghazi held lasix r/t BP.

## 2019-07-12 NOTE — ED Notes (Signed)
Pt given meal tray by dietary.

## 2019-07-12 NOTE — ED Notes (Addendum)
Pt's BP currently 83/51, pt placed in trendelenburg position. Pt A&O at this time. Pt denies any ill or unusual sx. NP [place] notified.

## 2019-07-12 NOTE — Progress Notes (Signed)
Notified of low blood of 81/60 mm Hg asymptomatic.  Patient assessed a the bedside. He was afebrile with RECHECK blood pressure 82/57 (66), manual 76/42 mm Hg and pulse rate 95 beats/min. There were no focal neurological deficits; he was alert and oriented x4, and he did not demonstrate any memory deficits.   Given that he is asymptomatic and admitted with fluid overload secondary to CHF will hold giving IVFs at this time. Will also hold am Lasix for low BP.    Rufina Falco, DNP, CCRN, APRN, FNP-BC [facility] Nurse Practitioner

## 2019-07-12 NOTE — ED Notes (Signed)
Pt alert, NAD.  BP low, paging dr Manuella Ghazi to inform and discuss holding lasix.  Pt oriented to person and place.  Dressing to both feet bilateral.  Unlabored. Generalized pitting edema.  Male external catheter on.

## 2019-07-12 NOTE — ED Notes (Signed)
Pt tray set up and pt adjusted in bed to eat. Remains to wait on EMS transport.  No other needs at this time.

## 2019-07-12 NOTE — Progress Notes (Signed)
[facility] hospital liaison ED visit note: [name] is currently followed by [facility] hospice services at home with a hospice diagnosis of hypertensive heart disease. He is a DNR code with out of facility DNR in place in the home. He was sent to the [facility] ED last evening by his primary care physician for evaluation of abnormal labs, noteably elevated potassium.  He was seen and evaluated with plan for admission. Per writers discussion with staff RN Mateo Flow, attending physician Dr. Manuella Ghazi had a long conversation with Mr. [name] and his son [name] regarding his declining condition and multiple medical problems. Plan is now for discharge home to continue hospice services, focusing on symptom management and comfort.  Attending physician also spoke with patient's hospice RN via telephone. Patient seen lying on the ED stretcher with eyes closed, easily awakened by voice. [name] was no longer at the bedside.  Mr. Staebell was able to report what he heard from Dr. Manuella Ghazi and the plan for returning home.  Mr. [name] engaged in a life review, talking about his service in the [employer], his past jobs and his family. He also confirmed that he does have a hospital bed in place in his home as well as a caregiver. EMS has been called by staff RN Mateo Flow. Hospice team updated to discharge plan.  VS: T 98.1, 92, 86/68, 100% on room air Abnormal labs: Na 125, K 6.2, Glucose 118, Bun 59, Creat 1.99, GFR 29, WBC 11.7, Hb 11.5  Epic Imaging: CLINICAL DATA:  Shortness of breath  EXAM: PORTABLE CHEST 1 VIEW COMPARISON:  06/04/2019 IMPRESSION: 1. Diffuse bilateral interstitial and heterogeneous airspace opacity with small bilateral pleural effusions, consistent with edema or infection. 2. Cardiomegaly status post median sternotomy and CABG. Electronically Signed By: Eddie Candle M.D. On: 07/11/2019 20:26  Epic notes: from H+P HPI: Joseph Flowers is a 86 y.o. male with medical history significant  for chronic combined systolic and diastolic CHF (EF <63 percent), CAD s/p CABG x5, permanent atrial fibrillation on Eliquis, type 2 diabetes, hypertension, hyperlipidemia, BPH, who is currently under hospice care presents to the ED for evaluation of abnormal labs.  History is supplemented by patient's son by phone.  Patient is currently under hospice care.  He has been on antibiotics with Bactrim for right foot wound treatment.  Home health nursing felt the wound was worsening and he was seen by his PCP.  Bactrim was switched to doxycycline and lab work was obtained which revealed multiple abnormalities including potassium 6.4, sodium 123, BUN 52, creatinine 1.9, WBC 12.  Patient was sent to the ED for further evaluation.  He currently denies any pain.  He feels his breathing is slightly worse than his baseline.  He states he has pain in both of his feet when he bears weight but otherwise currently has no other complaints.  ED Course:  Initial vitals showed BP 82/54, pulse 69, RR 18, temp 97.5 Fahrenheit, SPO2 98% on room air.  Labs notable for sodium 122, potassium 6.7, bicarb 23, BUN 56, creatinine 1.88, AST 47, ALT 37, alk phos 151, total bilirubin 0.8, albumin 2.9.  Portable chest x-ray showed diffuse bilateral interstitial and heterogeneous airspace opacity with small bilateral pleural effusions.  Prior sternotomy and CABG changes are noted.  Patient was given IV Lasix 40 mg x 1.  Also given 1 amp sodium bicarb, 5 units NovoLog with 1 amp D50, and calcium gluconate.  EDP discussed with patient's son who confirms patient is DNR status and  would like temporizing measures for now but low threshold to pursue palliative care pending clinical progress.  The hospitalist service was consulted to admit for further evaluation management. Assessment/Plan Principal Problem:   Hyperkalemia Active Problems:   Atrial fibrillation (HCC)   Coronary artery disease involving native heart without angina  pectoris   Controlled type 2 diabetes mellitus without complication ([place])   Hospice care patient   AKI (acute kidney injury) ([place])   Hyponatremia   Acute on chronic combined systolic and diastolic CHF (congestive heart failure) (HCC)  Joseph Flowers is a 86 y.o. male with medical history significant for chronic combined systolic and diastolic CHF (EF <11 percent), CAD s/p CABG x5, permanent atrial fibrillation on Eliquis, type 2 diabetes, hypertension, hyperlipidemia, BPH, who is currently under hospice care is admitted for management of hyperkalemia. Hyperkalemia: Noted on outside labs, 6.7 on repeat check in the ED.  Has received treatment with calcium gluconate, insulin/D50, and 1 amp sodium bicarb.  Also given IV Lasix in setting of volume overload. -Add Lokelma 10 mg once tonight -Repeat labs in a.m. Acute kidney injury: Likely multifactorial from cardiorenal syndrome and recent Bactrim use.3 -DC Bactrim, switched to doxycycline -Given IV Lasix 40 mg once, resume home Lasix 40 mg orally twice daily tomorrow -Strict I/O's, daily weights -Hold home ramipril and Metformin Hyponatremia: Likely hypervolemic hyponatremia.  Given IV Lasix 40 mg x 1.  Repeat labs in a.m. however would not aggressively treat further as patient's son would like him to return home with hospice as soon as possible. Acute on chronic combined systolic and diastolic CHF: End-stage heart failure with EF <20% now on hospice care.  Remains volume overloaded.  Given IV Lasix in the ED.  Resume home Lasix tomorrow morning with likely DC to home with hospice. Right foot wound: Switched Bactrim to doxycycline as above. Permanent atrial fibrillation: Chronic and stable.  Continuing home Eliquis. CAD s/p CABG: Denies any chest pain.  Continue Eliquis, aspirin, statin. Type 2 diabetes: Metformin and glimepiride on hold.  Placed on sensitive SSI. Hypertension: BP low on admission.  Holding ramipril due to low  BP/AKI.  Resume home Lasix tomorrow. Hyperlipidemia: Continue simvastatin. BPH: Continue finasteride. Hospice care patient: Patient active with hospice.  Discussed with son, he would like to bring his father back home tomorrow if able with hospice care and avoid further hospital visits, focusing on comfort and quality of life.  Patient is DNR.  Family Communication: Discussed with patient's son, Robbert, Langlinais., by phone [phone].Zada Finders MD  Flo Shanks BSN, RN, [place] [phone]

## 2019-07-12 NOTE — ED Notes (Signed)
Informed son that pt is on way via EMS.

## 2019-07-12 NOTE — ED Notes (Signed)
Called to check on meal trays, report they are still working on them

## 2019-07-12 NOTE — ED Notes (Signed)
Discharge instructions went over with son [place]. He signed paper discharge form.  Dr Manuella Ghazi spoke with son and hospice and team and on board with pt going home.

## 2019-07-12 NOTE — Discharge Instructions (Signed)
Hospice Hospice is a service that is designed to provide people who are terminally ill and their families with medical, spiritual, and psychological support. Its aim is to improve your quality of life by keeping you as comfortable as possible in the final stages of life. Who will be my providers when I begin hospice care? Hospice teams often include:  A nurse.  A doctor. The hospice doctor will be available for your care, but you can include your regular doctor or nurse practitioner.  A social worker.  A counselor.  A religious leader (such as a chaplain).  A dietitian.  Therapists.  Trained volunteers who can help with care. What services does hospice provide? Hospice services can vary depending on the center or organization. Generally, they include:  Ways to keep you comfortable, such as: ? Providing care in your home or in a home-like setting. ? Working with your family and friends to help meet your needs. ? Allowing you to enjoy the support of loved ones by receiving much of your basic care from family and friends.  Pain relief and symptom management. The staff will supply all necessary medicines and equipment so that you can stay comfortable and alert enough to enjoy the company of your friends and family.  Visits or care from a nurse and doctor. This may include 24-hour on-call services.  Companionship when you are alone.  Allowing you and your family to rest. Hospice staff may do light housekeeping, prepare meals, and run errands.  Counseling. They will make sure your emotional, spiritual, and social needs are being met, as well as those needs of your family members.  Spiritual care. This will be individualized to meet your needs and your family's needs. It may involve: ? Helping you and your family understand the dying process. ? Helping you say goodbye to your family and friends. ? Performing a specific religious ceremony or ritual.  Massage.  Nutrition  therapy.  Physical and occupational therapy.  Short-term inpatient care, if something cannot be managed in the home.  Art or music therapy.  Bereavement support for grieving family members. When should hospice care begin? Most people who use hospice are believed to have less than 6 months to live.  Your family and health care providers can help you decide when hospice services should begin.  If you live longer than 6 months but your condition does not improve, your doctor may be able to approve you for continued hospice care.  If your condition improves, you may discontinue the program. What should I consider before selecting a program? Most hospice programs are run by nonprofit, independent organizations. Some are affiliated with hospitals, nursing homes, or home health care agencies. Hospice programs can take place in your home or at a hospice center, hospital, or skilled nursing facility. When choosing a hospice program, ask the following questions:  What services are available to me?  What services will be offered to my loved ones?  How involved will my loved ones be?  How involved will my health care provider be?  Who makes up the hospice care team? How are they trained or screened?  How will my pain and symptoms be managed?  If my circumstances change, can the services be provided in a different setting, such as my home or in the hospital?  Is the program reviewed and licensed by the state or certified in some other way?  What does it cost? Is it covered by insurance?  If I choose a hospice   center or nursing home, where is the hospice center located? Is it convenient for family and friends?  If I choose a hospice center or nursing home, can my family and friends visit any time?  Will you provide emotional and spiritual support?  Who can my family call with questions? Where can I learn more about hospice? You can learn about existing hospice programs in your area  from your health care providers. You can also read more about hospice online. The websites of the following organizations have helpful information:  [facility] and Pall[facility]): http://www.brown-buchanan.com/  National Association for [facility]): http://massey-hart.com/  Hospice Foundation of [place]): www.hospicefoundation.org  American Cancer Society (ACS): www.cancer.org  Hospice Net: www.hospicenet.org  [employer] [place] (VNAA): www.vnaa.org You may also find more information by contacting the following agencies:  A local agency on aging.  Your local [employer] chapter.  Your state's department of health or social services. Summary  Hospice is a service that is designed to provide people who are terminally ill and their families with medical, spiritual, and psychological support.  Hospice aims to improve your quality of life by keeping you as comfortable as possible in the final stages of life.  Hospice teams often include a doctor, nurse, social worker, counselor, religious leader,dietitian, therapists, and volunteers.  Hospice care generally includes medicine for symptom management, visits from doctors and nurses, physical and occupational therapy, nutrition counseling, spiritual and emotional counseling, caregiver support, and bereavement support for grieving family members.  Hospice programs can take place in your home or at a hospice center, hospital, or skilled nursing facility. This information is not intended to replace advice given to you by your health care provider. Make sure you discuss any questions you have with your health care provider. Document Revised: 11/03/2018 Document Reviewed: 03/04/2016 Elsevier Patient Education  [place].

## 2019-07-12 NOTE — ED Notes (Signed)
Waiting on EMS discharge

## 2019-07-12 NOTE — ED Notes (Addendum)
Pt eating breakfast. Arms elevated on pillows. Son at bedside. No needs at this time.

## 2019-08-25 DEATH — deceased
